# Patient Record
Sex: Male | Born: 1950
Health system: Southern US, Community
[De-identification: ages and names within clinical notes are randomized; demographics above are authoritative.]

## PROBLEM LIST (undated history)

## (undated) DIAGNOSIS — C679 Malignant neoplasm of bladder, unspecified: Secondary | ICD-10-CM

## (undated) DIAGNOSIS — I10 Essential (primary) hypertension: Secondary | ICD-10-CM

## (undated) DIAGNOSIS — F32A Depression, unspecified: Secondary | ICD-10-CM

## (undated) DIAGNOSIS — F419 Anxiety disorder, unspecified: Secondary | ICD-10-CM

## (undated) DIAGNOSIS — C801 Malignant (primary) neoplasm, unspecified: Secondary | ICD-10-CM

## (undated) DIAGNOSIS — F329 Major depressive disorder, single episode, unspecified: Secondary | ICD-10-CM

## (undated) HISTORY — PX: CYST EXCISION: SHX5701

## (undated) HISTORY — PX: ADRENALECTOMY: SHX876

## (undated) HISTORY — DX: Malignant neoplasm of bladder, unspecified: C67.9

---
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
## 1999-10-02 HISTORY — PX: CHOLECYSTECTOMY: SHX55

## 2001-03-19 ENCOUNTER — Ambulatory Visit (HOSPITAL_COMMUNITY): Admission: RE | Admit: 2001-03-19 | Discharge: 2001-03-19 | Payer: Self-pay | Admitting: Internal Medicine

## 2001-03-19 ENCOUNTER — Encounter: Payer: Self-pay | Admitting: Internal Medicine

## 2001-06-06 ENCOUNTER — Encounter: Payer: Self-pay | Admitting: Internal Medicine

## 2001-06-06 ENCOUNTER — Ambulatory Visit (HOSPITAL_COMMUNITY): Admission: RE | Admit: 2001-06-06 | Discharge: 2001-06-06 | Payer: Self-pay | Admitting: Internal Medicine

## 2002-08-20 ENCOUNTER — Ambulatory Visit (HOSPITAL_COMMUNITY): Admission: RE | Admit: 2002-08-20 | Discharge: 2002-08-20 | Payer: Self-pay | Admitting: Internal Medicine

## 2002-08-20 ENCOUNTER — Encounter: Payer: Self-pay | Admitting: Internal Medicine

## 2003-03-11 ENCOUNTER — Encounter: Payer: Self-pay | Admitting: Family Medicine

## 2003-03-11 ENCOUNTER — Ambulatory Visit (HOSPITAL_COMMUNITY): Admission: RE | Admit: 2003-03-11 | Discharge: 2003-03-11 | Payer: Self-pay | Admitting: Family Medicine

## 2003-10-07 ENCOUNTER — Ambulatory Visit (HOSPITAL_COMMUNITY): Admission: RE | Admit: 2003-10-07 | Discharge: 2003-10-07 | Payer: Self-pay | Admitting: Internal Medicine

## 2003-11-05 ENCOUNTER — Ambulatory Visit (HOSPITAL_COMMUNITY): Admission: RE | Admit: 2003-11-05 | Discharge: 2003-11-05 | Payer: Self-pay | Admitting: Neurology

## 2004-01-14 ENCOUNTER — Ambulatory Visit (HOSPITAL_COMMUNITY): Admission: RE | Admit: 2004-01-14 | Discharge: 2004-01-14 | Payer: Self-pay | Admitting: Urology

## 2004-03-12 ENCOUNTER — Emergency Department (HOSPITAL_COMMUNITY): Admission: EM | Admit: 2004-03-12 | Discharge: 2004-03-12 | Payer: Self-pay | Admitting: Emergency Medicine

## 2008-11-16 ENCOUNTER — Ambulatory Visit (HOSPITAL_COMMUNITY): Admission: RE | Admit: 2008-11-16 | Discharge: 2008-11-16 | Payer: Self-pay | Admitting: Internal Medicine

## 2009-03-07 ENCOUNTER — Ambulatory Visit (HOSPITAL_COMMUNITY): Admission: RE | Admit: 2009-03-07 | Discharge: 2009-03-07 | Payer: Self-pay | Admitting: Internal Medicine

## 2009-09-12 ENCOUNTER — Ambulatory Visit (HOSPITAL_COMMUNITY): Admission: RE | Admit: 2009-09-12 | Discharge: 2009-09-12 | Payer: Self-pay | Admitting: Urology

## 2009-10-01 HISTORY — PX: OTHER SURGICAL HISTORY: SHX169

## 2009-11-04 ENCOUNTER — Emergency Department (HOSPITAL_COMMUNITY): Admission: EM | Admit: 2009-11-04 | Discharge: 2009-11-04 | Payer: Self-pay | Admitting: Emergency Medicine

## 2010-03-23 ENCOUNTER — Ambulatory Visit (HOSPITAL_COMMUNITY): Admission: RE | Admit: 2010-03-23 | Discharge: 2010-03-23 | Payer: Self-pay | Admitting: Family Medicine

## 2010-04-05 ENCOUNTER — Ambulatory Visit (HOSPITAL_COMMUNITY): Admission: RE | Admit: 2010-04-05 | Discharge: 2010-04-05 | Payer: Self-pay | Admitting: Family Medicine

## 2010-04-12 ENCOUNTER — Ambulatory Visit (HOSPITAL_COMMUNITY): Admission: RE | Admit: 2010-04-12 | Discharge: 2010-04-12 | Payer: Self-pay | Admitting: Family Medicine

## 2010-04-18 ENCOUNTER — Ambulatory Visit (HOSPITAL_COMMUNITY): Admission: RE | Admit: 2010-04-18 | Discharge: 2010-04-18 | Payer: Self-pay | Admitting: Family Medicine

## 2010-05-11 ENCOUNTER — Ambulatory Visit (HOSPITAL_COMMUNITY): Admission: RE | Admit: 2010-05-11 | Discharge: 2010-05-11 | Payer: Self-pay | Admitting: Urology

## 2010-11-20 ENCOUNTER — Other Ambulatory Visit (HOSPITAL_COMMUNITY): Payer: Self-pay | Admitting: Urology

## 2010-11-20 DIAGNOSIS — E278 Other specified disorders of adrenal gland: Secondary | ICD-10-CM

## 2010-11-23 ENCOUNTER — Ambulatory Visit (HOSPITAL_COMMUNITY)
Admission: RE | Admit: 2010-11-23 | Discharge: 2010-11-23 | Disposition: A | Discharge status: Home / Self Care | Payer: Federal, State, Local not specified - PPO | Source: Ambulatory Visit | Attending: Urology | Admitting: Urology

## 2010-11-23 DIAGNOSIS — E278 Other specified disorders of adrenal gland: Secondary | ICD-10-CM

## 2010-11-23 DIAGNOSIS — N289 Disorder of kidney and ureter, unspecified: Secondary | ICD-10-CM | POA: Insufficient documentation

## 2010-11-23 DIAGNOSIS — E279 Disorder of adrenal gland, unspecified: Secondary | ICD-10-CM | POA: Insufficient documentation

## 2010-11-23 MED ORDER — IOHEXOL 300 MG/ML  SOLN
100.0000 mL | Freq: Once | INTRAMUSCULAR | Status: AC | PRN
  Administered 2010-11-23: 100 mL via INTRAVENOUS

## 2010-12-20 LAB — DIFFERENTIAL
Basophils Relative: 1 % (ref 0–1)
Eosinophils Absolute: 0.2 10*3/uL (ref 0.0–0.7)
Eosinophils Relative: 1 % (ref 0–5)
Lymphocytes Relative: 18 % (ref 12–46)
Lymphs Abs: 1.9 10*3/uL (ref 0.7–4.0)
Monocytes Absolute: 1 10*3/uL (ref 0.1–1.0)
Monocytes Relative: 10 % (ref 3–12)
Neutro Abs: 7.6 10*3/uL (ref 1.7–7.7)
Neutrophils Relative %: 71 % (ref 43–77)

## 2010-12-20 LAB — COMPREHENSIVE METABOLIC PANEL
Albumin: 3.8 g/dL (ref 3.5–5.2)
CO2: 27 mEq/L (ref 19–32)
Creatinine, Ser: 0.93 mg/dL (ref 0.4–1.5)
GFR calc non Af Amer: >60 mL/min (ref >60)
Sodium: 138 mEq/L (ref 135–145)

## 2010-12-20 LAB — URINALYSIS, ROUTINE W REFLEX MICROSCOPIC
Bilirubin Urine: NEGATIVE
Glucose, UA: NEGATIVE mg/dL
Urobilinogen, UA: 2 mg/dL — ABNORMAL HIGH (ref 0.0–1.0)

## 2010-12-20 LAB — CBC
HCT: 52.4 % — ABNORMAL HIGH (ref 39.0–52.0)
MCV: 94.4 fL (ref 78.0–100.0)
Platelets: 284 10*3/uL (ref 150–400)
RBC: 5.55 MIL/uL (ref 4.22–5.81)
RDW: 12.8 % (ref 11.5–15.5)

## 2010-12-20 LAB — URINE MICROSCOPIC-ADD ON

## 2010-12-20 LAB — URINE CULTURE
Colony Count: NO GROWTH
Culture: NO GROWTH

## 2011-10-24 ENCOUNTER — Telehealth: Payer: Self-pay

## 2011-10-24 NOTE — Telephone Encounter (Signed)
LMOM to call.

## 2011-10-30 NOTE — Telephone Encounter (Signed)
LMOM to call.

## 2011-10-30 NOTE — Telephone Encounter (Signed)
Letter mailed for pt to call.  

## 2011-10-31 ENCOUNTER — Other Ambulatory Visit: Payer: Self-pay

## 2011-10-31 ENCOUNTER — Telehealth: Payer: Self-pay

## 2011-10-31 DIAGNOSIS — Z139 Encounter for screening, unspecified: Secondary | ICD-10-CM

## 2011-10-31 NOTE — Telephone Encounter (Signed)
Gastroenterology Pre-Procedure Form  PT SAID HE HAD A COLONOSCOPY AT ABOUT AGE 61. DOES NOT KNOW WHY. IT WAS DONE BY DR. Chrissie Noa [facility]AFFORD IN [place] HE IS NOT HAVING ANY PROBLEMS NOW  Request Date: 10/31/2011        Requesting Physician: McGough     PATIENT INFORMATION:  Chad Butler is a 61 y.o., male (DOB=09-28-1951).  PROCEDURE: Procedure(s) requested: colonoscopy Procedure Reason: screening for colon cancer  PATIENT REVIEW QUESTIONS: The patient reports the following:   1. Diabetes Melitis: no 2. Joint replacements in the past 12 months: no 3. Major health problems in the past 3 months: no 4. Has an artificial valve or MVP:no 5. Has been advised in past to take antibiotics in advance of a procedure like teeth cleaning: no}    MEDICATIONS & ALLERGIES:    Patient reports the following regarding taking any blood thinners:   Plavix? no Aspirin?yes  Coumadin?  no  Patient confirms/reports the following medications:  Current Outpatient Prescriptions  Medication Sig Dispense Refill  . ALPRAZolam (XANAX) 0.5 MG tablet Take 0.5 mg by mouth at bedtime as needed. Pt said he only takes a tablet at bedtime nightly      . aspirin 81 MG tablet Take 81 mg by mouth daily.      . Multiple Vitamin (MULTIVITAMIN) tablet Take 1 tablet by mouth daily.        Patient confirms/reports the following allergies:  Allergies  Allergen Reactions  . Gadolinium      Code: VOM, Desc: n/v with injection, no other reactions noted, Onset Date: [id number]     Patient is appropriate to schedule for requested procedure(s): yes  AUTHORIZATION INFORMATION Primary Insurance:   ID #: Group #:  Pre-Cert / Auth required:  Pre-Cert / Auth #:   Secondary Insurance:   ID #:   Group #:  Pre-Cert / Auth required:  Pre-Cert / Auth #:   No orders of the defined types were placed in this encounter.    SCHEDULE INFORMATION: Procedure has been scheduled as follows:  Date: 11/09/2011                       Time: 12:30 PM  Location: [facility] Short Stay  This Gastroenterology Pre-Precedure Form is being routed to the following provider(s) for review: Jonette Eva, MD

## 2011-10-31 NOTE — Telephone Encounter (Deleted)
Gastroenterology Pre-Procedure Form   PT SAID HE HAD A COLONOSCOPY AT ABOUT AGE 61/ HAS NO IDEA WHY/ DR. Chrissie Noa STA[facility]ORD IN [place].  NOT HAVING ANY PROBLEMS   Request Date: 10/31/2011       Requesting Physician: Dr. Regino Schultze     PATIENT INFORMATION:  Chad Butler is a 61 y.o., male (DOB=02-19-1951).  PROCEDURE: Procedure(s) requested: colonoscopy Procedure Reason: screening for colon cancer  PATIENT REVIEW QUESTIONS: The patient reports the following:   1. Diabetes Melitis: no 2. Joint replacements in the past 12 months: no 3. Major health problems in the past 3 months: no 4. Has an artificial valve or MVP:no 5. Has been advised in past to take antibiotics in advance of a procedure like teeth cleaning: no}    MEDICATIONS & ALLERGIES:    Patient reports the following regarding taking any blood thinners:   Plavix? no Aspirin?yes  Coumadin?  no  Patient confirms/reports the following medications:  No current outpatient prescriptions on file.    Patient confirms/reports the following allergies:  Allergies  Allergen Reactions  . Gadolinium      Code: VOM, Desc: n/v with injection, no other reactions noted, Onset Date: [id number]     Patient is appropriate to schedule for requested procedure(s): yes  AUTHORIZATION INFORMATION Primary Insurance:   ID #:   Group #:  Pre-Cert / Auth required:  Pre-Cert / Auth #:   Secondary Insurance:   ID #:   Group #:  Pre-Cert / Auth required: Pre-Cert / Auth #:   No orders of the defined types were placed in this encounter.    SCHEDULE INFORMATION: Procedure has been scheduled as follows:  Date: 11/09/2011                    Time: 12:30 pm  Location:[facility] Short Stay  This Gastroenterology Pre-Precedure Form is being routed to the following provider(s) for review: Jonette Eva, MD

## 2011-11-01 NOTE — Telephone Encounter (Signed)
MOVI PREP SPLIT DOSING, REGULAR BREAKFAST. CLEAR LIQUIDS AFTER 9 AM.  

## 2011-11-01 NOTE — Telephone Encounter (Signed)
REVIEWED.  

## 2011-11-01 NOTE — Telephone Encounter (Signed)
Rx and instructions faxed to [facility] pharmacy. Pt aware!

## 2011-11-06 ENCOUNTER — Encounter (HOSPITAL_COMMUNITY): Payer: Self-pay | Admitting: Pharmacy Technician

## 2011-11-08 MED ORDER — SODIUM CHLORIDE 0.45 % IV SOLN
Freq: Once | INTRAVENOUS | Status: AC
  Administered 2011-11-09: 12:00:00 via INTRAVENOUS

## 2011-11-09 ENCOUNTER — Ambulatory Visit (HOSPITAL_COMMUNITY)
Admission: RE | Admit: 2011-11-09 | Discharge: 2011-11-09 | Disposition: A | Discharge status: Home / Self Care | Payer: Federal, State, Local not specified - PPO | Source: Ambulatory Visit | Attending: Gastroenterology | Admitting: Gastroenterology

## 2011-11-09 ENCOUNTER — Encounter (HOSPITAL_COMMUNITY)
Admission: RE | Disposition: A | Discharge status: Home / Self Care | Payer: Self-pay | Source: Ambulatory Visit | Attending: Gastroenterology

## 2011-11-09 ENCOUNTER — Other Ambulatory Visit: Payer: Self-pay | Admitting: Gastroenterology

## 2011-11-09 ENCOUNTER — Encounter (HOSPITAL_COMMUNITY): Payer: Self-pay | Admitting: *Deleted

## 2011-11-09 DIAGNOSIS — D126 Benign neoplasm of colon, unspecified: Secondary | ICD-10-CM

## 2011-11-09 DIAGNOSIS — K648 Other hemorrhoids: Secondary | ICD-10-CM

## 2011-11-09 DIAGNOSIS — K5730 Diverticulosis of large intestine without perforation or abscess without bleeding: Secondary | ICD-10-CM

## 2011-11-09 DIAGNOSIS — Z139 Encounter for screening, unspecified: Secondary | ICD-10-CM

## 2011-11-09 DIAGNOSIS — Z1211 Encounter for screening for malignant neoplasm of colon: Secondary | ICD-10-CM | POA: Insufficient documentation

## 2011-11-09 DIAGNOSIS — Z7982 Long term (current) use of aspirin: Secondary | ICD-10-CM | POA: Insufficient documentation

## 2011-11-09 HISTORY — PX: COLONOSCOPY: SHX5424

## 2011-11-09 HISTORY — DX: Anxiety disorder, unspecified: F41.9

## 2011-11-09 SURGERY — COLONOSCOPY
Anesthesia: Moderate Sedation

## 2011-11-09 MED ORDER — MEPERIDINE HCL 100 MG/ML IJ SOLN
INTRAMUSCULAR | Status: AC
  Filled 2011-11-09: qty 1

## 2011-11-09 MED ORDER — STERILE WATER FOR IRRIGATION IR SOLN
Status: DC | PRN
  Administered 2011-11-09: 12:00:00

## 2011-11-09 MED ORDER — MEPERIDINE HCL 100 MG/ML IJ SOLN
INTRAMUSCULAR | Status: DC | PRN
  Administered 2011-11-09: 50 mg via INTRAVENOUS
  Administered 2011-11-09: 25 mg via INTRAVENOUS

## 2011-11-09 MED ORDER — MIDAZOLAM HCL 5 MG/5ML IJ SOLN
INTRAMUSCULAR | Status: AC
  Filled 2011-11-09: qty 10

## 2011-11-09 MED ORDER — MIDAZOLAM HCL 5 MG/5ML IJ SOLN
INTRAMUSCULAR | Status: DC | PRN
  Administered 2011-11-09 (×2): 2 mg via INTRAVENOUS

## 2011-11-09 NOTE — Op Note (Signed)
[facility] [address] [place], [place]  [address]  COLONOSCOPY PROCEDURE REPORT  PATIENT:  Chad Butler, Chad Butler  MR#:  [id number] BIRTHDATE:  August 03, 1951, 60 yrs. old  GENDER:  male  ENDOSCOPIST:  Jonette Eva, MD REF. BY:  Artis Delay, M.D. ASSISTANT:  PROCEDURE DATE:  11/09/2011 PROCEDURE:  Colonoscopy with biopsy  INDICATIONS:  SCREENING  MEDICATIONS:   Demerol 75 mg IV, Versed 4 mg IV  DESCRIPTION OF PROCEDURE:    Physical exam was performed. Informed consent was obtained from the patient after explaining the benefits, risks, and alternatives to procedure.  The patient was connected to monitor and placed in left lateral position. Continuous oxygen was provided by nasal cannula and IV medicine administered through an indwelling cannula.  After administration of sedation and rectal exam, the patient's rectum was intubated and the EC-[id number] ([id number]) colonoscope was advanced under direct visualization to the cecum.  The scope was removed slowly by carefully examining the color, texture, anatomy, and integrity mucosa on the way out.  The patient was recovered in endoscopy and discharged home in satisfactory condition. <<PROCEDUREIMAGES>>  FINDINGS:  A 3 MM sessile polyp was found in the cecum & REMOVED VIA COLD FORCEPS.  A 4 MM SESSILE polyp was found at the hepatic flexure & REOVED VIA COLD FORCEPS.  RARE Diverticula were found in the sigmoid colon.  SMALL Internal Hemorrhoids were found.  PREP QUALITY: EXCELLENT CECAL W/D TIME:     13 minutes  COMPLICATIONS:    None  ENDOSCOPIC IMPRESSION: 1) Sessile polyp in the cecum 2) Sessile polyp at the hepatic flexure 3) MLD DiverticulOSIS in the sigmoid colon 4) Internal hemorrhoids  RECOMMENDATIONS: TCS IN 10 YEARS HIGH FIBER DIET AWAIT BIOPSIES  REPEAT EXAM:  No  ______________________________ Jonette Eva, MD  CC:  Artis Delay, M.D.  n. eSIGNED:   Jalena Vanderlinden at 11/09/2011 12:47 PM  [place],  [phone]

## 2011-11-09 NOTE — H&P (Signed)
  Primary Care Physician:  Cassell Smiles., MD, MD Primary Gastroenterologist:  Dr. Darrick Penna  Pre-Procedure History & Physical: HPI:  Chad Butler is a 61 y.o. male here for COLON CANCER SCREENING.   No past medical history on file.  No past surgical history on file.  Prior to Admission medications   Medication Sig Start Date End Date Taking? Authorizing Provider  aspirin EC 81 MG tablet Take 81 mg by mouth daily.   Yes Historical Provider, MD  Multiple Vitamin (MULITIVITAMIN WITH MINERALS) TABS Take 1 tablet by mouth daily.   Yes Historical Provider, MD  ALPRAZolam Prudy Feeler) 0.5 MG tablet Take 0.5 mg by mouth at bedtime as needed. For sleep.    Historical Provider, MD    Allergies as of 10/31/2011 - Review Complete 10/31/2011  Allergen Reaction Noted  . Gadolinium  04/18/2010   NO COLON CA but father had POLYPS  History   Social History  . Marital Status: Married    Spouse Name: N/A    Number of Children: N/A  . Years of Education: N/A   Occupational History  . Not on file.   Social History Main Topics  . Smoking status: Not on file  . Smokeless tobacco: Not on file  . Alcohol Use: Not on file  . Drug Use: Not on file  . Sexually Active: Not on file   Other Topics Concern  . Not on file   Social History Narrative  . No narrative on file    Review of Systems: See HPI, otherwise negative ROS   Physical Exam: There were no vitals taken for this visit. General:   Alert,  pleasant and cooperative in NAD Head:  Normocephalic and atraumatic. Neck:  Supple;  Lungs:  Clear throughout to auscultation.    Heart:  Regular rate and rhythm. Abdomen:  Soft, nontender and nondistended. Normal bowel sounds, without guarding, and without rebound.   Neurologic:  Alert and  oriented x4;  grossly normal neurologically.  Impression/Plan:     AVERAGE RISK  PLAN: TCS TODAY

## 2011-11-15 ENCOUNTER — Telehealth: Payer: Self-pay | Admitting: Gastroenterology

## 2011-11-15 NOTE — Telephone Encounter (Signed)
LMOM to call.

## 2011-11-15 NOTE — Telephone Encounter (Signed)
Please call pt. He had simple adenomas removed from hIS colon. TCS in 10 years. FOLLOW A High fiber diet.

## 2011-11-15 NOTE — Telephone Encounter (Signed)
Results Cc to PCP an 10yr reminder is in the computer 

## 2011-11-16 ENCOUNTER — Encounter (HOSPITAL_COMMUNITY): Payer: Self-pay | Admitting: Gastroenterology

## 2011-11-16 NOTE — Telephone Encounter (Signed)
Pt returned call and was informed. High Fiber Diet mailed to him per request.

## 2012-07-16 ENCOUNTER — Other Ambulatory Visit: Payer: Self-pay | Admitting: Physician Assistant

## 2012-07-16 ENCOUNTER — Ambulatory Visit (HOSPITAL_COMMUNITY)
Admission: RE | Admit: 2012-07-16 | Discharge: 2012-07-16 | Disposition: A | Discharge status: Home / Self Care | Payer: Federal, State, Local not specified - PPO | Source: Ambulatory Visit | Attending: Physician Assistant | Admitting: Physician Assistant

## 2012-07-16 DIAGNOSIS — R05 Cough: Secondary | ICD-10-CM

## 2012-07-16 DIAGNOSIS — R059 Cough, unspecified: Secondary | ICD-10-CM | POA: Insufficient documentation

## 2012-07-16 DIAGNOSIS — R079 Chest pain, unspecified: Secondary | ICD-10-CM | POA: Insufficient documentation

## 2012-09-30 NOTE — H&P (Signed)
  NTS SOAP Note  Vital Signs:  Vitals as of: 09/30/2012: Systolic 175: Diastolic 110: Heart Rate 85: Temp 98.23F: Height 42ft 1in: Weight 164Lbs 0 Ounces: Pain Level 4: BMI 22  BMI : 21.64 kg/m2  Subjective: This 61 Years 63 Months old Male presents for of indigestion.  States he has been having coughing episodes for several months now.  Also has been having epigastric pain and emesis.  Not associated with food.  s/p lap cholecystectomy many years ago.  No fever, chills, jaundice.  States he has swelling over right hip, concerned as to whether this is a hernia, s/p right inguinal herniorrhaphy in remote past, denies any recent right groin pain.  Review of Symptoms:  Constitutional:unremarkable   Head:unremarkable    Eyes:unremarkable   Nose/Mouth/Throat:unremarkable Cardiovascular:  unremarkable   Respiratory:  cough Gastrointestin    abdominal pain,heartburn Genitourinary:unremarkable     Musculoskeletal:unremarkable   neurofibromas Hematolgic/Lymphatic:unremarkable     Allergic/Immunologic:unremarkable     Past Medical History:    Reviewed   Past Medical History  Surgical History: lap chole, right inguinal herniorrhapy, tcs within last ten years Medical Problems: neurofibromatosis Allergies: nkda Medications: albuterol, xanax, baby asa   Social History:Reviewed  Social History  Preferred Language: English Ethnicity: Not Hispanic / Latino Age: 61 Years 8 Months Marital Status:  M Alcohol: socially Recreational drug(s):  No   Smoking Status: Never smoker reviewed on 09/30/2012 Functional Status reviewed on mm/dd/yyyy ------------------------------------------------ Bathing: Normal Cooking: Normal Dressing: Normal Driving: Normal Eating: Normal Managing Meds: Normal Oral Care: Normal Shopping: Normal Toileting: Normal Transferring: Normal Walking: Normal Cognitive Status reviewed on  mm/dd/yyyy ------------------------------------------------ Attention: Normal Decision Making: Normal Language: Normal Memory: Normal Motor: Normal Perception: Normal Problem Solving: Normal Visual and Spatial: Normal   Family History:  Reviewed   Family History  Is there a family history [id number]    Objective Information: General:  Well appearing, well nourished in no distress.      multiple fibromas Neck:  Supple without lymphadenopathy.  Heart:  RRR, no murmur Lungs:    CTA bilaterally, no wheezes, rhonchi, rales.  Breathing unlabored. Abdomen:Soft, NT/ND, no HSM, no masses.  No discrete right inguinal hernia.  Swelling noted from right pelvic creast inferior down right lateral thigh, subcutaneous in nature.  Assessment:Epigastric pain, emesis  No evidence of significant recurrant right inguinal hernia  Diagnosis &amp; Procedure:    Plan:Scheduled for EGD on 10/07/12.   Patient Education:Alternative treatments to surgery were discussed with patient (and family).  Risks and benefits  of procedure were fully explained to the patient (and family) who gave informed consent. Patient/family questions were addressed.  Follow-up:Pending Surgery                           Active Diagnosis and Procedures: 789.06 Abdominal pain, epigastric   99203 - OFFICE OUTPATIENT NEW 30 MINUTES

## 2012-10-02 ENCOUNTER — Encounter (HOSPITAL_COMMUNITY): Payer: Self-pay

## 2012-10-07 ENCOUNTER — Encounter (HOSPITAL_COMMUNITY)
Admission: RE | Disposition: A | Discharge status: Home / Self Care | Payer: Self-pay | Source: Ambulatory Visit | Attending: General Surgery

## 2012-10-07 ENCOUNTER — Encounter (HOSPITAL_COMMUNITY): Payer: Self-pay | Admitting: *Deleted

## 2012-10-07 ENCOUNTER — Ambulatory Visit (HOSPITAL_COMMUNITY)
Admission: RE | Admit: 2012-10-07 | Discharge: 2012-10-07 | Disposition: A | Discharge status: Home / Self Care | Payer: Federal, State, Local not specified - PPO | Source: Ambulatory Visit | Attending: General Surgery | Admitting: General Surgery

## 2012-10-07 DIAGNOSIS — K219 Gastro-esophageal reflux disease without esophagitis: Secondary | ICD-10-CM | POA: Insufficient documentation

## 2012-10-07 HISTORY — PX: ESOPHAGOGASTRODUODENOSCOPY: SHX5428

## 2012-10-07 SURGERY — EGD (ESOPHAGOGASTRODUODENOSCOPY)
Anesthesia: Moderate Sedation

## 2012-10-07 MED ORDER — MIDAZOLAM HCL 5 MG/5ML IJ SOLN
INTRAMUSCULAR | Status: AC
  Filled 2012-10-07: qty 10

## 2012-10-07 MED ORDER — MEPERIDINE HCL 50 MG/ML IJ SOLN
INTRAMUSCULAR | Status: AC
  Filled 2012-10-07: qty 2

## 2012-10-07 MED ORDER — BUTAMBEN-TETRACAINE-BENZOCAINE 2-2-14 % EX AERO
INHALATION_SPRAY | CUTANEOUS | Status: DC | PRN
  Administered 2012-10-07: 3 via TOPICAL

## 2012-10-07 MED ORDER — SODIUM CHLORIDE 0.45 % IV SOLN
INTRAVENOUS | Status: DC
  Administered 2012-10-07: 08:00:00 via INTRAVENOUS

## 2012-10-07 MED ORDER — MIDAZOLAM HCL 5 MG/5ML IJ SOLN
INTRAMUSCULAR | Status: DC | PRN
  Administered 2012-10-07: 3 mg via INTRAVENOUS

## 2012-10-07 MED ORDER — MEPERIDINE HCL 25 MG/ML IJ SOLN
INTRAMUSCULAR | Status: DC | PRN
  Administered 2012-10-07: 50 mg via INTRAVENOUS

## 2012-10-07 MED ORDER — STERILE WATER FOR IRRIGATION IR SOLN
Status: DC | PRN
  Administered 2012-10-07: 08:00:00

## 2012-10-07 NOTE — Op Note (Signed)
[facility] [address] [place], [address]   ENDOSCOPY PROCEDURE REPORT  PATIENT: Chad Butler, Chad Butler  MR#: [id number] BIRTHDATE: 09/29/1951 , 61  yrs. old GENDER: Male ENDOSCOPIST: Franky Macho, MD REFERRED BY:  Artis Delay PROCEDURE DATE:  10/07/2012 PROCEDURE:  EGD w/ biopsy for H.pylori ASA CLASS:     Class II INDICATIONS:  Dysphagia. MEDICATIONS: Demerol 50 mg IV and Versed 3 mg IV TOPICAL ANESTHETIC: Cetacaine Spray  DESCRIPTION OF PROCEDURE: After the risks benefits and alternatives of the procedure were thoroughly explained, informed consent was obtained.  The [id number] ([id number]) endoscope was introduced through the mouth and advanced to the second portion of the duodenum. Without limitations.  The instrument was slowly withdrawn as the mucosa was fully examined.      The upper, middle and distal third of the esophagus were carefully inspected and no abnormalities were noted.  The z-line was well seen at the GEJ.  The endoscope was pushed into the fundus which was normal including a retroflexed view.  The antrum, gastric body, first and second part of the duodenum were unremarkable.  A biopsy was performed.  Retroflexed views revealed no abnormalities. The scope was then withdrawn from the patient and the procedure completed.  COMPLICATIONS: There were no complications. ENDOSCOPIC IMPRESSION: Normal EGD; biopsy  RECOMMENDATIONS: Await biopsy results for Clo test.  Do not appreciate any upper gi problems that could explain his chronic cough and resulting emesis.  REPEAT EXAM:  eSigned:  Franky Macho, MD 10/07/2012 8:48 AM   CC:

## 2012-10-07 NOTE — Interval H&P Note (Signed)
History and Physical Interval Note:  10/07/2012 8:06 AM  Chad Butler  has presented today for surgery, with the diagnosis of GERD gastroesophageal reflux disease 530.81  The various methods of treatment have been discussed with the patient and family. After consideration of risks, benefits and other options for treatment, the patient has consented to  Procedure(s) (LRB) with comments: ESOPHAGOGASTRODUODENOSCOPY (EGD) (N/A) as a surgical intervention .  The patient's history has been reviewed, patient examined, no change in status, stable for surgery.  I have reviewed the patient's chart and labs.  Questions were answered to the patient's satisfaction.     [name]

## 2012-10-09 ENCOUNTER — Encounter (HOSPITAL_COMMUNITY): Payer: Self-pay | Admitting: General Surgery

## 2012-11-14 ENCOUNTER — Emergency Department (HOSPITAL_COMMUNITY)
Admission: EM | Admit: 2012-11-14 | Discharge: 2012-11-14 | Disposition: A | Discharge status: Home / Self Care | Payer: Federal, State, Local not specified - PPO | Attending: Emergency Medicine | Admitting: Emergency Medicine

## 2012-11-14 ENCOUNTER — Encounter (HOSPITAL_COMMUNITY): Payer: Self-pay | Admitting: Emergency Medicine

## 2012-11-14 DIAGNOSIS — Z7982 Long term (current) use of aspirin: Secondary | ICD-10-CM | POA: Insufficient documentation

## 2012-11-14 DIAGNOSIS — W1809XA Striking against other object with subsequent fall, initial encounter: Secondary | ICD-10-CM | POA: Insufficient documentation

## 2012-11-14 DIAGNOSIS — F411 Generalized anxiety disorder: Secondary | ICD-10-CM | POA: Insufficient documentation

## 2012-11-14 DIAGNOSIS — S39012A Strain of muscle, fascia and tendon of lower back, initial encounter: Secondary | ICD-10-CM

## 2012-11-14 DIAGNOSIS — Z8679 Personal history of other diseases of the circulatory system: Secondary | ICD-10-CM | POA: Insufficient documentation

## 2012-11-14 DIAGNOSIS — S335XXA Sprain of ligaments of lumbar spine, initial encounter: Secondary | ICD-10-CM | POA: Insufficient documentation

## 2012-11-14 DIAGNOSIS — Y929 Unspecified place or not applicable: Secondary | ICD-10-CM | POA: Insufficient documentation

## 2012-11-14 DIAGNOSIS — S0990XA Unspecified injury of head, initial encounter: Secondary | ICD-10-CM | POA: Insufficient documentation

## 2012-11-14 DIAGNOSIS — Y9329 Activity, other involving ice and snow: Secondary | ICD-10-CM | POA: Insufficient documentation

## 2012-11-14 DIAGNOSIS — Z79899 Other long term (current) drug therapy: Secondary | ICD-10-CM | POA: Insufficient documentation

## 2012-11-14 DIAGNOSIS — W010XXA Fall on same level from slipping, tripping and stumbling without subsequent striking against object, initial encounter: Secondary | ICD-10-CM | POA: Insufficient documentation

## 2012-11-14 NOTE — ED Provider Notes (Signed)
History     CSN: [id number]  Arrival date & time 11/14/12  4098   First MD Initiated Contact with Patient 11/14/12 [id number]      Chief Complaint  Patient presents with  . Fall  . Head Injury  . Back Pain    (Consider location/radiation/quality/duration/timing/severity/associated sxs/prior treatment) HPI 62 year old male stepped out of his car this morning at a [employer] parking lot to get some breakfast when he slipped on the ice and fell and hit the back of his head. He has no amnesia for the event no loss of consciousness. He only has a very minor headache. He is no severe headache. He is no change in speech vision swallowing or understanding. He is no weakness numbness or incoordination. He is no neck pain. He is no midline back pain. He does have some mild left paralumbar low back pain. He is no chest wall pain. He is no shortness breath abdominal pain. He is no injury or pain weakness numbness to his extremities or change in bowel or bladder function. He is walking normally. He does want to be checked out to make sure he does not need for emergency admission. His pain is mild. He does not require pain medicines at this time. There is no treatment prior to arrival. The patient does not take blood thinners. Past Medical History  Diagnosis Date  . Anxiety   . Hemorrhoids     Past Surgical History  Procedure Laterality Date  . Cholecystectomy  2001  . Left shoulder surgery  2011  . Hernia repair  2000    and as a child  . Colonoscopy  11/09/2011    Procedure: COLONOSCOPY;  Surgeon: Arlyce Harman, MD;  Location: [facility];  Service: Endoscopy;  Laterality: N/A;  12:30 PM  . Esophagogastroduodenoscopy  10/07/2012    Procedure: ESOPHAGOGASTRODUODENOSCOPY (EGD);  Surgeon: Dalia Heading, MD;  Location: [facility];  Service: Gastroenterology;  Laterality: N/A;    Family History  Problem Relation Age of Onset  . Colon cancer Neg Hx     History  Substance Use Topics  . Smoking  status: Never Smoker   . Smokeless tobacco: Not on file  . Alcohol Use: 4.2 oz/week    7 Glasses of wine per week      Review of Systems 10 Systems reviewed and are negative for acute change except as noted in the HPI. Allergies  Gadolinium  Home Medications   Current Outpatient Rx  Name  Route  Sig  Dispense  Refill  . ALPRAZolam (XANAX) 0.5 MG tablet   Oral   Take 0.5 mg by mouth at bedtime as needed. For sleep.         Marland Kitchen aspirin EC 81 MG tablet   Oral   Take 81 mg by mouth daily.         . Multiple Vitamin (MULITIVITAMIN WITH MINERALS) TABS   Oral   Take 1 tablet by mouth daily.           BP 168/88  Pulse 96  Temp(Src) 97.4 F (36.3 C) (Oral)  Resp 18  Ht 6\' 1"  (1.854 m)  Wt 160 lb (72.576 kg)  BMI 21.11 kg/m2  SpO2 100%  Physical Exam  Nursing note and vitals reviewed. Constitutional:  Awake, alert, nontoxic appearance with baseline speech for patient.  HENT:  Head: Atraumatic.  Mouth/Throat: No oropharyngeal exudate.  Eyes: EOM are normal. Pupils are equal, round, and reactive to light. Right eye exhibits no  discharge. Left eye exhibits no discharge.  Neck: Neck supple.  Neck has no posterior tenderness in the midline or paracervical regions.  Cardiovascular: Normal rate and regular rhythm.   No murmur heard. Pulmonary/Chest: Effort normal and breath sounds normal. No stridor. No respiratory distress. He has no wheezes. He has no rales. He exhibits no tenderness.  Abdominal: Soft. Bowel sounds are normal. He exhibits no mass. There is no tenderness. There is no rebound.  Musculoskeletal: He exhibits no edema and no tenderness.  Baseline ROM, moves extremities with no obvious new focal weakness. Cervical spine and back are nontender in the midline. He has minimal left paralumbar soft tissue tenderness with no CVA tenderness.  Lymphadenopathy:    He has no cervical adenopathy.  Neurological: He is alert.  Awake, alert, cooperative and aware of  situation; motor strength bilaterally; sensation normal to light touch bilaterally; peripheral visual fields full to confrontation; no facial asymmetry; tongue midline; major cranial nerves appear intact; no pronator drift, normal finger to nose bilaterally, baseline gait without new ataxia.  Skin: No rash noted.  Psychiatric: He has a normal mood and affect.    ED Course  Procedures (including critical care time) I do not think CT scan of the head or imaging of the spine are mandatory at this time the patient understands and agrees with this assessment and plan as well. Labs Reviewed - No data to display No results found.   1. Minor head injury without loss of consciousness, initial encounter   2. Lumbar strain, initial encounter       MDM  I doubt any other EMC precluding discharge at this time including, but not necessarily limited to the following:ICH, CSI.        Hurman Horn, MD 11/24/12 [phone]

## 2012-11-14 NOTE — ED Notes (Signed)
Pt slipped on ice hitting back of head on pavement. Pt also complaining of back pain.denies loc. Abrasion noted to back of head.

## 2013-07-04 ENCOUNTER — Encounter (HOSPITAL_COMMUNITY): Payer: Self-pay | Admitting: *Deleted

## 2013-07-04 ENCOUNTER — Emergency Department (HOSPITAL_COMMUNITY): Payer: Federal, State, Local not specified - PPO

## 2013-07-04 ENCOUNTER — Emergency Department (HOSPITAL_COMMUNITY)
Admission: EM | Admit: 2013-07-04 | Discharge: 2013-07-04 | Disposition: A | Discharge status: Home / Self Care | Payer: Federal, State, Local not specified - PPO | Attending: Emergency Medicine | Admitting: Emergency Medicine

## 2013-07-04 DIAGNOSIS — Z7982 Long term (current) use of aspirin: Secondary | ICD-10-CM | POA: Insufficient documentation

## 2013-07-04 DIAGNOSIS — Z79899 Other long term (current) drug therapy: Secondary | ICD-10-CM | POA: Insufficient documentation

## 2013-07-04 DIAGNOSIS — W268XXA Contact with other sharp object(s), not elsewhere classified, initial encounter: Secondary | ICD-10-CM | POA: Insufficient documentation

## 2013-07-04 DIAGNOSIS — W230XXA Caught, crushed, jammed, or pinched between moving objects, initial encounter: Secondary | ICD-10-CM | POA: Insufficient documentation

## 2013-07-04 DIAGNOSIS — Y9389 Activity, other specified: Secondary | ICD-10-CM | POA: Insufficient documentation

## 2013-07-04 DIAGNOSIS — S61219A Laceration without foreign body of unspecified finger without damage to nail, initial encounter: Secondary | ICD-10-CM

## 2013-07-04 DIAGNOSIS — Z8679 Personal history of other diseases of the circulatory system: Secondary | ICD-10-CM | POA: Insufficient documentation

## 2013-07-04 DIAGNOSIS — F411 Generalized anxiety disorder: Secondary | ICD-10-CM | POA: Insufficient documentation

## 2013-07-04 DIAGNOSIS — S61209A Unspecified open wound of unspecified finger without damage to nail, initial encounter: Secondary | ICD-10-CM | POA: Insufficient documentation

## 2013-07-04 DIAGNOSIS — Z792 Long term (current) use of antibiotics: Secondary | ICD-10-CM | POA: Insufficient documentation

## 2013-07-04 DIAGNOSIS — Y929 Unspecified place or not applicable: Secondary | ICD-10-CM | POA: Insufficient documentation

## 2013-07-04 MED ORDER — LIDOCAINE HCL (PF) 2 % IJ SOLN
INTRAMUSCULAR | Status: AC
  Filled 2013-07-04: qty 10

## 2013-07-04 MED ORDER — OXYCODONE-ACETAMINOPHEN 5-325 MG PO TABS: 1.0000 | ORAL_TABLET | ORAL | Status: DC | PRN

## 2013-07-04 NOTE — ED Notes (Signed)
Finger dressed with Vaseline dressing, tube gauze, and finger splint applied per EDPa's instruction.

## 2013-07-04 NOTE — ED Provider Notes (Signed)
CSN: [id number]     Arrival date & time 07/04/13  1600 History   First MD Initiated Contact with Patient 07/04/13 1642     Chief Complaint  Patient presents with  . Finger Injury   (Consider location/radiation/quality/duration/timing/severity/associated sxs/prior Treatment) HPI Comments: Chad Butler is a 62 y.o.  Right handed male presenting with pain and laceration to his right fourth finger after sustaining a crush injury between a trailer hitch and a tractor while splitting wood just prior to arrival.  He has sustained a subcutaneous laceration across his dorsal PIP joint and a superficial linear laceration on the medial aspect of the same joint.  He has sensation distal to the injury site.  Pain is worse with palpation and range of motion.  He has applied pressure to the wounds and has sustained hemostasis.  He is up-to-date on his tetanus.     The history is provided by the patient.    Past Medical History  Diagnosis Date  . Anxiety   . Hemorrhoids    Past Surgical History  Procedure Laterality Date  . Cholecystectomy  2001  . Left shoulder surgery  2011  . Hernia repair  2000    and as a child  . Colonoscopy  11/09/2011    Procedure: COLONOSCOPY;  Surgeon: Arlyce Harman, MD;  Location: [facility];  Service: Endoscopy;  Laterality: N/A;  12:30 PM  . Esophagogastroduodenoscopy  10/07/2012    Procedure: ESOPHAGOGASTRODUODENOSCOPY (EGD);  Surgeon: Dalia Heading, MD;  Location: [facility];  Service: Gastroenterology;  Laterality: N/A;   Family History  Problem Relation Age of Onset  . Colon cancer Neg Hx    History  Substance Use Topics  . Smoking status: Never Smoker   . Smokeless tobacco: Not on file  . Alcohol Use: 4.2 oz/week    7 Glasses of wine per week    Review of Systems  Constitutional: Negative for fever.  Musculoskeletal: Positive for joint swelling and arthralgias. Negative for myalgias.  Skin: Positive for wound.  Neurological: Negative for  weakness and numbness.    Allergies  Gadolinium  Home Medications   Current Outpatient Rx  Name  Route  Sig  Dispense  Refill  . ALPRAZolam (XANAX) 0.5 MG tablet   Oral   Take 0.5 mg by mouth at bedtime as needed. For sleep.         Marland Kitchen amoxicillin (AMOXIL) 500 MG capsule   Oral   Take 500 mg by mouth 3 (three) times daily.          Marland Kitchen aspirin EC 81 MG tablet   Oral   Take 81 mg by mouth daily.         . chlorhexidine (PERIDEX) 0.12 % solution   Mouth/Throat   Use as directed 15 mLs in the mouth or throat 2 (two) times daily.          Marland Kitchen HYDROcodone-acetaminophen (NORCO/VICODIN) 5-325 MG per tablet   Oral   Take 1 tablet by mouth every 4 (four) hours as needed for pain.          . Multiple Vitamin (MULITIVITAMIN WITH MINERALS) TABS   Oral   Take 1 tablet by mouth daily.         Marland Kitchen oxyCODONE-acetaminophen (ROXICET) 5-325 MG per tablet   Oral   Take 1 tablet by mouth every 4 (four) hours as needed for pain.   20 tablet   0    BP 142/83  Pulse 78  Temp(Src) 98 F (36.7 C) (Oral)  Resp 16  Ht 6' (1.829 m)  Wt 165 lb (74.844 kg)  BMI 22.37 kg/m2  SpO2 99% Physical Exam  Constitutional: He is oriented to person, place, and time. He appears well-developed and well-nourished.  HENT:  Head: Normocephalic.  Cardiovascular: Normal rate.   Pulmonary/Chest: Effort normal.  Musculoskeletal: He exhibits tenderness.       Hands: Neurological: He is alert and oriented to person, place, and time. No sensory deficit.  Skin: Laceration noted.    ED Course  Procedures (including critical care time)  LACERATION REPAIR Performed by: Burgess Amor Authorized by: Burgess Amor Consent: Verbal consent obtained. Risks and benefits: risks, benefits and alternatives were discussed Consent given by: patient Patient identity confirmed: provided demographic data Prepped and Draped in normal sterile fashion Wound explored  Laceration Location: right 4th  finger  Laceration Length: 2 cm and 1 cm (volar)  No Foreign Bodies seen or palpated  Anesthesia: digital block  Local anesthetic: lidocaine 2% without epinephrine  Anesthetic total: 2 ml  Irrigation method: syringe Amount of cleaning: standard  Skin closure: ethilon 4-0  Number of sutures: #5 on dorsal,  #3 on volar finger  Technique: simple interrupted  Patient tolerance: Patient tolerated the procedure well with no immediate complications.  Labs Review Labs Reviewed - No data to display Imaging Review Dg Hand Complete Right  07/04/2013   CLINICAL DATA:  Crush injury to 4th finger.  EXAM: RIGHT HAND - COMPLETE 3+ VIEW  COMPARISON:  None.  FINDINGS: There is no evidence of fracture or dislocation. There is no evidence of arthropathy or other focal bone abnormality. Soft tissue swelling present of the 4th digit centered around the PIP joint. No soft tissue foreign body is identified.  IMPRESSION: No evidence of fracture.   Electronically Signed   By: Irish Lack M.D.   On: 07/04/2013 17:37    MDM   1. Laceration of finger of right hand, initial encounter    Patients labs and/or radiological studies were viewed and considered during the medical decision making and disposition process. Pt was prescribed oxycodone prn pain.  Wound care instructions given.  Pt advised to have sutures removed in 10 days,  Return here sooner for any signs of infection including redness, swelling, worse pain or drainage of pus.       Burgess Amor, PA-C 07/04/13 [phone]

## 2013-07-04 NOTE — ED Notes (Signed)
Smashed R ring finger between tongue of trailer and back of tractor while splitting wood today. Laceration along knuckle and 2nd lac on medial edge.

## 2013-07-05 NOTE — ED Provider Notes (Signed)
Medical screening examination/treatment/procedure(s) were performed by non-physician practitioner and as supervising physician I was immediately available for consultation/collaboration.  Donnetta Hutching, MD 07/05/13 [phone]

## 2013-10-27 ENCOUNTER — Encounter (HOSPITAL_COMMUNITY): Payer: Self-pay | Admitting: Pharmacy Technician

## 2013-10-28 NOTE — Patient Instructions (Signed)
[facility]  10/28/2013   Your procedure is scheduled on:  11/04/13  Report to [facility] at 0830 AM.  Call this number if you have problems the morning of surgery: [phone]   Remember:   Do not eat food or drink liquids after midnight.   Take these medicines the morning of surgery with A SIP OF WATER: xanax   Do not wear jewelry, make-up or nail polish.  Do not wear lotions, powders, or perfumes. You may wear deodorant.  Do not shave 48 hours prior to surgery. Men may shave face and neck.  Do not bring valuables to the hospital.  [facility] is not responsible                  for any belongings or valuables.               Contacts, dentures or bridgework may not be worn into surgery.  Leave suitcase in the car. After surgery it may be brought to your room.  For patients admitted to the hospital, discharge time is determined by your                treatment team.               Patients discharged the day of surgery will not be allowed to drive  home.  Name and phone number of your driver:   Special Instructions: Shower using CHG 2 nights before surgery and the night before surgery.  If you shower the day of surgery use CHG.  Use special wash - you have one bottle of CHG for all showers.  You should use approximately 1/3 of the bottle for each shower.   Please read over the following fact sheets that you were given: Pain Booklet, Surgical Site Infection Prevention and Anesthesia Post-op Instructions   PATIENT INSTRUCTIONS POST-ANESTHESIA  IMMEDIATELY FOLLOWING SURGERY:  Do not drive or operate machinery for the first twenty four hours after surgery.  Do not make any important decisions for twenty four hours after surgery or while taking narcotic pain medications or sedatives.  If you develop intractable nausea and vomiting or a severe headache please notify your doctor immediately.  FOLLOW-UP:  Please make an appointment with your surgeon as instructed. You do not need to follow  up with anesthesia unless specifically instructed to do so.  WOUND CARE INSTRUCTIONS (if applicable):  Keep a dry clean dressing on the anesthesia/puncture wound site if there is drainage.  Once the wound has quit draining you may leave it open to air.  Generally you should leave the bandage intact for twenty four hours unless there is drainage.  If the epidural site drains for more than 36-48 hours please call the anesthesia department.  QUESTIONS?:  Please feel free to call your physician or the hospital operator if you have any questions, and they will be happy to assist you.      How to Use Chlorhexidine Before Surgery Chlorhexidine is a germ-killing(antiseptic) solution that is used to clean the skin. It gets rid of the bacteria that usually live on the skin. Because your skin needs to be very clean before you have surgery, your caregiver may give you chlorhexidine to wash with before the surgery. You may be given chlorhexidine to use at home. For example, you may be told to wash with chlorhexidine the night before and the morning of your surgery. Washing with chlorhexidine before surgery helps prevent infections from developing after the  surgery.  HOW TO USE CHLORHEXIDINE  Only use chlorhexidine as directed by your caregiver. Do not use more chlorhexidine than you are told to, and do not use it more often than you are told. Make sure you follow the instructions on the label. If you have any questions, call your caregiver. You can use chlorhexidine while taking a shower or a bath. Unless instructed otherwise, follow these steps when using chlorhexidine: 1. Wash your face and your hair using the soap and shampoo that you normally use. 2. Turn off the shower or stand up in the tub. Then, wash with chlorhexidine. To wash with chlorhexidine:  If you were given a special sponge, use that. If you were not given a sponge, use only a cloth. Do not use any sort of brush. If the sponge has a brush on one  side, do not use that side.  Start at your neck and scrub down to your toes. Avoid your genital area. Chlorhexidine can cause a skin reaction. Also, avoid any areas of skin that have cuts or scrapes.  Pay special attention to the part of your body where you will be having surgery. Scrub this area for at least 1 minute.  Be sure to get your back and under your arms.  Do not use chlorhexidine on your head. If the solution gets into your ears or eyes, rinse them well with water. Chlorhexidine can cause hearing lossif the solution gets into your ear. It can also harm your eye if it gets in your eye and is not rinsed out. 3. Rinse your body completely in the shower or tub. Be sure that all body creases and crevices are rinsed well. 4. Dry off with a clean towel. Do not put any substances on your body afterward, such as powder, lotion, or perfume. 5. Put on clean clothing. 6. If it is the night before your surgery, sleep in clean sheets. SEEK MEDICAL CARE IF:   Your skin gets irritated after scrubbing. SEEK IMMEDIATE MEDICAL CARE IF:   Your eyes become very red or swollen.  Your eyes itch badly.  Your skin itches badly and is red or swollen.  Your hearing changes.  You have trouble seeing.  You have trouble breathing.  You swallow any chlorhexidine. Document Released: 06/11/2012 Document Revised: 09/03/2012 Document Reviewed: 06/11/2012 [facility] Patient Information 2014 [place]. Inguinal Hernia, Adult Muscles help keep everything in the body in its proper place. But if a weak spot in the muscles develops, something can poke through. That is called a hernia. When this happens in the lower part of the belly (abdomen), it is called an inguinal hernia. (It takes its name from a part of the body in this region called the inguinal canal.) A weak spot in the wall of muscles lets some fat or part of the small intestine bulge through. An inguinal hernia can develop at any age. Men get  them more often than women. CAUSES  In adults, an inguinal hernia develops over time.  It can be triggered by:  Suddenly straining the muscles of the lower abdomen.  Lifting heavy objects.  Straining to have a bowel movement. Difficult bowel movements (constipation) can lead to this.  Constant coughing. This may be caused by smoking or lung disease.  Being overweight.  Being pregnant.  Working at a job that requires long periods of standing or heavy lifting.  Having had an inguinal hernia before. One type can be an emergency situation. It is called a strangulated  inguinal hernia. It develops if part of the small intestine slips through the weak spot and cannot get back into the abdomen. The blood supply can be cut off. If that happens, part of the intestine may die. This situation requires emergency surgery. SYMPTOMS  Often, a small inguinal hernia has no symptoms. It is found when a healthcare provider does a physical exam. Larger hernias usually have symptoms.   In adults, symptoms may include:  A lump in the groin. This is easier to see when the person is standing. It might disappear when lying down.  In men, a lump in the scrotum.  Pain or burning in the groin. This occurs especially when lifting, straining or coughing.  A dull ache or feeling of pressure in the groin.  Signs of a strangulated hernia can include:  A bulge in the groin that becomes very painful and tender to the touch.  A bulge that turns red or purple.  Fever, nausea and vomiting.  Inability to have a bowel movement or to pass gas. DIAGNOSIS  To decide if you have an inguinal hernia, a healthcare provider will probably do a physical examination.  This will include asking questions about any symptoms you have noticed.  The healthcare provider might feel the groin area and ask you to cough. If an inguinal hernia is felt, the healthcare provider may try to slide it back into the abdomen.  Usually  no other tests are needed. TREATMENT  Treatments can vary. The size of the hernia makes a difference. Options include:  Watchful waiting. This is often suggested if the hernia is small and you have had no symptoms.  No medical procedure will be done unless symptoms develop.  You will need to watch closely for symptoms. If any occur, contact your healthcare provider right away.  Surgery. This is used if the hernia is larger or you have symptoms.  Open surgery. This is usually an outpatient procedure (you will not stay overnight in a hospital). An cut (incision) is made through the skin in the groin. The hernia is put back inside the abdomen. The weak area in the muscles is then repaired by herniorrhaphy or hernioplasty. Herniorrhaphy: in this type of surgery, the weak muscles are sewn back together. Hernioplasty: a patch or mesh is used to close the weak area in the abdominal wall.  Laparoscopy. In this procedure, a surgeon makes small incisions. A thin tube with a tiny video camera (called a laparoscope) is put into the abdomen. The surgeon repairs the hernia with mesh by looking with the video camera and using two long instruments. HOME CARE INSTRUCTIONS   After surgery to repair an inguinal hernia:  You will need to take pain medicine prescribed by your healthcare provider. Follow all directions carefully.  You will need to take care of the wound from the incision.  Your activity will be restricted for awhile. This will probably include no heavy lifting for several weeks. You also should not do anything too active for a few weeks. When you can return to work will depend on the type of job that you have.  During "watchful waiting" periods, you should:  Maintain a healthy weight.  Eat a diet high in fiber (fruits, vegetables and whole grains).  Drink plenty of fluids to avoid constipation. This means drinking enough water and other liquids to keep your urine clear or pale yellow.  Do  not lift heavy objects.  Do not stand for long periods of time.  Quit smoking. This should keep you from developing a frequent cough. SEEK MEDICAL CARE IF:   A bulge develops in your groin area.  You feel pain, a burning sensation or pressure in the groin. This might be worse if you are lifting or straining.  You develop a fever of more than 100.5 F (38.1 C). SEEK IMMEDIATE MEDICAL CARE IF:   Pain in the groin increases suddenly.  A bulge in the groin gets bigger suddenly and does not go down.  For men, there is sudden pain in the scrotum. Or, the size of the scrotum increases.  A bulge in the groin area becomes red or purple and is painful to touch.  You have nausea or vomiting that does not go away.  You feel your heart beating much faster than normal.  You cannot have a bowel movement or pass gas.  You develop a fever of more than 102.0 F (38.9 C). Document Released: 02/03/2009 Document Revised: 12/10/2011 Document Reviewed: 02/03/2009 [facility] Patient Information 2014 [place], [place].  Excision of Skin Lesions Excision of a skin lesion refers to the removal of a section of skin by making small cuts (incisions) in the skin. This is typically done to remove a cancerous growth (basal cell carcinoma, squamous cell carcinoma, or melanoma) or a noncancerous growth (cyst). It may be done to treat or prevent cancer or infection. It may also be done to improve cosmetic appearance (removal of mole, skin tag). LET YOUR CAREGIVER KNOW ABOUT:   Allergies to food or medicine.  Medicines taken, including vitamins, herbs, eyedrops, over-the-counter medicines, and creams.  Use of steroids (by mouth or creams).  Previous problems with anesthetics or numbing medicines.  History of bleeding problems or blood clots.  History of any prostheses.  Previous surgery.  Other health problems, including diabetes and kidney problems.  Possibility of pregnancy, if this applies. RISKS  AND COMPLICATIONS  Many complications can be managed. With appropriate treatment and rehabilitation, the following complications are very uncommon:  Bleeding.  Infection.  Scarring.  Recurrence of cyst or cancer.  Changes in skin sensation or appearance (discoloration, swelling).  Reaction to anesthesia.  Allergic reaction to surgical materials or ointments.  Damage to nerves, blood vessels, muscles, or other structures.  Continued pain. BEFORE THE PROCEDURE  It is important to follow your caregiver's instructions prior to your procedure to avoid complications. Steps before your procedure may include:  Physical exam, blood tests, other procedures, such as removing a small sample for examination under a microscope (biopsy).  Your caregiver may review the procedure, the anesthesia being used, and what to expect after the procedure with you. You may be asked to:  Stop taking certain medicines, such as blood thinners (including aspirin, clopidogrel, ibuprofen), for several days prior to your procedure.  Take certain medicines.  Stop smoking. It is a good idea to arrange for a ride home after surgery and to have someone to help you with activities during recovery. PROCEDURE  There are several excision techniques. The type of excision or surgical technique used will depend on your condition, the location of the lesion, and your overall health. After the lesion is sterilized and a local anesthetic is applied, the following may be performed: Complete surgical excision The area to be removed is marked with a pen. Using a small scalpel and scissors, the surgeon gently cuts around and under the lesion until it is completely removed. The lesion is placed in a special fluid and sent to the lab for  examination. If necessary, bleeding will be controlled with a device that delivers heat. The edges of the wound are stitched together and a dressing is applied. This procedure may be performed to  treat a cancerous growth or noncancerous cyst or lesion. Surgeons commonly perform an elliptical excision, to minimize scarring. Excision of a cyst The surgeon makes an incision on the cyst. The entire cyst is removed through the incision. The wound may be closed with a suture (stitch). Shave excision During shave excision, the surgeon uses a small blade or loop instrument to shave off the lesion. This may be done to remove a mole or skin tag. The wound is usually left to heal on its own without stitches. Punch excision During punch excision, the surgeon uses a small, round tool (like a cookie cutter) to cut a circle shape out of the skin. The outer edges of the skin are stitched together. This may be done to remove a mole or scar or to perform a biopsy of the lesion. Mohs micrographic surgery During Mohs micrographic surgery, layers of the lesion are removed with a scalpel or loop instrument and immediately examined under a microscope until all of the abnormal or cancerous tissue is removed. This procedure is minimally invasive and ensures the best cosmetic outcome, with removal of as little normal tissue as possible. Mohs is usually done to treat skin cancer, such as basal cell carcinoma or squamous cell carcinoma, particularly on the face and ears. Antibiotic ointment is applied to the surgical area after each of the procedures listed above, as necessary. AFTER THE PROCEDURE  How well you heal depends on many factors. Most patients heal quite well with proper techniques and self-care. Scarring will lessen over time. HOME CARE INSTRUCTIONS   Take medicines for pain as directed.  Keep the incision area clean, dry, and protected for at least 48 hours. Change dressings as directed.  For bleeding, apply gentle but firm pressure to the wound using a folded towel for 20 minutes. Call your caregiver if bleeding does not stop.  Avoid high-impact exercise and activities until the stitches are removed or  the area heals.  Follow your caregiver's instructions to minimize scarring. Avoid sun exposure until the area has healed. Scarring should lessen over time.  Follow up with your caregiver as directed. Removal of stitches within 4 to 14 days may be necessary. Finding out the results of your test Not all test results are available during your visit. If your test results are not back during the visit, make an appointment with your caregiver to find out the results. Do not assume everything is normal if you have not heard from your caregiver or the medical facility. It is important for you to follow up on all of your test results. SEEK MEDICAL CARE IF:   You or your child has an oral temperature above 102 F (38.9 C).  You develop signs of infection (chills, feeling unwell).  You notice bleeding, pain, discharge, redness, or swelling at the incision site.  You notice skin irregularities or changes in sensation. MAKE SURE YOU:   Understand these instructions.  Will watch your condition.  Will get help right away if you are not doing well or get worse. FOR MORE INFORMATION  American Academy [employer]: www.AromatherapyParty.no American Academy of Dermatology: http://jones-macias.info/ Document Released: 12/12/2009 Document Revised: 12/10/2011 Document Reviewed: 12/12/2009 [facility] Patient Information 2014 [place].

## 2013-10-29 ENCOUNTER — Encounter (HOSPITAL_COMMUNITY)
Admission: RE | Admit: 2013-10-29 | Discharge: 2013-10-29 | Disposition: A | Discharge status: Home / Self Care | Payer: Federal, State, Local not specified - PPO | Source: Ambulatory Visit | Attending: General Surgery | Admitting: General Surgery

## 2013-10-29 ENCOUNTER — Other Ambulatory Visit: Payer: Self-pay

## 2013-10-29 ENCOUNTER — Encounter (HOSPITAL_COMMUNITY): Payer: Self-pay

## 2013-10-29 DIAGNOSIS — Z01812 Encounter for preprocedural laboratory examination: Secondary | ICD-10-CM | POA: Insufficient documentation

## 2013-10-29 DIAGNOSIS — Z01818 Encounter for other preprocedural examination: Secondary | ICD-10-CM | POA: Insufficient documentation

## 2013-10-29 LAB — BASIC METABOLIC PANEL
BUN: 22 mg/dL (ref 6–23)
CHLORIDE: 101 meq/L (ref 96–112)
CO2: 28 meq/L (ref 19–32)
Calcium: 9.9 mg/dL (ref 8.4–10.5)
Creatinine, Ser: 0.91 mg/dL (ref 0.50–1.35)
GFR calc non Af Amer: 88 mL/min — ABNORMAL LOW (ref >90)
Glucose, Bld: 112 mg/dL — ABNORMAL HIGH (ref 70–99)
POTASSIUM: 5.1 meq/L (ref 3.7–5.3)
SODIUM: 141 meq/L (ref 137–147)

## 2013-10-29 LAB — CBC WITH DIFFERENTIAL/PLATELET
Basophils Absolute: 0 10*3/uL (ref 0.0–0.1)
Basophils Relative: 0 % (ref 0–1)
EOS PCT: 4 % (ref 0–5)
Eosinophils Absolute: 0.4 10*3/uL (ref 0.0–0.7)
HEMATOCRIT: 48.6 % (ref 39.0–52.0)
HEMOGLOBIN: 16.9 g/dL (ref 13.0–17.0)
LYMPHS ABS: 2.1 10*3/uL (ref 0.7–4.0)
LYMPHS PCT: 23 % (ref 12–46)
MCH: 32.3 pg (ref 26.0–34.0)
MCHC: 34.8 g/dL (ref 30.0–36.0)
MCV: 92.9 fL (ref 78.0–100.0)
MONOS PCT: 12 % (ref 3–12)
Monocytes Absolute: 1.1 10*3/uL — ABNORMAL HIGH (ref 0.1–1.0)
NEUTROS ABS: 5.4 10*3/uL (ref 1.7–7.7)
Neutrophils Relative %: 61 % (ref 43–77)
Platelets: 294 10*3/uL (ref 150–400)
RBC: 5.23 MIL/uL (ref 4.22–5.81)
RDW: 12.9 % (ref 11.5–15.5)
WBC: 9 10*3/uL (ref 4.0–10.5)

## 2013-10-29 MED ORDER — CHLORHEXIDINE GLUCONATE 4 % EX LIQD: 1.0000 "application " | Freq: Once | CUTANEOUS | Status: DC

## 2013-10-29 NOTE — H&P (Signed)
  NTS SOAP Note  Vital Signs:  Vitals as of: 24/26/8341: Systolic 962: Diastolic 97: Heart Rate 69: Temp 98.44F: Height 64ft 1in: Weight 166Lbs 0 Ounces: Pain Level 3: BMI 21.9  BMI : 21.9 kg/m2  Subjective: This 63 Years 64 Months old Male presents for of right groin pain.  Patient states he has had right groin pain intermittently over the past six weeks.  Made worse with straining.  Pain is localized to right groin, minimal radiation to right inner thigh.  s/p RIH over ten years ago.  Review of Symptoms:  Constitutional:unremarkable   Head:unremarkable    Eyes:unremarkable   Nose/Mouth/Throat:unremarkable Cardiovascular:  unremarkable   Respiratory:unremarkable   Gastrointestinal:  unremarkable   Genitourinary:unremarkable     Musculoskeletal:unremarkable   Skin:unremarkable Hematolgic/Lymphatic:unremarkable     Allergic/Immunologic:unremarkable     Past Medical History:    Reviewed   Past Medical History  Surgical History: lap chole, right inguinal herniorrhapy, tcs within last ten years Medical Problems: neurofibromatosis Allergies: nkda Medications: albuterol, xanax, baby asa   Social History:Reviewed  Social History  Preferred Language: English Ethnicity: Not Hispanic / Latino Age: 63 Years 8 Months Marital Status:  M Alcohol: socially Recreational drug(s):  No   Smoking Status: Never smoker reviewed on 09/30/2012 Functional Status reviewed on mm/dd/yyyy ------------------------------------------------ Bathing: Normal Cooking: Normal Dressing: Normal Driving: Normal Eating: Normal Managing Meds: Normal Oral Care: Normal Shopping: Normal Toileting: Normal Transferring: Normal Walking: Normal Cognitive Status reviewed on mm/dd/yyyy ------------------------------------------------ Attention: Normal Decision Making: Normal Language: Normal Memory: Normal Motor: Normal Perception: Normal Problem Solving:  Normal Visual and Spatial: Normal   Family History:  Reviewed  Family Health History Family History is Unknown    Objective Information: General:  Well appearing, well nourished in no distress. Heart:  RRR, no murmur Lungs:    CTA bilaterally, no wheezes, rhonchi, rales.  Breathing unlabored. Abdomen:Soft, NT/ND, no HSM, no masses.  Small reducible right inguinal hernia noted.  No left inguinal hernia. IW:[id number]    Assessment:Recurrent right inguinal hernia    Plan:  Will do surgery if patient desires.  No need for acute surgical intervention at this time, which is fine with me.  Will call me to schedule surgery if symptoms worsen.   Patient Education:Alternative treatments to surgery were discussed with patient (and family).  Risks and benefits  of procedure were fully explained to the patient (and family) who gave informed consent. Patient/family questions were addressed.  Follow-up:PRN

## 2013-11-04 ENCOUNTER — Encounter (HOSPITAL_COMMUNITY): Payer: Federal, State, Local not specified - PPO | Admitting: Anesthesiology

## 2013-11-04 ENCOUNTER — Encounter (HOSPITAL_COMMUNITY)
Admission: RE | Disposition: A | Discharge status: Home / Self Care | Payer: Self-pay | Source: Ambulatory Visit | Attending: General Surgery

## 2013-11-04 ENCOUNTER — Encounter (HOSPITAL_COMMUNITY): Payer: Self-pay | Admitting: *Deleted

## 2013-11-04 ENCOUNTER — Ambulatory Visit (HOSPITAL_COMMUNITY)
Admission: RE | Admit: 2013-11-04 | Discharge: 2013-11-04 | Disposition: A | Discharge status: Home / Self Care | Payer: Federal, State, Local not specified - PPO | Source: Ambulatory Visit | Attending: General Surgery | Admitting: General Surgery

## 2013-11-04 ENCOUNTER — Ambulatory Visit (HOSPITAL_COMMUNITY): Payer: Federal, State, Local not specified - PPO | Admitting: Anesthesiology

## 2013-11-04 DIAGNOSIS — D214 Benign neoplasm of connective and other soft tissue of abdomen: Secondary | ICD-10-CM | POA: Insufficient documentation

## 2013-11-04 DIAGNOSIS — K4090 Unilateral inguinal hernia, without obstruction or gangrene, not specified as recurrent: Secondary | ICD-10-CM | POA: Insufficient documentation

## 2013-11-04 DIAGNOSIS — Z7982 Long term (current) use of aspirin: Secondary | ICD-10-CM | POA: Insufficient documentation

## 2013-11-04 HISTORY — PX: INGUINAL HERNIA REPAIR: SHX194

## 2013-11-04 HISTORY — PX: LESION REMOVAL: SHX5196

## 2013-11-04 SURGERY — REPAIR, HERNIA, INGUINAL, ADULT
Anesthesia: General | Site: Abdomen | Laterality: Right

## 2013-11-04 MED ORDER — MIDAZOLAM HCL 2 MG/2ML IJ SOLN
INTRAMUSCULAR | Status: AC
  Filled 2013-11-04: qty 2

## 2013-11-04 MED ORDER — LIDOCAINE HCL 1 % IJ SOLN
INTRAMUSCULAR | Status: DC | PRN
  Administered 2013-11-04: 50 mg via INTRADERMAL

## 2013-11-04 MED ORDER — MIDAZOLAM HCL 2 MG/2ML IJ SOLN
1.0000 mg | INTRAMUSCULAR | Status: DC | PRN
  Administered 2013-11-04 (×2): 2 mg via INTRAVENOUS
  Filled 2013-11-04 (×2): qty 2

## 2013-11-04 MED ORDER — PROPOFOL 10 MG/ML IV EMUL
INTRAVENOUS | Status: AC
  Filled 2013-11-04: qty 20

## 2013-11-04 MED ORDER — FENTANYL CITRATE 0.05 MG/ML IJ SOLN
25.0000 ug | INTRAMUSCULAR | Status: DC | PRN
  Administered 2013-11-04 (×2): 50 ug via INTRAVENOUS

## 2013-11-04 MED ORDER — FENTANYL CITRATE 0.05 MG/ML IJ SOLN
INTRAMUSCULAR | Status: DC | PRN
  Administered 2013-11-04 (×2): 25 ug via INTRAVENOUS
  Administered 2013-11-04: 50 ug via INTRAVENOUS

## 2013-11-04 MED ORDER — FENTANYL CITRATE 0.05 MG/ML IJ SOLN
INTRAMUSCULAR | Status: AC
  Filled 2013-11-04: qty 2

## 2013-11-04 MED ORDER — LIDOCAINE HCL (PF) 1 % IJ SOLN
INTRAMUSCULAR | Status: AC
  Filled 2013-11-04: qty 5

## 2013-11-04 MED ORDER — OXYCODONE-ACETAMINOPHEN 7.5-325 MG PO TABS: 1.0000 | ORAL_TABLET | ORAL | Status: DC | PRN

## 2013-11-04 MED ORDER — MIDAZOLAM HCL 5 MG/5ML IJ SOLN
INTRAMUSCULAR | Status: DC | PRN
  Administered 2013-11-04: 2 mg via INTRAVENOUS

## 2013-11-04 MED ORDER — FENTANYL CITRATE 0.05 MG/ML IJ SOLN
25.0000 ug | INTRAMUSCULAR | Status: AC
  Administered 2013-11-04 (×2): 25 ug via INTRAVENOUS
  Filled 2013-11-04: qty 2

## 2013-11-04 MED ORDER — ONDANSETRON HCL 4 MG/2ML IJ SOLN
4.0000 mg | Freq: Once | INTRAMUSCULAR | Status: AC
  Administered 2013-11-04: 4 mg via INTRAVENOUS
  Filled 2013-11-04: qty 2

## 2013-11-04 MED ORDER — PROPOFOL 10 MG/ML IV BOLUS
INTRAVENOUS | Status: DC | PRN
  Administered 2013-11-04: 150 mg via INTRAVENOUS

## 2013-11-04 MED ORDER — BUPIVACAINE HCL (PF) 0.5 % IJ SOLN
INTRAMUSCULAR | Status: DC | PRN
  Administered 2013-11-04: 7 mL
  Administered 2013-11-04: 10 mL

## 2013-11-04 MED ORDER — KETOROLAC TROMETHAMINE 30 MG/ML IJ SOLN
30.0000 mg | Freq: Once | INTRAMUSCULAR | Status: AC
  Administered 2013-11-04: 30 mg via INTRAVENOUS
  Filled 2013-11-04: qty 1

## 2013-11-04 MED ORDER — LACTATED RINGERS IV SOLN
INTRAVENOUS | Status: DC
  Administered 2013-11-04 (×2): via INTRAVENOUS

## 2013-11-04 MED ORDER — ONDANSETRON HCL 4 MG/2ML IJ SOLN
4.0000 mg | Freq: Once | INTRAMUSCULAR | Status: DC | PRN
Start: 2013-11-04 — End: 2013-11-04

## 2013-11-04 MED ORDER — BUPIVACAINE HCL (PF) 0.5 % IJ SOLN
INTRAMUSCULAR | Status: AC
  Filled 2013-11-04: qty 30

## 2013-11-04 MED ORDER — SODIUM CHLORIDE 0.9 % IR SOLN
Status: DC | PRN
  Administered 2013-11-04: 1000 mL

## 2013-11-04 MED ORDER — CEFAZOLIN SODIUM-DEXTROSE 2-3 GM-% IV SOLR
2.0000 g | INTRAVENOUS | Status: AC
  Administered 2013-11-04: 2 g via INTRAVENOUS
  Filled 2013-11-04: qty 50

## 2013-11-04 SURGICAL SUPPLY — 51 items
ADH SKN CLS APL DERMABOND .7 (GAUZE/BANDAGES/DRESSINGS) ×2
BAG HAMPER (MISCELLANEOUS) ×4 IMPLANT
BLADE SURG 15 STRL LF DISP TIS (BLADE) IMPLANT
BLADE SURG 15 STRL SS (BLADE) ×4
CLOSURE WOUND 1/2 X4 (GAUZE/BANDAGES/DRESSINGS) ×1
CLOTH BEACON ORANGE TIMEOUT ST (SAFETY) ×4 IMPLANT
COVER LIGHT HANDLE STERIS (MISCELLANEOUS) ×8 IMPLANT
DECANTER SPIKE VIAL GLASS SM (MISCELLANEOUS) ×4 IMPLANT
DERMABOND ADVANCED (GAUZE/BANDAGES/DRESSINGS) ×2
DERMABOND ADVANCED .7 DNX12 (GAUZE/BANDAGES/DRESSINGS) ×2 IMPLANT
DRAIN PENROSE 3/4X12 (DRAIN) ×4 IMPLANT
DURAPREP 26ML APPLICATOR (WOUND CARE) ×4 IMPLANT
ELECT REM PT RETURN 9FT ADLT (ELECTROSURGICAL) ×4
ELECTRODE REM PT RTRN 9FT ADLT (ELECTROSURGICAL) ×2 IMPLANT
FORMALIN 10 PREFIL 120ML (MISCELLANEOUS) ×4 IMPLANT
GLOVE BIO SURGEON STRL SZ7.5 (GLOVE) ×4 IMPLANT
GLOVE BIOGEL PI IND STRL 7.0 (GLOVE) IMPLANT
GLOVE BIOGEL PI IND STRL 8 (GLOVE) ×2 IMPLANT
GLOVE BIOGEL PI INDICATOR 7.0 (GLOVE) ×4
GLOVE BIOGEL PI INDICATOR 8 (GLOVE) ×2
GLOVE SS BIOGEL STRL SZ 6.5 (GLOVE) IMPLANT
GLOVE SUPERSENSE BIOGEL SZ 6.5 (GLOVE) ×4
GOWN STRL REUS W/TWL LRG LVL3 (GOWN DISPOSABLE) ×12 IMPLANT
INST SET MINOR GENERAL (KITS) ×4 IMPLANT
KIT ROOM TURNOVER APOR (KITS) ×4 IMPLANT
MANIFOLD NEPTUNE II (INSTRUMENTS) ×4 IMPLANT
NDL HYPO 18GX1.5 BLUNT FILL (NEEDLE) IMPLANT
NDL HYPO 25X1 1.5 SAFETY (NEEDLE) ×2 IMPLANT
NEEDLE HYPO 18GX1.5 BLUNT FILL (NEEDLE) IMPLANT
NEEDLE HYPO 25X1 1.5 SAFETY (NEEDLE) ×4 IMPLANT
NS IRRIG 1000ML POUR BTL (IV SOLUTION) ×4 IMPLANT
PACK MINOR (CUSTOM PROCEDURE TRAY) ×4 IMPLANT
PACK PERI GYN (CUSTOM PROCEDURE TRAY) IMPLANT
PAD ARMBOARD 7.5X6 YLW CONV (MISCELLANEOUS) ×4 IMPLANT
SET BASIN LINEN APH (SET/KITS/TRAYS/PACK) ×4 IMPLANT
SOL PREP PROV IODINE SCRUB 4OZ (MISCELLANEOUS) ×4 IMPLANT
STRIP CLOSURE SKIN 1/2X4 (GAUZE/BANDAGES/DRESSINGS) ×3 IMPLANT
SUT NOVA NAB GS-22 2 2-0 T-19 (SUTURE) ×2 IMPLANT
SUT NOVAFIL NAB HGS22 2-0 30IN (SUTURE) IMPLANT
SUT PROLENE 3 0 PS 1 (SUTURE) IMPLANT
SUT SILK 3 0 (SUTURE)
SUT SILK 3-0 18XBRD TIE 12 (SUTURE) IMPLANT
SUT VIC AB 2-0 CT1 27 (SUTURE) ×4
SUT VIC AB 2-0 CT1 TAPERPNT 27 (SUTURE) ×2 IMPLANT
SUT VIC AB 3-0 SH 27 (SUTURE) ×4
SUT VIC AB 3-0 SH 27X BRD (SUTURE) ×2 IMPLANT
SUT VIC AB 4-0 PS2 27 (SUTURE) ×4 IMPLANT
SUT VICRYL AB 3 0 TIES (SUTURE) IMPLANT
SYR BULB IRRIGATION 50ML (SYRINGE) ×4 IMPLANT
SYR CONTROL 10ML LL (SYRINGE) ×4 IMPLANT
TOWEL OR 17X26 4PK STRL BLUE (TOWEL DISPOSABLE) ×4 IMPLANT

## 2013-11-04 NOTE — Interval H&P Note (Signed)
History and Physical Interval Note:  11/04/2013 10:26 AM  Chad Butler  has presented today for surgery, with the diagnosis of recurrent right inguinal hernia, skin lesion on trunk  The various methods of treatment have been discussed with the patient and family. After consideration of risks, benefits and other options for treatment, the patient has consented to  Procedure(s): HERNIA REPAIR INGUINAL ADULT (Right) EXCISION SKIN LESION TRUNK  (N/A) as a surgical intervention .  The patient's history has been reviewed, patient examined, no change in status, stable for surgery.  I have reviewed the patient's chart and labs.  Questions were answered to the patient's satisfaction.     Aviva Signs A

## 2013-11-04 NOTE — Anesthesia Postprocedure Evaluation (Signed)
  Anesthesia Post-op Note  Patient: Chad Butler [facility]  Procedure(s) Performed: Procedure(s): RIGHT INGUINAL HERNIA REPAIR (Right) EXCISION OF SKIN LESION ABDOMINAL WALL (N/A)  Patient Location: PACU  Anesthesia Type:General  Level of Consciousness: awake, alert  and oriented  Airway and Oxygen Therapy: Patient Spontanous Breathing and Patient connected to face mask oxygen  Post-op Pain: none  Post-op Assessment: Post-op Vital signs reviewed, Patient's Cardiovascular Status Stable, Respiratory Function Stable, Patent Airway and No signs of Nausea or vomiting  Post-op Vital Signs: Reviewed and stable  Complications: No apparent anesthesia complications

## 2013-11-04 NOTE — Transfer of Care (Signed)
Immediate Anesthesia Transfer of Care Note  Patient: Chad Butler [facility]  Procedure(s) Performed: Procedure(s): RIGHT INGUINAL HERNIA REPAIR (Right) EXCISION OF SKIN LESION ABDOMINAL WALL (N/A)  Patient Location: PACU  Anesthesia Type:General  Level of Consciousness: sedated  Airway & Oxygen Therapy: Patient Spontanous Breathing and Patient connected to face mask oxygen  Post-op Assessment: Report given to PACU RN  Post vital signs: Reviewed  Complications: No apparent anesthesia complications

## 2013-11-04 NOTE — Op Note (Signed)
Patient:  Chad Butler  DOB:  05-02-1951  MRN:  [id number]   Preop Diagnosis:  Recurrent right inguinal hernia, neurofibromatosis  Postop Diagnosis:  Same  Procedure:  Recurrent right anal herniorrhaphy, excision of large neural fibroma, abdominal wall  Surgeon:  Aviva Signs, M.D.  Anes:  General  Indications:  Patient is a 63 year old white male who presents with increasing swelling in the right groin region as well as a large neurofibroma just superior to the right groin crease. He is status post a right inguinal herniorrhaphy with mesh over 10 years ago. The risks and benefits of the procedure including bleeding, infection, and recurrence of the hernia were fully explained to the patient, who gave informed consent.  Procedure note:  The patient is placed the supine position. After general anesthesia was administered, the right groin region was prepped and draped using usual sterile technique with Betadine. Surgical site confirmation was performed.  An incision was made to the right groin surgical scar. The dissection was taken down to the external oblique aponeuroses. The aponeuroses was incised without difficulty. A Penrose drain was placed around the spermatic cord. The vase deferens was noted to the spermatic cord. On inspection of the floor of inguinal canal, no specific direct or indirect hernia could be identified. There was significant laxity of the onlay mesh that had been placed along the floor of the inguinal canal. This was tightened to the shelving edge of Poupart's ligament using 2-0 Novafil interrupted sutures. The external oblique aponeuroses was reapproximated using a 2-0 Vicryl running suture. The subcutaneous layer was reapproximated using 3-0 Vicryl interrupted suture. The skin was closed using a 4-0 Vicryl subcuticular suture. 0.5% Sensorcaine was instilled the surrounding wound. Dermabond was applied at the end of the procedure.  Next, a large neurofibroma was noted  just superior to the right groin crease. This was excised at its base using an elliptical incision without difficulty. Sent to pathology further examination. A bleeding was controlled using Bovie cautery. The skin was reapproximated using a 4-0 Vicryl subcuticular suture. 0.5% Sensorcaine was instilled in the surrounding wound. Dermabond was then applied.  All tape and needle counts were correct at the end of the procedure. Patient was awakened and transferred to PACU in stable condition.  Complications:  None  EBL:  Minimal  Specimen:  Neurofibroma, abdominal wall

## 2013-11-04 NOTE — Anesthesia Procedure Notes (Signed)
Procedure Name: LMA Insertion Date/Time: 11/04/2013 11:24 AM Performed by: Charmaine Downs Pre-anesthesia Checklist: Patient being monitored, Suction available, Emergency Drugs available and Patient identified Patient Re-evaluated:Patient Re-evaluated prior to inductionOxygen Delivery Method: Circle system utilized Preoxygenation: Pre-oxygenation with 100% oxygen Intubation Type: IV induction Ventilation: Mask ventilation without difficulty LMA: LMA inserted LMA Size: 4.0 Tube type: Oral Number of attempts: 1 Placement Confirmation: positive ETCO2 and breath sounds checked- equal and bilateral Tube secured with: Tape Dental Injury: Teeth and Oropharynx as per pre-operative assessment

## 2013-11-04 NOTE — Anesthesia Preprocedure Evaluation (Signed)
Anesthesia Evaluation  Patient identified by MRN, date of birth, ID band Patient awake    Reviewed: Allergy & Precautions, H&P , NPO status , Patient's Chart, lab work & pertinent test results  Airway Mallampati: I TM Distance: >3 FB Neck ROM: Full    Dental  (+) Teeth Intact   Pulmonary neg pulmonary ROS,  breath sounds clear to auscultation        Cardiovascular negative cardio ROS  Rhythm:Regular Rate:Normal     Neuro/Psych PSYCHIATRIC DISORDERS Anxiety    GI/Hepatic negative GI ROS,   Endo/Other    Renal/GU      Musculoskeletal   Abdominal   Peds  Hematology   Anesthesia Other Findings   Reproductive/Obstetrics                           Anesthesia Physical Anesthesia Plan  ASA: I  Anesthesia Plan: General   Post-op Pain Management:    Induction: Intravenous  Airway Management Planned: LMA  Additional Equipment:   Intra-op Plan:   Post-operative Plan: Extubation in OR  Informed Consent: I have reviewed the patients History and Physical, chart, labs and discussed the procedure including the risks, benefits and alternatives for the proposed anesthesia with the patient or authorized representative who has indicated his/her understanding and acceptance.     Plan Discussed with:   Anesthesia Plan Comments:         Anesthesia Quick Evaluation

## 2013-11-04 NOTE — Discharge Instructions (Signed)
Inguinal Hernia, Adult  °Care After °Refer to this sheet in the next few weeks. These discharge instructions provide you with general information on caring for yourself after you leave the hospital. Your caregiver may also give you specific instructions. Your treatment has been planned according to the most current medical practices available, but unavoidable complications sometimes occur. If you have any problems or questions after discharge, please call your caregiver. °HOME CARE INSTRUCTIONS °· Put ice on the operative site. °· Put ice in a plastic bag. °· Place a towel between your skin and the bag. °· Leave the ice on for 15-20 minutes at a time, 03-04 times a day while awake. °· Change bandages (dressings) as directed. °· Keep the wound dry and clean. The wound may be washed gently with soap and water. Gently blot or dab the wound dry. It is okay to take showers 24 to 48 hours after surgery. Do not take baths, use swimming pools, or use hot tubs for 10 days, or as directed by your caregiver. °· Only take over-the-counter or prescription medicines for pain, discomfort, or fever as directed by your caregiver. °· Continue your normal diet as directed. °· Do not lift anything more than 10 pounds or play contact sports for 3 weeks, or as directed. °SEEK MEDICAL CARE IF: °· There is redness, swelling, or increasing pain in the wound. °· There is fluid (pus) coming from the wound. °· There is drainage from a wound lasting longer than 1 day. °· You have an oral temperature above 102° F (38.9° C). °· You notice a bad smell coming from the wound or dressing. °· The wound breaks open after the stitches (sutures) have been removed. °· You notice increasing pain in the shoulders (shoulder strap areas). °· You develop dizzy episodes or fainting while standing. °· You feel sick to your stomach (nauseous) or throw up (vomit). °SEEK IMMEDIATE MEDICAL CARE IF: °· You develop a rash. °· You have difficulty breathing. °· You  develop a reaction or have side effects to medicines you were given. °MAKE SURE YOU:  °· Understand these instructions. °· Will watch your condition. °· Will get help right away if you are not doing well or get worse. °Document Released: 10/18/2006 Document Revised: 12/10/2011 Document Reviewed: 08/17/2009 °ExitCare® Patient Information ©2014 [employer]. ° °

## 2013-11-09 ENCOUNTER — Encounter (HOSPITAL_COMMUNITY): Payer: Self-pay | Admitting: General Surgery

## 2013-12-03 ENCOUNTER — Other Ambulatory Visit (HOSPITAL_COMMUNITY): Payer: Self-pay | Admitting: Family Medicine

## 2013-12-03 ENCOUNTER — Ambulatory Visit (HOSPITAL_COMMUNITY)
Admission: RE | Admit: 2013-12-03 | Discharge: 2013-12-03 | Disposition: A | Discharge status: Home / Self Care | Payer: Federal, State, Local not specified - PPO | Source: Ambulatory Visit | Attending: Family Medicine | Admitting: Family Medicine

## 2013-12-03 DIAGNOSIS — M25559 Pain in unspecified hip: Secondary | ICD-10-CM | POA: Insufficient documentation

## 2014-02-01 ENCOUNTER — Ambulatory Visit (HOSPITAL_COMMUNITY)
Admission: RE | Admit: 2014-02-01 | Discharge: 2014-02-01 | Disposition: A | Discharge status: Home / Self Care | Payer: Federal, State, Local not specified - PPO | Source: Ambulatory Visit | Attending: Internal Medicine | Admitting: Internal Medicine

## 2014-02-01 ENCOUNTER — Encounter (INDEPENDENT_AMBULATORY_CARE_PROVIDER_SITE_OTHER): Payer: Self-pay

## 2014-02-01 ENCOUNTER — Other Ambulatory Visit (HOSPITAL_COMMUNITY): Payer: Self-pay | Admitting: Internal Medicine

## 2014-02-01 DIAGNOSIS — J984 Other disorders of lung: Secondary | ICD-10-CM | POA: Insufficient documentation

## 2014-02-01 DIAGNOSIS — J449 Chronic obstructive pulmonary disease, unspecified: Secondary | ICD-10-CM | POA: Insufficient documentation

## 2014-02-01 DIAGNOSIS — R079 Chest pain, unspecified: Secondary | ICD-10-CM | POA: Insufficient documentation

## 2014-02-01 DIAGNOSIS — F329 Major depressive disorder, single episode, unspecified: Secondary | ICD-10-CM | POA: Diagnosis not present

## 2014-02-01 DIAGNOSIS — J4489 Other specified chronic obstructive pulmonary disease: Secondary | ICD-10-CM | POA: Insufficient documentation

## 2014-02-01 DIAGNOSIS — IMO0002 Reserved for concepts with insufficient information to code with codable children: Secondary | ICD-10-CM | POA: Diagnosis not present

## 2014-02-01 DIAGNOSIS — F3289 Other specified depressive episodes: Secondary | ICD-10-CM | POA: Diagnosis not present

## 2014-02-01 DIAGNOSIS — R030 Elevated blood-pressure reading, without diagnosis of hypertension: Secondary | ICD-10-CM | POA: Diagnosis not present

## 2014-02-18 DIAGNOSIS — R079 Chest pain, unspecified: Secondary | ICD-10-CM | POA: Diagnosis not present

## 2014-02-18 DIAGNOSIS — R0789 Other chest pain: Secondary | ICD-10-CM | POA: Diagnosis not present

## 2014-03-04 DIAGNOSIS — R079 Chest pain, unspecified: Secondary | ICD-10-CM | POA: Diagnosis not present

## 2014-03-04 DIAGNOSIS — R0789 Other chest pain: Secondary | ICD-10-CM | POA: Diagnosis not present

## 2014-03-20 DIAGNOSIS — R05 Cough: Secondary | ICD-10-CM | POA: Diagnosis not present

## 2014-03-20 DIAGNOSIS — R059 Cough, unspecified: Secondary | ICD-10-CM | POA: Diagnosis not present

## 2014-03-20 DIAGNOSIS — IMO0002 Reserved for concepts with insufficient information to code with codable children: Secondary | ICD-10-CM | POA: Diagnosis not present

## 2014-06-24 DIAGNOSIS — IMO0002 Reserved for concepts with insufficient information to code with codable children: Secondary | ICD-10-CM | POA: Diagnosis not present

## 2014-07-21 DIAGNOSIS — Q8501 Neurofibromatosis, type 1: Secondary | ICD-10-CM | POA: Diagnosis not present

## 2014-07-21 DIAGNOSIS — R1110 Vomiting, unspecified: Secondary | ICD-10-CM | POA: Diagnosis not present

## 2014-07-21 DIAGNOSIS — Z6822 Body mass index (BMI) 22.0-22.9, adult: Secondary | ICD-10-CM | POA: Diagnosis not present

## 2014-08-03 ENCOUNTER — Encounter: Payer: Self-pay | Admitting: Gastroenterology

## 2014-09-09 ENCOUNTER — Ambulatory Visit: Payer: Federal, State, Local not specified - PPO | Admitting: Gastroenterology

## 2014-10-12 ENCOUNTER — Ambulatory Visit (HOSPITAL_COMMUNITY)
Admission: RE | Admit: 2014-10-12 | Discharge: 2014-10-12 | Disposition: A | Discharge status: Home / Self Care | Payer: Federal, State, Local not specified - PPO | Source: Ambulatory Visit | Attending: Family Medicine | Admitting: Family Medicine

## 2014-10-12 ENCOUNTER — Other Ambulatory Visit (HOSPITAL_COMMUNITY): Payer: Self-pay | Admitting: Family Medicine

## 2014-10-12 DIAGNOSIS — M79602 Pain in left arm: Secondary | ICD-10-CM

## 2014-10-12 DIAGNOSIS — Z6822 Body mass index (BMI) 22.0-22.9, adult: Secondary | ICD-10-CM | POA: Diagnosis not present

## 2014-10-12 DIAGNOSIS — M25512 Pain in left shoulder: Secondary | ICD-10-CM | POA: Diagnosis not present

## 2014-11-17 ENCOUNTER — Emergency Department (HOSPITAL_COMMUNITY): Payer: Federal, State, Local not specified - PPO

## 2014-11-17 ENCOUNTER — Encounter (HOSPITAL_COMMUNITY): Payer: Self-pay | Admitting: *Deleted

## 2014-11-17 ENCOUNTER — Emergency Department (HOSPITAL_COMMUNITY)
Admission: EM | Admit: 2014-11-17 | Discharge: 2014-11-17 | Disposition: A | Discharge status: Home / Self Care | Payer: Federal, State, Local not specified - PPO | Attending: Emergency Medicine | Admitting: Emergency Medicine

## 2014-11-17 DIAGNOSIS — R0781 Pleurodynia: Secondary | ICD-10-CM | POA: Diagnosis not present

## 2014-11-17 DIAGNOSIS — I10 Essential (primary) hypertension: Secondary | ICD-10-CM | POA: Diagnosis not present

## 2014-11-17 DIAGNOSIS — Z8719 Personal history of other diseases of the digestive system: Secondary | ICD-10-CM | POA: Insufficient documentation

## 2014-11-17 DIAGNOSIS — S299XXA Unspecified injury of thorax, initial encounter: Secondary | ICD-10-CM | POA: Diagnosis not present

## 2014-11-17 DIAGNOSIS — Z7982 Long term (current) use of aspirin: Secondary | ICD-10-CM | POA: Insufficient documentation

## 2014-11-17 DIAGNOSIS — Z79899 Other long term (current) drug therapy: Secondary | ICD-10-CM | POA: Diagnosis not present

## 2014-11-17 DIAGNOSIS — Y998 Other external cause status: Secondary | ICD-10-CM | POA: Diagnosis not present

## 2014-11-17 DIAGNOSIS — S6991XA Unspecified injury of right wrist, hand and finger(s), initial encounter: Secondary | ICD-10-CM | POA: Diagnosis present

## 2014-11-17 DIAGNOSIS — S20211A Contusion of right front wall of thorax, initial encounter: Secondary | ICD-10-CM | POA: Diagnosis not present

## 2014-11-17 DIAGNOSIS — Y9389 Activity, other specified: Secondary | ICD-10-CM | POA: Insufficient documentation

## 2014-11-17 DIAGNOSIS — W108XXA Fall (on) (from) other stairs and steps, initial encounter: Secondary | ICD-10-CM | POA: Diagnosis not present

## 2014-11-17 DIAGNOSIS — F419 Anxiety disorder, unspecified: Secondary | ICD-10-CM | POA: Insufficient documentation

## 2014-11-17 DIAGNOSIS — Z791 Long term (current) use of non-steroidal anti-inflammatories (NSAID): Secondary | ICD-10-CM | POA: Diagnosis not present

## 2014-11-17 DIAGNOSIS — Y9289 Other specified places as the place of occurrence of the external cause: Secondary | ICD-10-CM | POA: Insufficient documentation

## 2014-11-17 DIAGNOSIS — S60221A Contusion of right hand, initial encounter: Secondary | ICD-10-CM

## 2014-11-17 DIAGNOSIS — F319 Bipolar disorder, unspecified: Secondary | ICD-10-CM | POA: Insufficient documentation

## 2014-11-17 DIAGNOSIS — W19XXXA Unspecified fall, initial encounter: Secondary | ICD-10-CM

## 2014-11-17 HISTORY — DX: Essential (primary) hypertension: I10

## 2014-11-17 HISTORY — DX: Depression, unspecified: F32.A

## 2014-11-17 HISTORY — DX: Major depressive disorder, single episode, unspecified: F32.9

## 2014-11-17 NOTE — Discharge Instructions (Signed)
Contusion °A contusion is a deep bruise. Contusions are the result of an injury that caused bleeding under the skin. The contusion may turn blue, purple, or yellow. Minor injuries will give you a painless contusion, but more severe contusions may stay painful and swollen for a few weeks.  °CAUSES  °A contusion is usually caused by a blow, trauma, or direct force to an area of the body. °SYMPTOMS  °· Swelling and redness of the injured area. °· Bruising of the injured area. °· Tenderness and soreness of the injured area. °· Pain. °DIAGNOSIS  °The diagnosis can be made by taking a history and physical exam. An X-ray, CT scan, or MRI may be needed to determine if there were any associated injuries, such as fractures. °TREATMENT  °Specific treatment will depend on what area of the body was injured. In general, the best treatment for a contusion is resting, icing, elevating, and applying cold compresses to the injured area. Over-the-counter medicines may also be recommended for pain control. Ask your caregiver what the best treatment is for your contusion. °HOME CARE INSTRUCTIONS  °· Put ice on the injured area. °¨ Put ice in a plastic bag. °¨ Place a towel between your skin and the bag. °¨ Leave the ice on for 15-20 minutes, 3-4 times a day, or as directed by your health care provider. °· Only take over-the-counter or prescription medicines for pain, discomfort, or fever as directed by your caregiver. Your caregiver may recommend avoiding anti-inflammatory medicines (aspirin, ibuprofen, and naproxen) for 48 hours because these medicines may increase bruising. °· Rest the injured area. °· If possible, elevate the injured area to reduce swelling. °SEEK IMMEDIATE MEDICAL CARE IF:  °· You have increased bruising or swelling. °· You have pain that is getting worse. °· Your swelling or pain is not relieved with medicines. °MAKE SURE YOU:  °· Understand these instructions. °· Will watch your condition. °· Will get help right  away if you are not doing well or get worse. °Document Released: 06/27/2005 Document Revised: 09/22/2013 Document Reviewed: 07/23/2011 °ExitCare® Patient Information ©2015 [employer]. This information is not intended to replace advice given to you by your health care provider. Make sure you discuss any questions you have with your health care provider. ° °

## 2014-11-17 NOTE — ED Notes (Signed)
nad noted prior to dc. Dc instructions reviewed and explained. Voiced understanding.  

## 2014-11-17 NOTE — ED Notes (Signed)
Pt fell yesterday. Pt states he slipped and went down 3 steps, landing on steps. Pt states pain to his right rib area when coughing or sneezing, pain to mid back, and pain to right hand with bruising to 3rd and 4th digits.

## 2014-11-19 ENCOUNTER — Other Ambulatory Visit (HOSPITAL_COMMUNITY): Payer: Self-pay | Admitting: Family Medicine

## 2014-11-19 DIAGNOSIS — M25512 Pain in left shoulder: Secondary | ICD-10-CM

## 2014-11-19 DIAGNOSIS — M79602 Pain in left arm: Secondary | ICD-10-CM

## 2014-11-19 NOTE — ED Provider Notes (Signed)
CSN: [id number]     Arrival date & time 11/17/14  1429 History   First MD Initiated Contact with Patient 11/17/14 [phone]     Chief Complaint  Patient presents with  . Pain     (Consider location/radiation/quality/duration/timing/severity/associated sxs/prior Treatment) The history is provided by the patient.   Chad Butler is a 64 y.o. male presenting with painful right lateral rib cage and hand with swelling and bruising along the dorsal lateral edge of the hand since falling yesterday.  He slipped on ice going down steps, slid down 3, and landed against a step edge on his right side.  He denies head injury, loc, also denies nausea, vomiting, abdominal pain.  He denies sob but does have increased pain with deep inspiration at the site of the injury.  He has taken ibuprofen without relief of pain.     Past Medical History  Diagnosis Date  . Anxiety   . Hemorrhoids   . Hypertension   . Depression    Past Surgical History  Procedure Laterality Date  . Cholecystectomy  2001  . Left shoulder surgery  2011  . Colonoscopy  11/09/2011    Procedure: COLONOSCOPY;  Surgeon: Dorothyann Peng, MD;  Location: [facility];  Service: Endoscopy;  Laterality: N/A;  12:30 PM  . Esophagogastroduodenoscopy  10/07/2012    Procedure: ESOPHAGOGASTRODUODENOSCOPY (EGD);  Surgeon: Jamesetta So, MD;  Location: [facility];  Service: Gastroenterology;  Laterality: N/A;  . Hernia repair  1956  . Cyst excision  1970's    back  . Inguinal hernia repair Right 11/04/2013    Procedure: RECURRENT RIGHT INGUINAL HERNIA REPAIR;  Surgeon: Jamesetta So, MD;  Location: [facility];  Service: General;  Laterality: Right;  . Lesion removal N/A 11/04/2013    Procedure: EXCISION OF SKIN LESION ABDOMINAL WALL;  Surgeon: Jamesetta So, MD;  Location: [facility];  Service: General;  Laterality: N/A;   Family History  Problem Relation Age of Onset  . Colon cancer Neg Hx    History  Substance Use Topics  . Smoking status: Never  Smoker   . Smokeless tobacco: Not on file  . Alcohol Use: 3.0 oz/week    5 Glasses of wine per week    Review of Systems  Constitutional: Negative for fever and chills.  Respiratory: Negative for shortness of breath.   Musculoskeletal: Positive for back pain and arthralgias. Negative for myalgias, joint swelling and neck pain.  Skin: Positive for color change. Negative for wound.  Neurological: Negative for dizziness, weakness, numbness and headaches.      Allergies  Red dye and Gadolinium  Home Medications   Prior to Admission medications   Medication Sig Start Date End Date Taking? Authorizing Provider  ALPRAZolam Duanne Moron) 0.5 MG tablet Take 0.5 mg by mouth at bedtime. For sleep.   Yes Historical Provider, MD  aspirin EC 81 MG tablet Take 81 mg by mouth daily.   Yes Historical Provider, MD  escitalopram (LEXAPRO) 10 MG tablet Take 10 mg by mouth daily.   Yes Historical Provider, MD  ibuprofen (ADVIL,MOTRIN) 200 MG tablet Take 400 mg by mouth every 8 (eight) hours as needed.   Yes Historical Provider, MD  metoprolol tartrate (LOPRESSOR) 25 MG tablet Take 25 mg by mouth daily. 02/01/14  Yes Historical Provider, MD  Multiple Vitamin (MULITIVITAMIN WITH MINERALS) TABS Take 1 tablet by mouth every morning.    Yes Historical Provider, MD  naproxen sodium (ANAPROX) 220 MG tablet Take 220  mg by mouth daily as needed (for pain).   Yes Historical Provider, MD  oxyCODONE-acetaminophen (PERCOCET) 7.5-325 MG per tablet Take 1-2 tablets by mouth every 4 (four) hours as needed. Patient not taking: Reported on 11/17/2014 11/04/13   Jamesetta So, MD   BP 168/88 mmHg  Pulse 80  Temp(Src) 97.7 F (36.5 C) (Oral)  Resp 16  Ht 6' (1.829 m)  Wt 165 lb (74.844 kg)  BMI 22.37 kg/m2  SpO2 99% Physical Exam  Constitutional: He appears well-developed and well-nourished.  HENT:  Head: Atraumatic.  Eyes: Conjunctivae are normal.  Neck: Normal range of motion.  nontender midline.  Cardiovascular:    Pulses equal bilaterally  Pulmonary/Chest: Breath sounds normal. No respiratory distress. He has no rales. He exhibits tenderness.  Reduced effort secondary to pain.  Point tender right lateral ribs mid thoracic.    Musculoskeletal: He exhibits tenderness.       Right hand: Normal sensation noted. Normal strength noted.       Hands: Neurological: He is alert. He has normal strength. He displays normal reflexes. No sensory deficit.  Skin: Skin is warm and dry.  Ecchymosis right dorsolateral hand, no hematoma, skin intact.  No bruising, swelling of back/chest wall.  Psychiatric: He has a normal mood and affect.    ED Course  Procedures (including critical care time) Labs Review Labs Reviewed - No data to display  Imaging Review No results found.   EKG Interpretation None      MDM   Final diagnoses:  Fall  Hand contusion, right, initial encounter  Rib contusion, right, initial encounter    Patients labs and/or radiological studies were viewed and considered during the medical decision making and disposition process.  Pt was offered pain medication which he defers.  He was encouraged ice, elevation of his hand contusion.  Also encouraged ice for his chest wall pain.  Expect gradual improvement in symptoms.  When necessary follow-up with PCP if not improving over the next 7-10 days.     Evalee Jefferson, PA-C 11/19/14 [place], DO 11/20/14 [phone]

## 2014-11-24 ENCOUNTER — Ambulatory Visit (HOSPITAL_COMMUNITY)
Admission: RE | Admit: 2014-11-24 | Discharge: 2014-11-24 | Disposition: A | Discharge status: Home / Self Care | Payer: Federal, State, Local not specified - PPO | Source: Ambulatory Visit | Attending: Family Medicine | Admitting: Family Medicine

## 2014-11-24 DIAGNOSIS — M79602 Pain in left arm: Secondary | ICD-10-CM

## 2014-11-24 DIAGNOSIS — M25512 Pain in left shoulder: Secondary | ICD-10-CM | POA: Insufficient documentation

## 2014-11-24 DIAGNOSIS — R937 Abnormal findings on diagnostic imaging of other parts of musculoskeletal system: Secondary | ICD-10-CM | POA: Diagnosis not present

## 2014-12-03 DIAGNOSIS — M19012 Primary osteoarthritis, left shoulder: Secondary | ICD-10-CM | POA: Diagnosis not present

## 2014-12-29 DIAGNOSIS — M7502 Adhesive capsulitis of left shoulder: Secondary | ICD-10-CM | POA: Diagnosis not present

## 2014-12-29 DIAGNOSIS — M7552 Bursitis of left shoulder: Secondary | ICD-10-CM | POA: Diagnosis not present

## 2014-12-29 DIAGNOSIS — M75112 Incomplete rotator cuff tear or rupture of left shoulder, not specified as traumatic: Secondary | ICD-10-CM | POA: Diagnosis not present

## 2014-12-29 DIAGNOSIS — M7542 Impingement syndrome of left shoulder: Secondary | ICD-10-CM | POA: Diagnosis not present

## 2014-12-29 DIAGNOSIS — G8918 Other acute postprocedural pain: Secondary | ICD-10-CM | POA: Diagnosis not present

## 2014-12-29 DIAGNOSIS — M24112 Other articular cartilage disorders, left shoulder: Secondary | ICD-10-CM | POA: Diagnosis not present

## 2014-12-30 DIAGNOSIS — M25612 Stiffness of left shoulder, not elsewhere classified: Secondary | ICD-10-CM | POA: Diagnosis not present

## 2014-12-30 DIAGNOSIS — M25512 Pain in left shoulder: Secondary | ICD-10-CM | POA: Diagnosis not present

## 2014-12-30 DIAGNOSIS — R293 Abnormal posture: Secondary | ICD-10-CM | POA: Diagnosis not present

## 2014-12-30 DIAGNOSIS — M79622 Pain in left upper arm: Secondary | ICD-10-CM | POA: Diagnosis not present

## 2014-12-31 DIAGNOSIS — M25512 Pain in left shoulder: Secondary | ICD-10-CM | POA: Diagnosis not present

## 2014-12-31 DIAGNOSIS — M25612 Stiffness of left shoulder, not elsewhere classified: Secondary | ICD-10-CM | POA: Diagnosis not present

## 2014-12-31 DIAGNOSIS — R293 Abnormal posture: Secondary | ICD-10-CM | POA: Diagnosis not present

## 2014-12-31 DIAGNOSIS — M79622 Pain in left upper arm: Secondary | ICD-10-CM | POA: Diagnosis not present

## 2015-01-03 DIAGNOSIS — M79622 Pain in left upper arm: Secondary | ICD-10-CM | POA: Diagnosis not present

## 2015-01-03 DIAGNOSIS — M25512 Pain in left shoulder: Secondary | ICD-10-CM | POA: Diagnosis not present

## 2015-01-03 DIAGNOSIS — M25612 Stiffness of left shoulder, not elsewhere classified: Secondary | ICD-10-CM | POA: Diagnosis not present

## 2015-01-03 DIAGNOSIS — R293 Abnormal posture: Secondary | ICD-10-CM | POA: Diagnosis not present

## 2015-01-05 DIAGNOSIS — R293 Abnormal posture: Secondary | ICD-10-CM | POA: Diagnosis not present

## 2015-01-05 DIAGNOSIS — M79622 Pain in left upper arm: Secondary | ICD-10-CM | POA: Diagnosis not present

## 2015-01-05 DIAGNOSIS — M25512 Pain in left shoulder: Secondary | ICD-10-CM | POA: Diagnosis not present

## 2015-01-05 DIAGNOSIS — M25612 Stiffness of left shoulder, not elsewhere classified: Secondary | ICD-10-CM | POA: Diagnosis not present

## 2015-01-06 DIAGNOSIS — M25612 Stiffness of left shoulder, not elsewhere classified: Secondary | ICD-10-CM | POA: Diagnosis not present

## 2015-01-06 DIAGNOSIS — M25512 Pain in left shoulder: Secondary | ICD-10-CM | POA: Diagnosis not present

## 2015-01-06 DIAGNOSIS — M79622 Pain in left upper arm: Secondary | ICD-10-CM | POA: Diagnosis not present

## 2015-01-06 DIAGNOSIS — R293 Abnormal posture: Secondary | ICD-10-CM | POA: Diagnosis not present

## 2015-01-10 DIAGNOSIS — M25612 Stiffness of left shoulder, not elsewhere classified: Secondary | ICD-10-CM | POA: Diagnosis not present

## 2015-01-10 DIAGNOSIS — M79622 Pain in left upper arm: Secondary | ICD-10-CM | POA: Diagnosis not present

## 2015-01-10 DIAGNOSIS — R293 Abnormal posture: Secondary | ICD-10-CM | POA: Diagnosis not present

## 2015-01-10 DIAGNOSIS — M25512 Pain in left shoulder: Secondary | ICD-10-CM | POA: Diagnosis not present

## 2015-01-12 DIAGNOSIS — M79622 Pain in left upper arm: Secondary | ICD-10-CM | POA: Diagnosis not present

## 2015-01-12 DIAGNOSIS — M25612 Stiffness of left shoulder, not elsewhere classified: Secondary | ICD-10-CM | POA: Diagnosis not present

## 2015-01-12 DIAGNOSIS — M25512 Pain in left shoulder: Secondary | ICD-10-CM | POA: Diagnosis not present

## 2015-01-12 DIAGNOSIS — R293 Abnormal posture: Secondary | ICD-10-CM | POA: Diagnosis not present

## 2015-01-13 DIAGNOSIS — M79622 Pain in left upper arm: Secondary | ICD-10-CM | POA: Diagnosis not present

## 2015-01-13 DIAGNOSIS — R293 Abnormal posture: Secondary | ICD-10-CM | POA: Diagnosis not present

## 2015-01-13 DIAGNOSIS — M25612 Stiffness of left shoulder, not elsewhere classified: Secondary | ICD-10-CM | POA: Diagnosis not present

## 2015-01-13 DIAGNOSIS — M25512 Pain in left shoulder: Secondary | ICD-10-CM | POA: Diagnosis not present

## 2015-01-17 DIAGNOSIS — M25512 Pain in left shoulder: Secondary | ICD-10-CM | POA: Diagnosis not present

## 2015-01-17 DIAGNOSIS — R293 Abnormal posture: Secondary | ICD-10-CM | POA: Diagnosis not present

## 2015-01-17 DIAGNOSIS — M25612 Stiffness of left shoulder, not elsewhere classified: Secondary | ICD-10-CM | POA: Diagnosis not present

## 2015-01-17 DIAGNOSIS — M79622 Pain in left upper arm: Secondary | ICD-10-CM | POA: Diagnosis not present

## 2015-01-19 DIAGNOSIS — M25512 Pain in left shoulder: Secondary | ICD-10-CM | POA: Diagnosis not present

## 2015-01-19 DIAGNOSIS — M25612 Stiffness of left shoulder, not elsewhere classified: Secondary | ICD-10-CM | POA: Diagnosis not present

## 2015-01-19 DIAGNOSIS — M79622 Pain in left upper arm: Secondary | ICD-10-CM | POA: Diagnosis not present

## 2015-01-19 DIAGNOSIS — R293 Abnormal posture: Secondary | ICD-10-CM | POA: Diagnosis not present

## 2015-01-20 DIAGNOSIS — M25612 Stiffness of left shoulder, not elsewhere classified: Secondary | ICD-10-CM | POA: Diagnosis not present

## 2015-01-20 DIAGNOSIS — R293 Abnormal posture: Secondary | ICD-10-CM | POA: Diagnosis not present

## 2015-01-20 DIAGNOSIS — M79622 Pain in left upper arm: Secondary | ICD-10-CM | POA: Diagnosis not present

## 2015-01-20 DIAGNOSIS — M25512 Pain in left shoulder: Secondary | ICD-10-CM | POA: Diagnosis not present

## 2015-01-24 DIAGNOSIS — M25512 Pain in left shoulder: Secondary | ICD-10-CM | POA: Diagnosis not present

## 2015-01-24 DIAGNOSIS — M79622 Pain in left upper arm: Secondary | ICD-10-CM | POA: Diagnosis not present

## 2015-01-24 DIAGNOSIS — R293 Abnormal posture: Secondary | ICD-10-CM | POA: Diagnosis not present

## 2015-01-24 DIAGNOSIS — M25612 Stiffness of left shoulder, not elsewhere classified: Secondary | ICD-10-CM | POA: Diagnosis not present

## 2015-01-26 DIAGNOSIS — M25612 Stiffness of left shoulder, not elsewhere classified: Secondary | ICD-10-CM | POA: Diagnosis not present

## 2015-01-26 DIAGNOSIS — R293 Abnormal posture: Secondary | ICD-10-CM | POA: Diagnosis not present

## 2015-01-26 DIAGNOSIS — M25512 Pain in left shoulder: Secondary | ICD-10-CM | POA: Diagnosis not present

## 2015-01-26 DIAGNOSIS — M79622 Pain in left upper arm: Secondary | ICD-10-CM | POA: Diagnosis not present

## 2015-01-27 DIAGNOSIS — M79622 Pain in left upper arm: Secondary | ICD-10-CM | POA: Diagnosis not present

## 2015-01-27 DIAGNOSIS — M25512 Pain in left shoulder: Secondary | ICD-10-CM | POA: Diagnosis not present

## 2015-01-27 DIAGNOSIS — M25612 Stiffness of left shoulder, not elsewhere classified: Secondary | ICD-10-CM | POA: Diagnosis not present

## 2015-01-27 DIAGNOSIS — R293 Abnormal posture: Secondary | ICD-10-CM | POA: Diagnosis not present

## 2015-01-31 DIAGNOSIS — M79622 Pain in left upper arm: Secondary | ICD-10-CM | POA: Diagnosis not present

## 2015-01-31 DIAGNOSIS — R293 Abnormal posture: Secondary | ICD-10-CM | POA: Diagnosis not present

## 2015-01-31 DIAGNOSIS — M25612 Stiffness of left shoulder, not elsewhere classified: Secondary | ICD-10-CM | POA: Diagnosis not present

## 2015-01-31 DIAGNOSIS — M25512 Pain in left shoulder: Secondary | ICD-10-CM | POA: Diagnosis not present

## 2015-02-02 DIAGNOSIS — M25612 Stiffness of left shoulder, not elsewhere classified: Secondary | ICD-10-CM | POA: Diagnosis not present

## 2015-02-02 DIAGNOSIS — R293 Abnormal posture: Secondary | ICD-10-CM | POA: Diagnosis not present

## 2015-02-02 DIAGNOSIS — M79622 Pain in left upper arm: Secondary | ICD-10-CM | POA: Diagnosis not present

## 2015-02-02 DIAGNOSIS — M25512 Pain in left shoulder: Secondary | ICD-10-CM | POA: Diagnosis not present

## 2015-02-03 DIAGNOSIS — R293 Abnormal posture: Secondary | ICD-10-CM | POA: Diagnosis not present

## 2015-02-03 DIAGNOSIS — M25512 Pain in left shoulder: Secondary | ICD-10-CM | POA: Diagnosis not present

## 2015-02-03 DIAGNOSIS — M79622 Pain in left upper arm: Secondary | ICD-10-CM | POA: Diagnosis not present

## 2015-02-03 DIAGNOSIS — M25612 Stiffness of left shoulder, not elsewhere classified: Secondary | ICD-10-CM | POA: Diagnosis not present

## 2015-02-07 DIAGNOSIS — M79622 Pain in left upper arm: Secondary | ICD-10-CM | POA: Diagnosis not present

## 2015-02-07 DIAGNOSIS — R293 Abnormal posture: Secondary | ICD-10-CM | POA: Diagnosis not present

## 2015-02-07 DIAGNOSIS — M25512 Pain in left shoulder: Secondary | ICD-10-CM | POA: Diagnosis not present

## 2015-02-07 DIAGNOSIS — M25612 Stiffness of left shoulder, not elsewhere classified: Secondary | ICD-10-CM | POA: Diagnosis not present

## 2015-02-09 DIAGNOSIS — M25512 Pain in left shoulder: Secondary | ICD-10-CM | POA: Diagnosis not present

## 2015-02-09 DIAGNOSIS — M25612 Stiffness of left shoulder, not elsewhere classified: Secondary | ICD-10-CM | POA: Diagnosis not present

## 2015-02-09 DIAGNOSIS — R293 Abnormal posture: Secondary | ICD-10-CM | POA: Diagnosis not present

## 2015-02-09 DIAGNOSIS — M79622 Pain in left upper arm: Secondary | ICD-10-CM | POA: Diagnosis not present

## 2015-02-10 DIAGNOSIS — R293 Abnormal posture: Secondary | ICD-10-CM | POA: Diagnosis not present

## 2015-02-10 DIAGNOSIS — M25612 Stiffness of left shoulder, not elsewhere classified: Secondary | ICD-10-CM | POA: Diagnosis not present

## 2015-02-10 DIAGNOSIS — M79622 Pain in left upper arm: Secondary | ICD-10-CM | POA: Diagnosis not present

## 2015-02-10 DIAGNOSIS — M25512 Pain in left shoulder: Secondary | ICD-10-CM | POA: Diagnosis not present

## 2015-02-14 DIAGNOSIS — R293 Abnormal posture: Secondary | ICD-10-CM | POA: Diagnosis not present

## 2015-02-14 DIAGNOSIS — M25612 Stiffness of left shoulder, not elsewhere classified: Secondary | ICD-10-CM | POA: Diagnosis not present

## 2015-02-14 DIAGNOSIS — M79622 Pain in left upper arm: Secondary | ICD-10-CM | POA: Diagnosis not present

## 2015-02-14 DIAGNOSIS — M25512 Pain in left shoulder: Secondary | ICD-10-CM | POA: Diagnosis not present

## 2015-02-16 DIAGNOSIS — M79622 Pain in left upper arm: Secondary | ICD-10-CM | POA: Diagnosis not present

## 2015-02-16 DIAGNOSIS — M25612 Stiffness of left shoulder, not elsewhere classified: Secondary | ICD-10-CM | POA: Diagnosis not present

## 2015-02-16 DIAGNOSIS — R293 Abnormal posture: Secondary | ICD-10-CM | POA: Diagnosis not present

## 2015-02-16 DIAGNOSIS — M25512 Pain in left shoulder: Secondary | ICD-10-CM | POA: Diagnosis not present

## 2015-02-17 DIAGNOSIS — M25612 Stiffness of left shoulder, not elsewhere classified: Secondary | ICD-10-CM | POA: Diagnosis not present

## 2015-02-17 DIAGNOSIS — M79622 Pain in left upper arm: Secondary | ICD-10-CM | POA: Diagnosis not present

## 2015-02-17 DIAGNOSIS — R293 Abnormal posture: Secondary | ICD-10-CM | POA: Diagnosis not present

## 2015-02-17 DIAGNOSIS — M25512 Pain in left shoulder: Secondary | ICD-10-CM | POA: Diagnosis not present

## 2015-02-21 DIAGNOSIS — M25612 Stiffness of left shoulder, not elsewhere classified: Secondary | ICD-10-CM | POA: Diagnosis not present

## 2015-02-21 DIAGNOSIS — M25512 Pain in left shoulder: Secondary | ICD-10-CM | POA: Diagnosis not present

## 2015-02-21 DIAGNOSIS — M79622 Pain in left upper arm: Secondary | ICD-10-CM | POA: Diagnosis not present

## 2015-02-21 DIAGNOSIS — R293 Abnormal posture: Secondary | ICD-10-CM | POA: Diagnosis not present

## 2015-02-23 DIAGNOSIS — R293 Abnormal posture: Secondary | ICD-10-CM | POA: Diagnosis not present

## 2015-02-23 DIAGNOSIS — M25512 Pain in left shoulder: Secondary | ICD-10-CM | POA: Diagnosis not present

## 2015-02-23 DIAGNOSIS — M79622 Pain in left upper arm: Secondary | ICD-10-CM | POA: Diagnosis not present

## 2015-02-23 DIAGNOSIS — M25612 Stiffness of left shoulder, not elsewhere classified: Secondary | ICD-10-CM | POA: Diagnosis not present

## 2015-02-24 DIAGNOSIS — M25512 Pain in left shoulder: Secondary | ICD-10-CM | POA: Diagnosis not present

## 2015-02-24 DIAGNOSIS — M79622 Pain in left upper arm: Secondary | ICD-10-CM | POA: Diagnosis not present

## 2015-02-24 DIAGNOSIS — M25612 Stiffness of left shoulder, not elsewhere classified: Secondary | ICD-10-CM | POA: Diagnosis not present

## 2015-02-24 DIAGNOSIS — R293 Abnormal posture: Secondary | ICD-10-CM | POA: Diagnosis not present

## 2015-04-18 ENCOUNTER — Encounter (HOSPITAL_COMMUNITY): Payer: Self-pay | Admitting: *Deleted

## 2015-04-18 ENCOUNTER — Emergency Department (HOSPITAL_COMMUNITY)
Admission: EM | Admit: 2015-04-18 | Discharge: 2015-04-18 | Disposition: A | Discharge status: Home / Self Care | Payer: Federal, State, Local not specified - PPO | Attending: Emergency Medicine | Admitting: Emergency Medicine

## 2015-04-18 ENCOUNTER — Emergency Department (HOSPITAL_COMMUNITY): Payer: Federal, State, Local not specified - PPO

## 2015-04-18 DIAGNOSIS — Z79899 Other long term (current) drug therapy: Secondary | ICD-10-CM | POA: Insufficient documentation

## 2015-04-18 DIAGNOSIS — F329 Major depressive disorder, single episode, unspecified: Secondary | ICD-10-CM | POA: Diagnosis not present

## 2015-04-18 DIAGNOSIS — F419 Anxiety disorder, unspecified: Secondary | ICD-10-CM | POA: Diagnosis not present

## 2015-04-18 DIAGNOSIS — R079 Chest pain, unspecified: Secondary | ICD-10-CM | POA: Diagnosis not present

## 2015-04-18 DIAGNOSIS — I10 Essential (primary) hypertension: Secondary | ICD-10-CM | POA: Diagnosis not present

## 2015-04-18 DIAGNOSIS — R0789 Other chest pain: Secondary | ICD-10-CM | POA: Insufficient documentation

## 2015-04-18 DIAGNOSIS — Z7982 Long term (current) use of aspirin: Secondary | ICD-10-CM | POA: Insufficient documentation

## 2015-04-18 LAB — BASIC METABOLIC PANEL
Anion gap: 11 (ref 5–15)
BUN: 17 mg/dL (ref 6–20)
CO2: 26 mmol/L (ref 22–32)
CREATININE: 0.93 mg/dL (ref 0.61–1.24)
Calcium: 9.2 mg/dL (ref 8.9–10.3)
Chloride: 101 mmol/L (ref 101–111)
Glucose, Bld: 113 mg/dL — ABNORMAL HIGH (ref 65–99)
POTASSIUM: 4.2 mmol/L (ref 3.5–5.1)
Sodium: 138 mmol/L (ref 135–145)

## 2015-04-18 LAB — CBC WITH DIFFERENTIAL/PLATELET
BASOS PCT: 1 % (ref 0–1)
Basophils Absolute: 0.1 10*3/uL (ref 0.0–0.1)
EOS ABS: 0.2 10*3/uL (ref 0.0–0.7)
EOS PCT: 2 % (ref 0–5)
HCT: 50.7 % (ref 39.0–52.0)
Hemoglobin: 17.4 g/dL — ABNORMAL HIGH (ref 13.0–17.0)
LYMPHS ABS: 2 10*3/uL (ref 0.7–4.0)
LYMPHS PCT: 21 % (ref 12–46)
MCH: 32 pg (ref 26.0–34.0)
MCHC: 34.3 g/dL (ref 30.0–36.0)
MCV: 93.2 fL (ref 78.0–100.0)
MONOS PCT: 12 % (ref 3–12)
Monocytes Absolute: 1.1 10*3/uL — ABNORMAL HIGH (ref 0.1–1.0)
Neutro Abs: 5.8 10*3/uL (ref 1.7–7.7)
Neutrophils Relative %: 64 % (ref 43–77)
Platelets: 282 10*3/uL (ref 150–400)
RBC: 5.44 MIL/uL (ref 4.22–5.81)
RDW: 12.9 % (ref 11.5–15.5)
WBC: 9.1 10*3/uL (ref 4.0–10.5)

## 2015-04-18 LAB — TROPONIN I

## 2015-04-18 MED ORDER — SODIUM CHLORIDE 0.9 % IV SOLN
INTRAVENOUS | Status: DC
  Administered 2015-04-18: 11:00:00 via INTRAVENOUS

## 2015-04-18 MED ORDER — ASPIRIN 81 MG PO CHEW
162.0000 mg | CHEWABLE_TABLET | Freq: Once | ORAL | Status: AC
  Administered 2015-04-18: 162 mg via ORAL
  Filled 2015-04-18: qty 2

## 2015-04-18 MED ORDER — TRAMADOL HCL 50 MG PO TABS
50.0000 mg | ORAL_TABLET | Freq: Four times a day (QID) | ORAL | Status: DC | PRN
Start: 2015-04-18 — End: 2017-03-08

## 2015-04-18 NOTE — Discharge Instructions (Signed)
Chest Wall Pain Chest wall pain is pain in or around the bones and muscles of your chest. It may take up to 6 weeks to get better. It may take longer if you must stay physically active in your work and activities.  CAUSES  Chest wall pain may happen on its own. However, it may be caused by:  A viral illness like the flu.  Injury.  Coughing.  Exercise.  Arthritis.  Fibromyalgia.  Shingles. HOME CARE INSTRUCTIONS   Avoid overtiring physical activity. Try not to strain or perform activities that cause pain. This includes any activities using your chest or your abdominal and side muscles, especially if heavy weights are used.  Put ice on the sore area.  Put ice in a plastic bag.  Place a towel between your skin and the bag.  Leave the ice on for 15-20 minutes per hour while awake for the first 2 days.  Only take over-the-counter or prescription medicines for pain, discomfort, or fever as directed by your caregiver. SEEK IMMEDIATE MEDICAL CARE IF:   Your pain increases, or you are very uncomfortable.  You have a fever.  Your chest pain becomes worse.  You have new, unexplained symptoms.  You have nausea or vomiting.  You feel sweaty or lightheaded.  You have a cough with phlegm (sputum), or you cough up blood. MAKE SURE YOU:   Understand these instructions.  Will watch your condition.  Will get help right away if you are not doing well or get worse. Document Released: 09/17/2005 Document Revised: 12/10/2011 Document Reviewed: 05/14/2011 [facility] Patient Information 2015 [place], [place]. This information is not intended to replace advice given to you by your health care provider. Make sure you discuss any questions you have with your health care provider. Take pain medicine as needed. Make Plans to make appointment with your regular doctor. Return for any new or worse symptoms or chest pain lasting for 20 minutes or longer.

## 2015-04-18 NOTE — ED Notes (Signed)
Patient c/o chest pain that started last night in left side of chest. Pain is sporadic, hurts with he coughs.

## 2015-04-18 NOTE — ED Notes (Signed)
Patient with no complaints at this time. Respirations even and unlabored. Skin warm/dry. Discharge instructions reviewed with patient at this time. Patient given opportunity to voice concerns/ask questions. IV removed per policy and band-aid applied to site. Patient discharged at this time and left Emergency Department with steady gait.  

## 2015-04-18 NOTE — ED Provider Notes (Signed)
CSN: [id number]     Arrival date & time 04/18/15  3244 History  This chart was scribed for Fredia Sorrow, MD by Terressa Koyanagi, ED Scribe. This patient was seen in room [facility]07/[facility]07 and the patient's care was started at 10:39 AM.   Chief Complaint  Patient presents with  . Chest Pain   Patient is a 64 y.o. male presenting with chest pain. The history is provided by the patient. No language interpreter was used.  Chest Pain Pain location:  L chest Pain quality: throbbing   Pain severity:  Severe Onset quality:  Sudden Duration:  1 day Timing:  Intermittent Chronicity:  New Associated symptoms: no abdominal pain, no back pain, no fever, no headache, no nausea, no numbness, no shortness of breath and not vomiting   Risk factors: hypertension   Risk factors: no diabetes mellitus and no smoking    PCP: Glo Herring., MD HPI Comments: Chad Butler is a 64 y.o. male, with PMHx noted below including HTN, who presents to the Emergency Department complaining of acute, intermittent, atraumatic, sudden onset, 7/10, throbbing, left sided chest pain onset yesterday at 8am. Pt reports each episode of pain lasts approximately one minute. Pt states episodes of chest pain last night around 8pm was worse than any other episodes. Pt notes coughing and twisting aggravates the pain. Pt denies fever, chills, visual changes, cold Sx (sore throat, cough, runny nose), SOB, abd pain, n/v/d, dysuria, swelling of BLE, rash, Hx of bleeding easily (pt notes he takes a daily baby aspirin only), back pain, headache, numbness, or any other Sx at this time.  Past Medical History  Diagnosis Date  . Anxiety   . Hemorrhoids   . Depression   . Hypertension    Past Surgical History  Procedure Laterality Date  . Cholecystectomy  2001  . Left shoulder surgery  2011  . Colonoscopy  11/09/2011    Procedure: COLONOSCOPY;  Surgeon: Dorothyann Peng, MD;  Location: [facility];  Service: Endoscopy;  Laterality: N/A;  12:30  PM  . Esophagogastroduodenoscopy  10/07/2012    Procedure: ESOPHAGOGASTRODUODENOSCOPY (EGD);  Surgeon: Jamesetta So, MD;  Location: [facility];  Service: Gastroenterology;  Laterality: N/A;  . Hernia repair  1956  . Cyst excision  1970's    back  . Inguinal hernia repair Right 11/04/2013    Procedure: RECURRENT RIGHT INGUINAL HERNIA REPAIR;  Surgeon: Jamesetta So, MD;  Location: [facility];  Service: General;  Laterality: Right;  . Lesion removal N/A 11/04/2013    Procedure: EXCISION OF SKIN LESION ABDOMINAL WALL;  Surgeon: Jamesetta So, MD;  Location: [facility];  Service: General;  Laterality: N/A;   Family History  Problem Relation Age of Onset  . Colon cancer Neg Hx    History  Substance Use Topics  . Smoking status: Never Smoker   . Smokeless tobacco: Not on file  . Alcohol Use: 3.0 oz/week    5 Glasses of wine per week    Review of Systems  Constitutional: Negative for fever and chills.  HENT: Negative for congestion, rhinorrhea and sore throat.   Eyes: Negative for visual disturbance.  Respiratory: Negative for shortness of breath.   Cardiovascular: Positive for chest pain.  Gastrointestinal: Negative for nausea, vomiting, abdominal pain and diarrhea.  Genitourinary: Negative for dysuria.  Musculoskeletal: Negative for back pain.  Skin: Negative for rash.  Neurological: Negative for numbness and headaches.  Hematological: Does not bruise/bleed easily.  Psychiatric/Behavioral: Negative for confusion.  Allergies  Red dye and Gadolinium  Home Medications   Prior to Admission medications   Medication Sig Start Date End Date Taking? Authorizing Provider  ALPRAZolam Duanne Moron) 0.5 MG tablet Take 0.5 mg by mouth at bedtime. For sleep.   Yes Historical Provider, MD  aspirin EC 81 MG tablet Take 81 mg by mouth daily.   Yes Historical Provider, MD  escitalopram (LEXAPRO) 10 MG tablet Take 10 mg by mouth daily.   Yes Historical Provider, MD  metoprolol tartrate (LOPRESSOR)  25 MG tablet Take 25 mg by mouth daily. 02/01/14  Yes Historical Provider, MD  Multiple Vitamin (MULITIVITAMIN WITH MINERALS) TABS Take 1 tablet by mouth every morning.    Yes Historical Provider, MD  naproxen sodium (ANAPROX) 220 MG tablet Take 220 mg by mouth daily as needed (for pain).   Yes Historical Provider, MD   Triage Vitals: Pulse 79  Temp(Src) 97.9 F (36.6 C) (Oral)  Resp 22  Ht 6' (1.829 m)  Wt 165 lb (74.844 kg)  BMI 22.37 kg/m2  SpO2 99% Physical Exam  Constitutional: He is oriented to person, place, and time. He appears well-developed and well-nourished.  HENT:  Head: Normocephalic and atraumatic.  Mouth/Throat: Mucous membranes are normal.  Eyes: EOM are normal. Pupils are equal, round, and reactive to light. No scleral icterus.  Eyes tract normal.   Neck: Normal range of motion.  Cardiovascular: Normal rate, regular rhythm, normal heart sounds and intact distal pulses.   No murmur heard. Pulmonary/Chest: Effort normal and breath sounds normal. No respiratory distress.  Abdominal: Soft. Bowel sounds are normal. He exhibits no distension. There is no tenderness.  Musculoskeletal: Normal range of motion.       Right ankle: He exhibits no swelling.       Left ankle: He exhibits no swelling.  Neurological: He is alert and oriented to person, place, and time.  Pt able to move both sets of fingers and toes   Skin: Skin is warm and dry.  Psychiatric: He has a normal mood and affect. Judgment normal.  Nursing note and vitals reviewed.   ED Course  Procedures (including critical care time) DIAGNOSTIC STUDIES: Oxygen Saturation is 99% on RA, nl by my interpretation.    COORDINATION OF CARE: 10:47 AM-Discussed treatment plan with pt at bedside and pt agreed to plan.   Results for orders placed or performed during the hospital encounter of 50/53/97  Basic metabolic panel  Result Value Ref Range   Sodium 138 135 - 145 mmol/L   Potassium 4.2 3.5 - 5.1 mmol/L    Chloride 101 101 - 111 mmol/L   CO2 26 22 - 32 mmol/L   Glucose, Bld 113 (H) 65 - 99 mg/dL   BUN 17 6 - 20 mg/dL   Creatinine, Ser 0.93 0.61 - 1.24 mg/dL   Calcium 9.2 8.9 - 10.3 mg/dL   GFR calc non Af Amer >60 >60 mL/min   GFR calc Af Amer >60 >60 mL/min   Anion gap 11 5 - 15  CBC with Differential/Platelet  Result Value Ref Range   WBC 9.1 4.0 - 10.5 K/uL   RBC 5.44 4.22 - 5.81 MIL/uL   Hemoglobin 17.4 (H) 13.0 - 17.0 g/dL   HCT 50.7 39.0 - 52.0 %   MCV 93.2 78.0 - 100.0 fL   MCH 32.0 26.0 - 34.0 pg   MCHC 34.3 30.0 - 36.0 g/dL   RDW 12.9 11.5 - 15.5 %   Platelets 282 150 - 400 K/uL  Neutrophils Relative % 64 43 - 77 %   Neutro Abs 5.8 1.7 - 7.7 K/uL   Lymphocytes Relative 21 12 - 46 %   Lymphs Abs 2.0 0.7 - 4.0 K/uL   Monocytes Relative 12 3 - 12 %   Monocytes Absolute 1.1 (H) 0.1 - 1.0 K/uL   Eosinophils Relative 2 0 - 5 %   Eosinophils Absolute 0.2 0.0 - 0.7 K/uL   Basophils Relative 1 0 - 1 %   Basophils Absolute 0.1 0.0 - 0.1 K/uL  Troponin I  Result Value Ref Range   Troponin I <0.03 <0.031 ng/mL   Dg Chest 2 View  04/18/2015   CLINICAL DATA:  Hypertension.  Left-sided chest pain for 1 day  EXAM: CHEST  2 VIEW  COMPARISON:  November 17, 2014  FINDINGS: There is no edema or consolidation. The heart size and pulmonary vascularity are normal. No adenopathy. No pneumothorax. No bone lesions.  IMPRESSION: No edema or consolidation.   Electronically Signed   By: Lowella Grip III M.D.   On: 04/18/2015 10:39    Medications  0.9 %  sodium chloride infusion (not administered)     MDM   Final diagnoses:  Chest pain    Patient with chest pain intermittent brief in nature seems to be musculoskeletal worse with certain movements and cough. Workup negative chest x-ray negative for pneumonia pneumothorax pulmonary edema. Troponin negative EKG without acute changes. Patient's had symptoms since yesterday. Actually started in the morning got worse at 12 noon and then got  worse again at the 6 in the evening. Patient stable for discharge home follow-up with primary care doctor.    I personally performed the services described in this documentation, which was scribed in my presence. The recorded information has been reviewed and is accurate.     Fredia Sorrow, MD 04/18/15 1313

## 2015-11-18 DIAGNOSIS — Z6822 Body mass index (BMI) 22.0-22.9, adult: Secondary | ICD-10-CM | POA: Diagnosis not present

## 2015-11-18 DIAGNOSIS — M79602 Pain in left arm: Secondary | ICD-10-CM | POA: Diagnosis not present

## 2015-12-06 DIAGNOSIS — M7712 Lateral epicondylitis, left elbow: Secondary | ICD-10-CM | POA: Diagnosis not present

## 2015-12-06 DIAGNOSIS — Z1389 Encounter for screening for other disorder: Secondary | ICD-10-CM | POA: Diagnosis not present

## 2015-12-06 DIAGNOSIS — Z6821 Body mass index (BMI) 21.0-21.9, adult: Secondary | ICD-10-CM | POA: Diagnosis not present

## 2015-12-06 DIAGNOSIS — M6790 Unspecified disorder of synovium and tendon, unspecified site: Secondary | ICD-10-CM | POA: Diagnosis not present

## 2016-01-03 DIAGNOSIS — R5383 Other fatigue: Secondary | ICD-10-CM | POA: Diagnosis not present

## 2016-01-03 DIAGNOSIS — R6881 Early satiety: Secondary | ICD-10-CM | POA: Diagnosis not present

## 2016-01-03 DIAGNOSIS — Z6821 Body mass index (BMI) 21.0-21.9, adult: Secondary | ICD-10-CM | POA: Diagnosis not present

## 2016-01-03 DIAGNOSIS — R634 Abnormal weight loss: Secondary | ICD-10-CM | POA: Diagnosis not present

## 2016-01-03 DIAGNOSIS — R3129 Other microscopic hematuria: Secondary | ICD-10-CM | POA: Diagnosis not present

## 2016-01-03 DIAGNOSIS — Q8501 Neurofibromatosis, type 1: Secondary | ICD-10-CM | POA: Diagnosis not present

## 2016-01-06 DIAGNOSIS — R6881 Early satiety: Secondary | ICD-10-CM | POA: Diagnosis not present

## 2016-01-06 DIAGNOSIS — N281 Cyst of kidney, acquired: Secondary | ICD-10-CM | POA: Diagnosis not present

## 2016-01-06 DIAGNOSIS — Q8500 Neurofibromatosis, unspecified: Secondary | ICD-10-CM | POA: Diagnosis not present

## 2016-01-06 DIAGNOSIS — N329 Bladder disorder, unspecified: Secondary | ICD-10-CM | POA: Diagnosis not present

## 2016-01-09 ENCOUNTER — Other Ambulatory Visit (HOSPITAL_COMMUNITY): Payer: Self-pay | Admitting: Internal Medicine

## 2016-01-09 ENCOUNTER — Ambulatory Visit (HOSPITAL_COMMUNITY)
Admission: RE | Admit: 2016-01-09 | Discharge: 2016-01-09 | Disposition: A | Discharge status: Home / Self Care | Payer: Federal, State, Local not specified - PPO | Source: Ambulatory Visit | Attending: Internal Medicine | Admitting: Internal Medicine

## 2016-01-09 DIAGNOSIS — R05 Cough: Secondary | ICD-10-CM

## 2016-01-09 DIAGNOSIS — R059 Cough, unspecified: Secondary | ICD-10-CM

## 2016-01-09 DIAGNOSIS — R079 Chest pain, unspecified: Secondary | ICD-10-CM

## 2016-01-11 DIAGNOSIS — E279 Disorder of adrenal gland, unspecified: Secondary | ICD-10-CM | POA: Diagnosis not present

## 2016-01-11 DIAGNOSIS — N3289 Other specified disorders of bladder: Secondary | ICD-10-CM | POA: Diagnosis not present

## 2016-01-11 DIAGNOSIS — R39198 Other difficulties with micturition: Secondary | ICD-10-CM | POA: Diagnosis not present

## 2016-01-16 DIAGNOSIS — E278 Other specified disorders of adrenal gland: Secondary | ICD-10-CM | POA: Diagnosis not present

## 2016-01-16 DIAGNOSIS — E279 Disorder of adrenal gland, unspecified: Secondary | ICD-10-CM | POA: Diagnosis not present

## 2016-01-17 DIAGNOSIS — R8299 Other abnormal findings in urine: Secondary | ICD-10-CM | POA: Diagnosis not present

## 2016-01-17 DIAGNOSIS — N3289 Other specified disorders of bladder: Secondary | ICD-10-CM | POA: Diagnosis not present

## 2016-01-17 DIAGNOSIS — M19012 Primary osteoarthritis, left shoulder: Secondary | ICD-10-CM | POA: Diagnosis not present

## 2016-01-25 DIAGNOSIS — D3502 Benign neoplasm of left adrenal gland: Secondary | ICD-10-CM | POA: Diagnosis not present

## 2016-01-25 DIAGNOSIS — J209 Acute bronchitis, unspecified: Secondary | ICD-10-CM | POA: Diagnosis not present

## 2016-01-25 DIAGNOSIS — R634 Abnormal weight loss: Secondary | ICD-10-CM | POA: Diagnosis not present

## 2016-01-25 DIAGNOSIS — Z1389 Encounter for screening for other disorder: Secondary | ICD-10-CM | POA: Diagnosis not present

## 2016-01-25 DIAGNOSIS — Z6821 Body mass index (BMI) 21.0-21.9, adult: Secondary | ICD-10-CM | POA: Diagnosis not present

## 2016-01-25 DIAGNOSIS — F329 Major depressive disorder, single episode, unspecified: Secondary | ICD-10-CM | POA: Diagnosis not present

## 2016-01-25 DIAGNOSIS — R6881 Early satiety: Secondary | ICD-10-CM | POA: Diagnosis not present

## 2016-01-25 DIAGNOSIS — J9801 Acute bronchospasm: Secondary | ICD-10-CM | POA: Diagnosis not present

## 2016-01-25 DIAGNOSIS — F419 Anxiety disorder, unspecified: Secondary | ICD-10-CM | POA: Diagnosis not present

## 2016-01-25 DIAGNOSIS — Q8501 Neurofibromatosis, type 1: Secondary | ICD-10-CM | POA: Diagnosis not present

## 2016-01-31 DIAGNOSIS — Z91041 Radiographic dye allergy status: Secondary | ICD-10-CM | POA: Diagnosis not present

## 2016-01-31 DIAGNOSIS — Z9049 Acquired absence of other specified parts of digestive tract: Secondary | ICD-10-CM | POA: Diagnosis not present

## 2016-01-31 DIAGNOSIS — Z8249 Family history of ischemic heart disease and other diseases of the circulatory system: Secondary | ICD-10-CM | POA: Diagnosis not present

## 2016-01-31 DIAGNOSIS — R39198 Other difficulties with micturition: Secondary | ICD-10-CM | POA: Diagnosis not present

## 2016-01-31 DIAGNOSIS — F419 Anxiety disorder, unspecified: Secondary | ICD-10-CM | POA: Diagnosis not present

## 2016-01-31 DIAGNOSIS — N3289 Other specified disorders of bladder: Secondary | ICD-10-CM | POA: Diagnosis not present

## 2016-01-31 DIAGNOSIS — Z79899 Other long term (current) drug therapy: Secondary | ICD-10-CM | POA: Diagnosis not present

## 2016-01-31 DIAGNOSIS — R351 Nocturia: Secondary | ICD-10-CM | POA: Diagnosis not present

## 2016-01-31 DIAGNOSIS — Z809 Family history of malignant neoplasm, unspecified: Secondary | ICD-10-CM | POA: Diagnosis not present

## 2016-01-31 DIAGNOSIS — C672 Malignant neoplasm of lateral wall of bladder: Secondary | ICD-10-CM | POA: Diagnosis not present

## 2016-02-01 DIAGNOSIS — R351 Nocturia: Secondary | ICD-10-CM | POA: Diagnosis not present

## 2016-02-01 DIAGNOSIS — N3289 Other specified disorders of bladder: Secondary | ICD-10-CM | POA: Diagnosis not present

## 2016-02-01 DIAGNOSIS — N329 Bladder disorder, unspecified: Secondary | ICD-10-CM | POA: Diagnosis not present

## 2016-02-01 DIAGNOSIS — F419 Anxiety disorder, unspecified: Secondary | ICD-10-CM | POA: Diagnosis not present

## 2016-02-01 DIAGNOSIS — C672 Malignant neoplasm of lateral wall of bladder: Secondary | ICD-10-CM | POA: Diagnosis not present

## 2016-02-01 DIAGNOSIS — Z79899 Other long term (current) drug therapy: Secondary | ICD-10-CM | POA: Diagnosis not present

## 2016-02-01 DIAGNOSIS — R39198 Other difficulties with micturition: Secondary | ICD-10-CM | POA: Diagnosis not present

## 2016-02-01 DIAGNOSIS — C679 Malignant neoplasm of bladder, unspecified: Secondary | ICD-10-CM | POA: Diagnosis not present

## 2016-02-08 ENCOUNTER — Encounter: Payer: Self-pay | Admitting: Gastroenterology

## 2016-02-14 DIAGNOSIS — Z719 Counseling, unspecified: Secondary | ICD-10-CM | POA: Diagnosis not present

## 2016-02-15 DIAGNOSIS — G4730 Sleep apnea, unspecified: Secondary | ICD-10-CM | POA: Diagnosis not present

## 2016-02-17 ENCOUNTER — Ambulatory Visit: Payer: Federal, State, Local not specified - PPO | Admitting: Urology

## 2016-02-20 ENCOUNTER — Ambulatory Visit (INDEPENDENT_AMBULATORY_CARE_PROVIDER_SITE_OTHER): Payer: Federal, State, Local not specified - PPO | Admitting: Endocrinology

## 2016-02-20 ENCOUNTER — Encounter: Payer: Self-pay | Admitting: Endocrinology

## 2016-02-20 VITALS — BP 148/94 | HR 81 | Temp 98.9°F | Resp 16 | Ht 74.0 in | Wt 159.0 lb

## 2016-02-20 DIAGNOSIS — I10 Essential (primary) hypertension: Secondary | ICD-10-CM

## 2016-02-20 DIAGNOSIS — D3502 Benign neoplasm of left adrenal gland: Secondary | ICD-10-CM | POA: Diagnosis not present

## 2016-02-20 MED ORDER — DEXAMETHASONE 1 MG PO TABS: ORAL_TABLET | ORAL | Status: DC

## 2016-02-20 MED ORDER — DOXAZOSIN MESYLATE 2 MG PO TABS: 2.0000 mg | ORAL_TABLET | Freq: Every day | ORAL | Status: DC

## 2016-02-20 NOTE — Progress Notes (Addendum)
Patient ID: Chad Butler, male   DOB: July 08, 1951, 65 y.o.   MRN: [id number]           Chief complaint: Adrenal tumor on CT scan  History of Present Illness:   The patient has been having problems with weight loss and decreased appetite since early April This started when he developed a significant cough which subsequently resolved He still is not having any significant appetite especially after his first meal and has lost 7 pounds  Because of his weight loss he was sent for a CT scan of his abdomen which showed a 3.4-4 cm left adrenal adenoma of indeterminate significance.  This showed attenuation of 74 on the delayed phase data.    The patient gives a vague history of hypertension and details are not available in the patient's medical records He thinks he was given metoprolol for hypertension but has not taken it for some time, he does not know how long  He denies any problems with headaches, flushing, sweating episodes, palpitations. He also has not had any lightheadedness, nausea or weakness As above has lost weight Recent potassium level was 5.2  His labs included cortisol levels done on 01/03/16, he has 2 levels of 17.2 and 15.5 but not clear at what times these were drawn    Past Medical History  Diagnosis Date  . Anxiety   . Hemorrhoids   . Depression   . Hypertension     Past Surgical History  Procedure Laterality Date  . Cholecystectomy  2001  . Left shoulder surgery  2011  . Colonoscopy  11/09/2011    Procedure: COLONOSCOPY;  Surgeon: Dorothyann Peng, MD;  Location: [facility];  Service: Endoscopy;  Laterality: N/A;  12:30 PM  . Esophagogastroduodenoscopy  10/07/2012    Procedure: ESOPHAGOGASTRODUODENOSCOPY (EGD);  Surgeon: Jamesetta So, MD;  Location: [facility];  Service: Gastroenterology;  Laterality: N/A;  . Hernia repair  1956  . Cyst excision  1970's    back  . Inguinal hernia repair Right 11/04/2013    Procedure: RECURRENT RIGHT INGUINAL HERNIA REPAIR;   Surgeon: Jamesetta So, MD;  Location: [facility];  Service: General;  Laterality: Right;  . Lesion removal N/A 11/04/2013    Procedure: EXCISION OF SKIN LESION ABDOMINAL WALL;  Surgeon: Jamesetta So, MD;  Location: [facility];  Service: General;  Laterality: N/A;    Family History  Problem Relation Age of Onset  . Colon cancer Neg Hx   . Diabetes Neg Hx   . Hypertension Mother     Social History:  reports that he has never smoked. He does not have any smokeless tobacco history on file. He reports that he drinks about 3.0 oz of alcohol per week. He reports that he does not use illicit drugs.  Allergies:  Allergies  Allergen Reactions  . Red Dye Nausea And Vomiting  . Gadolinium Nausea And Vomiting     Code: VOM, with injection, no other reactions noted, Onset Date: [id number]       Medication List       This list is accurate as of: 02/20/16  9:39 PM.  Always use your most recent med list.               ALPRAZolam 0.5 MG tablet  Commonly known as:  XANAX  Take 0.5 mg by mouth at bedtime. For sleep.     aspirin EC 81 MG tablet  Take 81 mg by mouth daily. Reported on 02/20/2016  dexamethasone 1 MG tablet  Commonly known as:  DECADRON  1 tablet at bedtime prior to lab work     doxazosin 2 MG tablet  Commonly known as:  CARDURA  Take 1 tablet (2 mg total) by mouth at bedtime.     escitalopram 10 MG tablet  Commonly known as:  LEXAPRO  Take 10 mg by mouth daily. Reported on 02/20/2016     ibuprofen 200 MG tablet  Commonly known as:  ADVIL,MOTRIN  Take 200 mg by mouth as needed.     metoprolol tartrate 25 MG tablet  Commonly known as:  LOPRESSOR  Take 25 mg by mouth daily. Reported on 02/20/2016     multivitamin with minerals Tabs tablet  Take 1 tablet by mouth every morning.     traMADol 50 MG tablet  Commonly known as:  ULTRAM  Take 1 tablet (50 mg total) by mouth every 6 (six) hours as needed.        LABS:  No visits with results within 1 Week(s) from this  visit. Latest known visit with results is:  Admission on 04/18/2015, Discharged on 04/18/2015  Component Date Value Ref Range Status  . Sodium 04/18/2015 138  135 - 145 mmol/L Final  . Potassium 04/18/2015 4.2  3.5 - 5.1 mmol/L Final  . Chloride 04/18/2015 101  101 - 111 mmol/L Final  . CO2 04/18/2015 26  22 - 32 mmol/L Final  . Glucose, Bld 04/18/2015 113* 65 - 99 mg/dL Final  . BUN 04/18/2015 17  6 - 20 mg/dL Final  . Creatinine, Ser 04/18/2015 0.93  0.61 - 1.24 mg/dL Final  . Calcium 04/18/2015 9.2  8.9 - 10.3 mg/dL Final  . GFR calc non Af Amer 04/18/2015 >60  >60 mL/min Final  . GFR calc Af Amer 04/18/2015 >60  >60 mL/min Final   Comment: (NOTE) The eGFR has been calculated using the CKD EPI equation. This calculation has not been validated in all clinical situations. eGFR's persistently <60 mL/min signify possible Chronic Kidney Disease.   . Anion gap 04/18/2015 11  5 - 15 Final  . WBC 04/18/2015 9.1  4.0 - 10.5 K/uL Final  . RBC 04/18/2015 5.44  4.22 - 5.81 MIL/uL Final  . Hemoglobin 04/18/2015 17.4* 13.0 - 17.0 g/dL Final  . HCT 04/18/2015 50.7  39.0 - 52.0 % Final  . MCV 04/18/2015 93.2  78.0 - 100.0 fL Final  . MCH 04/18/2015 32.0  26.0 - 34.0 pg Final  . MCHC 04/18/2015 34.3  30.0 - 36.0 g/dL Final  . RDW 04/18/2015 12.9  11.5 - 15.5 % Final  . Platelets 04/18/2015 282  150 - 400 K/uL Final  . Neutrophils Relative % 04/18/2015 64  43 - 77 % Final  . Neutro Abs 04/18/2015 5.8  1.7 - 7.7 K/uL Final  . Lymphocytes Relative 04/18/2015 21  12 - 46 % Final  . Lymphs Abs 04/18/2015 2.0  0.7 - 4.0 K/uL Final  . Monocytes Relative 04/18/2015 12  3 - 12 % Final  . Monocytes Absolute 04/18/2015 1.1* 0.1 - 1.0 K/uL Final  . Eosinophils Relative 04/18/2015 2  0 - 5 % Final  . Eosinophils Absolute 04/18/2015 0.2  0.0 - 0.7 K/uL Final  . Basophils Relative 04/18/2015 1  0 - 1 % Final  . Basophils Absolute 04/18/2015 0.1  0.0 - 0.1 K/uL Final  . Troponin I 04/18/2015 <0.03  <0.031  ng/mL Final   Comment:        NO  INDICATION OF MYOCARDIAL INJURY.      REVIEW OF SYSTEMS:        Review of Systems  Constitutional: Positive for weight loss and reduced appetite.       7 lbs  HENT: Negative for headaches.   Eyes: Negative for blurred vision.  Respiratory: Negative for shortness of breath.   Cardiovascular: Negative for chest pain and palpitations.  Gastrointestinal: Positive for constipation. Negative for vomiting.  Endocrine: Negative for fatigue.  Genitourinary: Negative for slow stream.       Recently had a bladder tumor removed by urologist  Musculoskeletal: Negative for joint pain.  Skin:       No flushing He has had lifelong skin nodules, largest one on right lower abdomen was removed  Neurological: Negative for weakness.  Psychiatric/Behavioral: Negative for insomnia.     PHYSICAL EXAM:  BP 148/94 mmHg  Pulse 81  Temp(Src) 98.9 F (37.2 C)  Resp 16  Ht _0  (1.88 m)  Wt 159 lb (72.122 kg)  BMI 20.41 kg/m2  SpO2 96%  Repeat blood pressure 150/90, same on both arms  GENERAL:Averagely built and nourished  No pallor, clubbing, lymphadenopathy or edema.   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 He has a caf au lait spot on his left upper abdomen    EYES:  Externally normal.  Fundii:  normal discs and vessels.  ENT: Oral mucosa and tongue normal.  No lesions on his tongue or oral mucosa  THYROID:  Not palpable.  HEART:  Normal  S1 and S2; no murmur or click.  CHEST:  Normal shape.  Lungs: Vescicular breath sounds heard equally.  No crepitations/ wheeze.  ABDOMEN:  No distention.  Liver and spleen not palpable.  No other mass or tenderness.  NEUROLOGICAL: He has a mild fine tremor present..Reflexes are bilaterally normal at ankles.  JOINTS:  Normal.   ASSESSMENT:    Adrenal adenoma, 4 cm in size  of unclear etiology.  CT scan does not support the diagnoses of  pheochromocytoma.  However with the present of neurofibromatosis and a caf au lait spot this needs to be ruled out. His cortisol levels are upper normal but he has no clinical signs or symptoms of cortisol excess. Although he has hypertension he does not have any hypokalemia suggesting of primary hyperaldosteronism.  Potassium is high normal He has not had any suspected family member with thyroid or adrenal gland tumors suggestive of MEN syndrome   HYPERTENSION: Blood pressure is significantly high today and not clear how long he has had this. Considering he has neurofibromatosis he may have renal vascular hypertension but also potentially pheochromocytoma    Weight loss and decreased appetite: Etiology unclear and will need to be evaluated further by his PCP   PLAN:    24-hour urine metanephrines  Overnight dexamethasone suppression test  Renin/ aldosterone ratio  Empirically use doxazosin 2 mg at bedtime for hypertension until further evaluation done   Will obtain reports of his MRI done by urologist to see if it had any description of the adrenal tumor  Tarique Loveall 02/20/2016, 9:39 PM   Addendum: MRI showed the following  3.6 cm intermediate left adrenal mass increased minimally since 03/2010 Urine metanephrines and normetanephrines are both over 2000, will need MIBG scan  [facility]

## 2016-02-22 ENCOUNTER — Other Ambulatory Visit (INDEPENDENT_AMBULATORY_CARE_PROVIDER_SITE_OTHER): Payer: Federal, State, Local not specified - PPO

## 2016-02-22 DIAGNOSIS — D3502 Benign neoplasm of left adrenal gland: Secondary | ICD-10-CM | POA: Diagnosis not present

## 2016-02-22 LAB — CORTISOL: Cortisol, Plasma: 0.7 ug/dL

## 2016-02-24 LAB — METANEPHRINES, URINE, 24 HOUR
Metaneph Total, Ur: 1229 ug/L
Metanephrines, 24H Ur: 2212 ug/24 hr — ABNORMAL HIGH (ref 45–290)
NORMETANEPHRINE UR: 1207 ug/L
Normetanephrine, 24H Ur: 2173 ug/24 hr — ABNORMAL HIGH (ref 82–500)

## 2016-02-27 ENCOUNTER — Other Ambulatory Visit: Payer: Self-pay | Admitting: Endocrinology

## 2016-02-27 DIAGNOSIS — D3502 Benign neoplasm of left adrenal gland: Secondary | ICD-10-CM

## 2016-02-27 NOTE — Progress Notes (Signed)
Quick Note:  Please let patient know that the test for the adrenaline hormone is significantly high. Will need to schedule him for a nuclear scan to confirm that he has an adrenal tumor called Pheochromocytoma If he would like to come and discuss this please make appointment ______

## 2016-02-28 ENCOUNTER — Other Ambulatory Visit: Payer: Self-pay | Admitting: Endocrinology

## 2016-02-28 DIAGNOSIS — D3502 Benign neoplasm of left adrenal gland: Secondary | ICD-10-CM

## 2016-02-29 ENCOUNTER — Ambulatory Visit (INDEPENDENT_AMBULATORY_CARE_PROVIDER_SITE_OTHER): Payer: Federal, State, Local not specified - PPO | Admitting: Endocrinology

## 2016-02-29 ENCOUNTER — Other Ambulatory Visit: Payer: Self-pay | Admitting: Endocrinology

## 2016-02-29 ENCOUNTER — Encounter: Payer: Self-pay | Admitting: Endocrinology

## 2016-02-29 ENCOUNTER — Other Ambulatory Visit: Payer: Self-pay | Admitting: *Deleted

## 2016-02-29 VITALS — BP 122/76 | HR 81 | Temp 98.5°F | Resp 12 | Wt 160.8 lb

## 2016-02-29 DIAGNOSIS — D3502 Benign neoplasm of left adrenal gland: Secondary | ICD-10-CM

## 2016-02-29 NOTE — Progress Notes (Signed)
Patient ID: Chad Butler, male   DOB: 01-08-1951, 65 y.o.   MRN: [id number]           Chief complaint: Adrenal tumor on CT scan, follow-up visit  History of Present Illness:   The patient has been having problems with weight loss and decreased appetite since early April This started when he developed a significant cough which subsequently resolved He had has lost 7 pounds Because of his weight loss he was sent for a CT scan of his abdomen which showed a 3.4-4 cm left adrenal adenoma of indeterminate significance.  This showed attenuation of 74 on the delayed phase data.    He thinks he was given metoprolol 25 mg for hypertension previously but had not taken it for some time He denies any problems with headaches, flushing, sweating episodes, palpitations.  His labs included cortisol levels done on 01/03/16, he has 2 levels of 17.2 and 15.5 but not clear at what times these were drawn  RECENT history: He was started on doxazosin for his increased blood pressure 2 mg daily, he has taken this without side effects He has had the following labs done on initial consultation including urine metanephrines, dexamethasone suppression test and aldosterone/renin ratio  Lab on 02/22/2016  Component Date Value Ref Range Status  . Creatinine, Urine 02/22/2016 63.6  Not Estab. mg/dL Final  . Creatinine, 24H Ur 02/22/2016 1145  1000 - 2000 mg/24 hr Final  . Normetanephrine, Ur 02/22/2016 1207  Undefined ug/L Final  . Normetanephrine, 24H Ur 02/22/2016 2173* 82 - 500 ug/24 hr Final        (Hypertensive) >17 years 11 months:    110 - 1050  . Metaneph Total, Ur 02/22/2016 1229  Undefined ug/L Final  . Metanephrines, 24H Ur 02/22/2016 2212* 45 - 290 ug/24 hr Final        (Hypertensive) >17 years 11 months:     35 -  460  . ALDOSTERONE 02/22/2016 7.6  0.0 - 30.0 ng/dL Final   Comment: This test was developed and its performance characteristics determined by [facility]. It has not been cleared or approved by  the Food and Drug Administration.   Marland Kitchen Renin 02/22/2016 WILL FOLLOW   Preliminary  . ALDOS/RENIN RATIO 02/22/2016 WILL FOLLOW   Preliminary  . Cortisol, Plasma 02/22/2016 0.7   Final   AM:  4.3 - 22.4 ug/dLPM:  3.1 - 16.7 ug/dL       Past Medical History  Diagnosis Date  . Anxiety   . Hemorrhoids   . Depression   . Hypertension     Past Surgical History  Procedure Laterality Date  . Cholecystectomy  2001  . Left shoulder surgery  2011  . Colonoscopy  11/09/2011    Procedure: COLONOSCOPY;  Surgeon: Dorothyann Peng, MD;  [facility];  Service: Endoscopy;  Laterality: N/A;  12:30 PM  . Esophagogastroduodenoscopy  10/07/2012    Procedure: ESOPHAGOGASTRODUODENOSCOPY (EGD);  Surgeon: Jamesetta So, MD;  Location: [facility];  Service: Gastroenterology;  Laterality: N/A;  . Hernia repair  1956  . Cyst excision  1970's    back  . Inguinal hernia repair Right 11/04/2013    Procedure: RECURRENT RIGHT INGUINAL HERNIA REPAIR;  Surgeon: Jamesetta So, MD;  Location: [facility];  Service: General;  Laterality: Right;  . Lesion removal N/A 11/04/2013    Procedure: EXCISION OF SKIN LESION ABDOMINAL WALL;  Surgeon: Jamesetta So, MD;  Location: [facility];  Service: General;  Laterality:  N/A;    Family History  Problem Relation Age of Onset  . Colon cancer Neg Hx   . Diabetes Neg Hx   . Thyroid disease Neg Hx   . Hypertension Mother     Social History:  reports that he has never smoked. He does not have any smokeless tobacco history on file. He reports that he drinks about 3.0 oz of alcohol per week. He reports that he does not use illicit drugs.  Allergies:  Allergies  Allergen Reactions  . Red Dye Nausea And Vomiting  . Gadolinium Nausea And Vomiting     Code: VOM, with injection, no other reactions noted, Onset Date: [id number]       Medication List       This list is accurate as of: 02/29/16  5:03 PM.  Always use your most recent med list.               ALPRAZolam 0.5  MG tablet  Commonly known as:  XANAX  Take 0.5 mg by mouth at bedtime. For sleep.     aspirin EC 81 MG tablet  Take 81 mg by mouth daily. Reported on 02/29/2016     doxazosin 2 MG tablet  Commonly known as:  CARDURA  Take 1 tablet (2 mg total) by mouth at bedtime.     escitalopram 10 MG tablet  Commonly known as:  LEXAPRO  Take 10 mg by mouth daily. Reported on 02/29/2016     ibuprofen 200 MG tablet  Commonly known as:  ADVIL,MOTRIN  Take 200 mg by mouth as needed.     metoprolol tartrate 25 MG tablet  Commonly known as:  LOPRESSOR  Take 25 mg by mouth daily. Reported on 02/20/2016     multivitamin with minerals Tabs tablet  Take 1 tablet by mouth every morning.     traMADol 50 MG tablet  Commonly known as:  ULTRAM  Take 1 tablet (50 mg total) by mouth every 6 (six) hours as needed.        LABS:  No visits with results within 1 Week(s) from this visit. Latest known visit with results is:  Lab on 02/22/2016  Component Date Value Ref Range Status  . Creatinine, Urine 02/22/2016 63.6  Not Estab. mg/dL Final  . Creatinine, 24H Ur 02/22/2016 1145  1000 - 2000 mg/24 hr Final  . Normetanephrine, Ur 02/22/2016 1207  Undefined ug/L Final  . Normetanephrine, 24H Ur 02/22/2016 2173* 82 - 500 ug/24 hr Final        (Hypertensive) >17 years 11 months:    110 - 1050  . Metaneph Total, Ur 02/22/2016 1229  Undefined ug/L Final  . Metanephrines, 24H Ur 02/22/2016 2212* 45 - 290 ug/24 hr Final        (Hypertensive) >17 years 11 months:     35 -  460  . ALDOSTERONE 02/22/2016 7.6  0.0 - 30.0 ng/dL Final   Comment: This test was developed and its performance characteristics determined by [facility]. It has not been cleared or approved by the Food and Drug Administration.   Marland Kitchen Renin 02/22/2016 WILL FOLLOW   Preliminary  . ALDOS/RENIN RATIO 02/22/2016 WILL FOLLOW   Preliminary  . Cortisol, Plasma 02/22/2016 0.7   Final   AM:  4.3 - 22.4 ug/dLPM:  3.1 - 16.7 ug/dL     REVIEW OF SYSTEMS:         Review of Systems  Constitutional:       He thinks his appetite is  slowly improving   Wt Readings from Last 3 Encounters:  02/29/16 160 lb 12.8 oz (72.938 kg)  02/20/16 159 lb (72.122 kg)  04/18/15 165 lb (74.844 kg)    PHYSICAL EXAM:  BP 122/76 mmHg  Pulse 81  Temp(Src) 98.5 F (36.9 C) (Oral)  Resp 12  Wt 160 lb 12.8 oz (72.938 kg)  SpO2 98%    ASSESSMENT:    Adrenal adenoma, 4 cm in size.  Now appears to have markedly increased urine metanephrines and normetanephrines .  He likely has pheochromocytoma and this may be associated with his neurofibromatosis   Normal aldosterone and cortisol as judged by Dexamethasone suppression   HYPERTENSION: Blood pressure is improved with doxazosin, may still be related to his pheochromocytoma    Weight loss and decreased appetite: Etiology unclear and he thinks his appetite is slowly better and weight is stable   PLAN:    MIBG scan and consider adrenalectomy if positive.  He is not clear where he wants to get a surgery done  [facility] 02/29/2016, 5:03 PM

## 2016-03-01 ENCOUNTER — Ambulatory Visit: Payer: Federal, State, Local not specified - PPO | Admitting: Gastroenterology

## 2016-03-01 LAB — CREATININE, URINE, 24 HOUR
CREATININE, UR: 63.6 mg/dL
Creatinine, 24H Ur: 1145 mg/24 hr (ref 1000–2000)

## 2016-03-01 LAB — ALDOSTERONE + RENIN ACTIVITY W/ RATIO
ALDOS/RENIN RATIO: 3.3 (ref 0.0–30.0)
ALDOSTERONE: 7.6 ng/dL (ref 0.0–30.0)
Renin: 2.314 ng/mL/hr (ref 0.167–5.380)

## 2016-03-02 ENCOUNTER — Other Ambulatory Visit: Payer: Self-pay | Admitting: *Deleted

## 2016-03-02 ENCOUNTER — Telehealth: Payer: Self-pay | Admitting: Endocrinology

## 2016-03-02 MED ORDER — IODINE STRONG (LUGOLS) 5 % PO SOLN: ORAL | Status: DC

## 2016-03-02 NOTE — Telephone Encounter (Signed)
It is the solution,  I  will send in the medication.

## 2016-03-02 NOTE — Telephone Encounter (Signed)
FYI  Please see below

## 2016-03-02 NOTE — Telephone Encounter (Signed)
Shannon from [facility] # [id number] calling to let Suanne Marker know the # for [facility] is [phone] for the solution needed. It is 0.2 cc liquid water juice 1 x daily the day before the study, the day of the study, and 2 wks after the study  Please call her with any questions

## 2016-03-02 NOTE — Telephone Encounter (Signed)
I think she must be referring to the Lugol's solution, please clarify and prescribe as indicated

## 2016-03-06 ENCOUNTER — Telehealth: Payer: Self-pay | Admitting: Endocrinology

## 2016-03-06 DIAGNOSIS — F419 Anxiety disorder, unspecified: Secondary | ICD-10-CM | POA: Diagnosis not present

## 2016-03-06 DIAGNOSIS — G4733 Obstructive sleep apnea (adult) (pediatric): Secondary | ICD-10-CM | POA: Diagnosis not present

## 2016-03-06 DIAGNOSIS — Z91041 Radiographic dye allergy status: Secondary | ICD-10-CM | POA: Diagnosis not present

## 2016-03-06 DIAGNOSIS — Z8249 Family history of ischemic heart disease and other diseases of the circulatory system: Secondary | ICD-10-CM | POA: Diagnosis not present

## 2016-03-06 DIAGNOSIS — C672 Malignant neoplasm of lateral wall of bladder: Secondary | ICD-10-CM | POA: Diagnosis not present

## 2016-03-06 DIAGNOSIS — G4700 Insomnia, unspecified: Secondary | ICD-10-CM | POA: Diagnosis not present

## 2016-03-06 DIAGNOSIS — Z9049 Acquired absence of other specified parts of digestive tract: Secondary | ICD-10-CM | POA: Diagnosis not present

## 2016-03-06 DIAGNOSIS — Z79899 Other long term (current) drug therapy: Secondary | ICD-10-CM | POA: Diagnosis not present

## 2016-03-06 DIAGNOSIS — Z809 Family history of malignant neoplasm, unspecified: Secondary | ICD-10-CM | POA: Diagnosis not present

## 2016-03-06 NOTE — Telephone Encounter (Signed)
If there is something I need to do?

## 2016-03-06 NOTE — Telephone Encounter (Signed)
No, it was just an FYI:

## 2016-03-06 NOTE — Telephone Encounter (Signed)
[facility] is not able to receive the radioactive tracer until the 13th so he will be rescheduled until the 13th at 1 pm  He needs to start a laxative on Monday in the PM and start the solution on Monday  [place] [phone]

## 2016-03-06 NOTE — Telephone Encounter (Signed)
Please see below.

## 2016-03-07 DIAGNOSIS — C672 Malignant neoplasm of lateral wall of bladder: Secondary | ICD-10-CM | POA: Diagnosis not present

## 2016-03-07 DIAGNOSIS — F419 Anxiety disorder, unspecified: Secondary | ICD-10-CM | POA: Diagnosis not present

## 2016-03-07 DIAGNOSIS — Z91041 Radiographic dye allergy status: Secondary | ICD-10-CM | POA: Diagnosis not present

## 2016-03-07 DIAGNOSIS — Z9049 Acquired absence of other specified parts of digestive tract: Secondary | ICD-10-CM | POA: Diagnosis not present

## 2016-03-07 DIAGNOSIS — C679 Malignant neoplasm of bladder, unspecified: Secondary | ICD-10-CM | POA: Diagnosis not present

## 2016-03-07 DIAGNOSIS — Z79899 Other long term (current) drug therapy: Secondary | ICD-10-CM | POA: Diagnosis not present

## 2016-03-07 DIAGNOSIS — C649 Malignant neoplasm of unspecified kidney, except renal pelvis: Secondary | ICD-10-CM | POA: Diagnosis not present

## 2016-03-07 DIAGNOSIS — G4700 Insomnia, unspecified: Secondary | ICD-10-CM | POA: Diagnosis not present

## 2016-03-12 ENCOUNTER — Encounter (HOSPITAL_COMMUNITY): Payer: Federal, State, Local not specified - PPO

## 2016-03-13 ENCOUNTER — Encounter (HOSPITAL_COMMUNITY): Payer: Self-pay

## 2016-03-13 ENCOUNTER — Encounter (HOSPITAL_COMMUNITY): Payer: Federal, State, Local not specified - PPO

## 2016-03-13 ENCOUNTER — Encounter (HOSPITAL_COMMUNITY)
Admission: RE | Admit: 2016-03-13 | Discharge: 2016-03-13 | Disposition: A | Discharge status: Home / Self Care | Payer: Federal, State, Local not specified - PPO | Source: Ambulatory Visit | Attending: Endocrinology | Admitting: Endocrinology

## 2016-03-13 DIAGNOSIS — D3502 Benign neoplasm of left adrenal gland: Secondary | ICD-10-CM | POA: Insufficient documentation

## 2016-03-13 DIAGNOSIS — I10 Essential (primary) hypertension: Secondary | ICD-10-CM | POA: Insufficient documentation

## 2016-03-13 HISTORY — DX: Malignant (primary) neoplasm, unspecified: C80.1

## 2016-03-13 MED ORDER — IOBENGUANE SULFATE I 123 10 MCI/5ML IV SOLN
10.0000 | Freq: Once | INTRAVENOUS | Status: AC | PRN
  Administered 2016-03-13: 10 via INTRAVENOUS

## 2016-03-14 ENCOUNTER — Encounter (HOSPITAL_COMMUNITY)
Admission: RE | Admit: 2016-03-14 | Discharge: 2016-03-14 | Disposition: A | Discharge status: Home / Self Care | Payer: Federal, State, Local not specified - PPO | Source: Ambulatory Visit | Attending: Endocrinology | Admitting: Endocrinology

## 2016-03-14 ENCOUNTER — Encounter (HOSPITAL_COMMUNITY): Payer: Federal, State, Local not specified - PPO

## 2016-03-14 DIAGNOSIS — I10 Essential (primary) hypertension: Secondary | ICD-10-CM | POA: Diagnosis not present

## 2016-03-14 DIAGNOSIS — D3502 Benign neoplasm of left adrenal gland: Secondary | ICD-10-CM | POA: Diagnosis not present

## 2016-03-14 DIAGNOSIS — R948 Abnormal results of function studies of other organs and systems: Secondary | ICD-10-CM | POA: Diagnosis not present

## 2016-03-15 ENCOUNTER — Encounter (HOSPITAL_COMMUNITY): Payer: Federal, State, Local not specified - PPO

## 2016-03-16 ENCOUNTER — Telehealth: Payer: Self-pay | Admitting: Endocrinology

## 2016-03-16 ENCOUNTER — Other Ambulatory Visit: Payer: Self-pay | Admitting: Endocrinology

## 2016-03-16 DIAGNOSIS — D3502 Benign neoplasm of left adrenal gland: Secondary | ICD-10-CM

## 2016-03-16 NOTE — Telephone Encounter (Signed)
I contacted the pt and advised of Md's note below and he voiced understanding. Pt is going to call [place] back to schedule appointment.

## 2016-03-16 NOTE — Telephone Encounter (Signed)
I had told him that this is a special surgery for the adrenal glands and do not know if Dr. Exie Parody has any experience

## 2016-03-16 NOTE — Telephone Encounter (Signed)
PT requests call back from the nurse, did not specify why

## 2016-03-16 NOTE — Telephone Encounter (Signed)
The referral is going to be done by our referral Department since they need records

## 2016-03-16 NOTE — Telephone Encounter (Signed)
Referral has been completed on our side. He was contacted by our [facility] about the appt. Pt was advised by our coordinators once he confirmed the appt with [place] to contact the urology office directly.

## 2016-03-16 NOTE — Telephone Encounter (Signed)
Pt called about the urology referral that was placed on 03/16/2016. Pt stated he is currently seeing a urologist in [place]) and wanted to confirm why the referral was placed. Please advise, Thanks!

## 2016-03-20 ENCOUNTER — Other Ambulatory Visit: Payer: Self-pay | Admitting: Endocrinology

## 2016-04-09 DIAGNOSIS — D3502 Benign neoplasm of left adrenal gland: Secondary | ICD-10-CM | POA: Diagnosis not present

## 2016-04-09 DIAGNOSIS — N3289 Other specified disorders of bladder: Secondary | ICD-10-CM | POA: Diagnosis not present

## 2016-04-17 DIAGNOSIS — D3500 Benign neoplasm of unspecified adrenal gland: Secondary | ICD-10-CM | POA: Diagnosis not present

## 2016-04-17 DIAGNOSIS — Q8501 Neurofibromatosis, type 1: Secondary | ICD-10-CM | POA: Diagnosis not present

## 2016-04-17 DIAGNOSIS — Z6821 Body mass index (BMI) 21.0-21.9, adult: Secondary | ICD-10-CM | POA: Diagnosis not present

## 2016-04-17 DIAGNOSIS — C678 Malignant neoplasm of overlapping sites of bladder: Secondary | ICD-10-CM | POA: Diagnosis not present

## 2016-04-17 DIAGNOSIS — Z1389 Encounter for screening for other disorder: Secondary | ICD-10-CM | POA: Diagnosis not present

## 2016-04-23 DIAGNOSIS — N3010 Interstitial cystitis (chronic) without hematuria: Secondary | ICD-10-CM | POA: Diagnosis not present

## 2016-04-23 DIAGNOSIS — C679 Malignant neoplasm of bladder, unspecified: Secondary | ICD-10-CM | POA: Diagnosis not present

## 2016-04-24 ENCOUNTER — Telehealth: Payer: Self-pay

## 2016-04-24 ENCOUNTER — Telehealth: Payer: Self-pay | Admitting: Endocrinology

## 2016-04-24 NOTE — Telephone Encounter (Signed)
What strength of the Doxazosin would you like?

## 2016-04-24 NOTE — Telephone Encounter (Signed)
2 mg daily as before, please asking when he is having adrenal gland surgery and if he needs help scheduling this

## 2016-04-24 NOTE — Telephone Encounter (Signed)
PT would like to know if Dr. Dwyane Dee can prescribe him more blood pressure medication?

## 2016-04-24 NOTE — Telephone Encounter (Signed)
He can refill doxazosin for 30 days but need to know when he is having his surgery for the adrenal gland

## 2016-04-25 ENCOUNTER — Other Ambulatory Visit: Payer: Self-pay

## 2016-04-25 MED ORDER — DOXAZOSIN MESYLATE 2 MG PO TABS: 2.0000 mg | ORAL_TABLET | Freq: Every day | ORAL | 0 refills | Status: DC

## 2016-04-25 MED ORDER — DOXAZOSIN MESYLATE 2 MG PO TABS: ORAL_TABLET | ORAL | 1 refills | Status: DC

## 2016-04-25 NOTE — Telephone Encounter (Signed)
Doxazosin sent into pharmacy. Called and left message for patient to return call regarding the adrenal gland surgery.

## 2016-04-25 NOTE — Telephone Encounter (Signed)
August 3 is the date of his appt to get the date for his surgery, he will let us know after that

## 2016-05-03 DIAGNOSIS — R8299 Other abnormal findings in urine: Secondary | ICD-10-CM | POA: Diagnosis not present

## 2016-05-03 DIAGNOSIS — Z888 Allergy status to other drugs, medicaments and biological substances status: Secondary | ICD-10-CM | POA: Diagnosis not present

## 2016-05-03 DIAGNOSIS — N3000 Acute cystitis without hematuria: Secondary | ICD-10-CM | POA: Diagnosis not present

## 2016-05-03 DIAGNOSIS — D3502 Benign neoplasm of left adrenal gland: Secondary | ICD-10-CM | POA: Diagnosis not present

## 2016-05-03 DIAGNOSIS — C679 Malignant neoplasm of bladder, unspecified: Secondary | ICD-10-CM | POA: Diagnosis not present

## 2016-05-03 DIAGNOSIS — I10 Essential (primary) hypertension: Secondary | ICD-10-CM | POA: Diagnosis not present

## 2016-05-03 DIAGNOSIS — D3610 Benign neoplasm of peripheral nerves and autonomic nervous system, unspecified: Secondary | ICD-10-CM | POA: Diagnosis not present

## 2016-05-03 DIAGNOSIS — N3289 Other specified disorders of bladder: Secondary | ICD-10-CM | POA: Diagnosis not present

## 2016-05-03 DIAGNOSIS — Z7982 Long term (current) use of aspirin: Secondary | ICD-10-CM | POA: Diagnosis not present

## 2016-05-03 DIAGNOSIS — C688 Malignant neoplasm of overlapping sites of urinary organs: Secondary | ICD-10-CM | POA: Diagnosis not present

## 2016-05-07 DIAGNOSIS — C688 Malignant neoplasm of overlapping sites of urinary organs: Secondary | ICD-10-CM | POA: Diagnosis not present

## 2016-05-07 DIAGNOSIS — R8299 Other abnormal findings in urine: Secondary | ICD-10-CM | POA: Diagnosis not present

## 2016-05-07 DIAGNOSIS — R918 Other nonspecific abnormal finding of lung field: Secondary | ICD-10-CM | POA: Diagnosis not present

## 2016-05-07 DIAGNOSIS — D3502 Benign neoplasm of left adrenal gland: Secondary | ICD-10-CM | POA: Diagnosis not present

## 2016-05-07 DIAGNOSIS — C7A8 Other malignant neuroendocrine tumors: Secondary | ICD-10-CM | POA: Diagnosis not present

## 2016-06-29 DIAGNOSIS — D3500 Benign neoplasm of unspecified adrenal gland: Secondary | ICD-10-CM | POA: Diagnosis not present

## 2016-06-29 DIAGNOSIS — Z6820 Body mass index (BMI) 20.0-20.9, adult: Secondary | ICD-10-CM | POA: Diagnosis not present

## 2016-06-29 DIAGNOSIS — Z1389 Encounter for screening for other disorder: Secondary | ICD-10-CM | POA: Diagnosis not present

## 2016-07-31 DIAGNOSIS — C672 Malignant neoplasm of lateral wall of bladder: Secondary | ICD-10-CM | POA: Diagnosis not present

## 2016-07-31 DIAGNOSIS — N3000 Acute cystitis without hematuria: Secondary | ICD-10-CM | POA: Diagnosis not present

## 2016-08-30 DIAGNOSIS — Z1389 Encounter for screening for other disorder: Secondary | ICD-10-CM | POA: Diagnosis not present

## 2016-08-30 DIAGNOSIS — C7A8 Other malignant neuroendocrine tumors: Secondary | ICD-10-CM | POA: Diagnosis not present

## 2016-08-30 DIAGNOSIS — C678 Malignant neoplasm of overlapping sites of bladder: Secondary | ICD-10-CM | POA: Diagnosis not present

## 2016-08-30 DIAGNOSIS — Z6821 Body mass index (BMI) 21.0-21.9, adult: Secondary | ICD-10-CM | POA: Diagnosis not present

## 2016-08-30 DIAGNOSIS — R1031 Right lower quadrant pain: Secondary | ICD-10-CM | POA: Diagnosis not present

## 2016-08-30 DIAGNOSIS — Q8501 Neurofibromatosis, type 1: Secondary | ICD-10-CM | POA: Diagnosis not present

## 2016-08-30 DIAGNOSIS — R3129 Other microscopic hematuria: Secondary | ICD-10-CM | POA: Diagnosis not present

## 2016-08-31 ENCOUNTER — Other Ambulatory Visit: Payer: Self-pay | Admitting: Internal Medicine

## 2016-08-31 DIAGNOSIS — R109 Unspecified abdominal pain: Secondary | ICD-10-CM

## 2016-09-13 ENCOUNTER — Ambulatory Visit (HOSPITAL_COMMUNITY)
Admission: RE | Admit: 2016-09-13 | Discharge: 2016-09-13 | Disposition: A | Discharge status: Home / Self Care | Payer: Federal, State, Local not specified - PPO | Source: Ambulatory Visit | Attending: Internal Medicine | Admitting: Internal Medicine

## 2016-09-13 DIAGNOSIS — K753 Granulomatous hepatitis, not elsewhere classified: Secondary | ICD-10-CM | POA: Diagnosis not present

## 2016-09-13 DIAGNOSIS — R109 Unspecified abdominal pain: Secondary | ICD-10-CM | POA: Diagnosis present

## 2016-09-13 DIAGNOSIS — E279 Disorder of adrenal gland, unspecified: Secondary | ICD-10-CM | POA: Insufficient documentation

## 2016-09-13 DIAGNOSIS — N400 Enlarged prostate without lower urinary tract symptoms: Secondary | ICD-10-CM | POA: Diagnosis not present

## 2016-09-13 DIAGNOSIS — I700 Atherosclerosis of aorta: Secondary | ICD-10-CM | POA: Diagnosis not present

## 2016-09-13 DIAGNOSIS — D3502 Benign neoplasm of left adrenal gland: Secondary | ICD-10-CM | POA: Insufficient documentation

## 2016-09-13 DIAGNOSIS — D71 Functional disorders of polymorphonuclear neutrophils: Secondary | ICD-10-CM | POA: Diagnosis not present

## 2016-09-13 LAB — POCT I-STAT CREATININE: Creatinine, Ser: 0.9 mg/dL (ref 0.61–1.24)

## 2016-09-13 MED ORDER — IOPAMIDOL (ISOVUE-300) INJECTION 61%
100.0000 mL | Freq: Once | INTRAVENOUS | Status: AC | PRN
  Administered 2016-09-13: 100 mL via INTRAVENOUS

## 2016-10-30 DIAGNOSIS — C672 Malignant neoplasm of lateral wall of bladder: Secondary | ICD-10-CM | POA: Diagnosis not present

## 2016-10-30 DIAGNOSIS — R39198 Other difficulties with micturition: Secondary | ICD-10-CM | POA: Diagnosis not present

## 2016-10-30 DIAGNOSIS — E279 Disorder of adrenal gland, unspecified: Secondary | ICD-10-CM | POA: Diagnosis not present

## 2016-10-30 DIAGNOSIS — Z126 Encounter for screening for malignant neoplasm of bladder: Secondary | ICD-10-CM | POA: Diagnosis not present

## 2016-12-11 ENCOUNTER — Ambulatory Visit: Payer: Federal, State, Local not specified - PPO | Admitting: Urology

## 2016-12-17 DIAGNOSIS — G4733 Obstructive sleep apnea (adult) (pediatric): Secondary | ICD-10-CM | POA: Diagnosis not present

## 2016-12-17 DIAGNOSIS — Q8501 Neurofibromatosis, type 1: Secondary | ICD-10-CM | POA: Diagnosis not present

## 2016-12-17 DIAGNOSIS — Z6821 Body mass index (BMI) 21.0-21.9, adult: Secondary | ICD-10-CM | POA: Diagnosis not present

## 2016-12-17 DIAGNOSIS — Z1389 Encounter for screening for other disorder: Secondary | ICD-10-CM | POA: Diagnosis not present

## 2016-12-25 IMAGING — MR MR SHOULDER*L* W/O CM
4 of 6 series · 19 of 40 positions shown · non-contrast
Comparison: 10/12/2014

CLINICAL DATA: Left shoulder pain for 1 month. Prior left shoulder
surgery in 3199.

EXAM:
MRI OF THE LEFT SHOULDER WITHOUT CONTRAST
TECHNIQUE: Multiplanar, multisequence MR imaging of the shoulder was performed.
No intravenous contrast was administered.

[Series 3: t2fs axial · axial · 3.0mm · 0.31mm/px · z∈[-27,+41]mm · 3 of 24 slices shown]
[im 5/24]
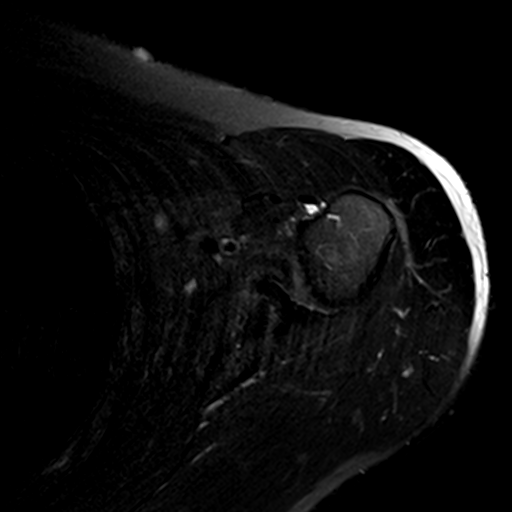
[im 14/24]
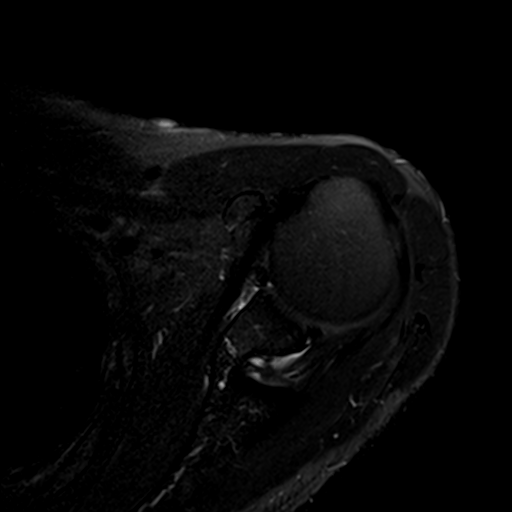
[im 24/24]
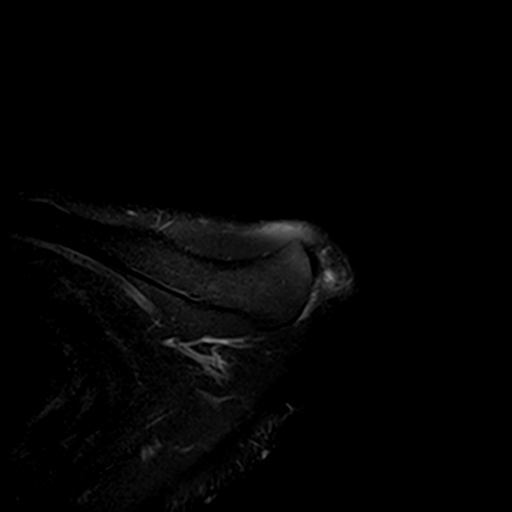

[Series 4: t2fs coronal · sagittal · 3.0mm · 0.31mm/px · 3 of 20 slices shown]
[im 4/20]
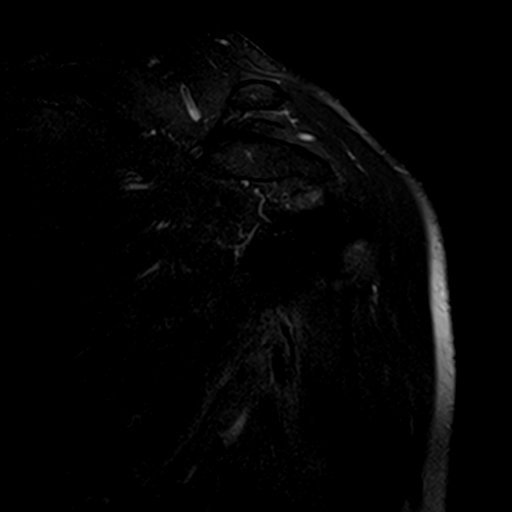
[im 12/20]
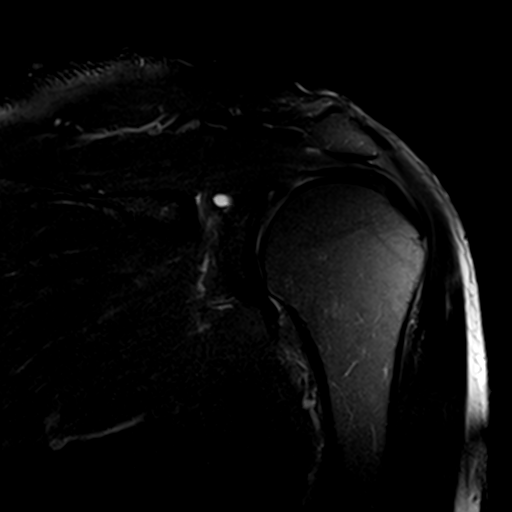
[im 20/20]
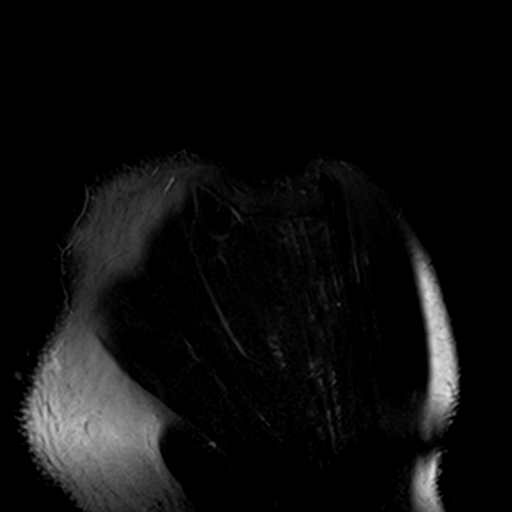

[Series 5: PD · sagittal · 3.0mm · 0.31mm/px · 6 of 20 slices shown]
[im 1/20]
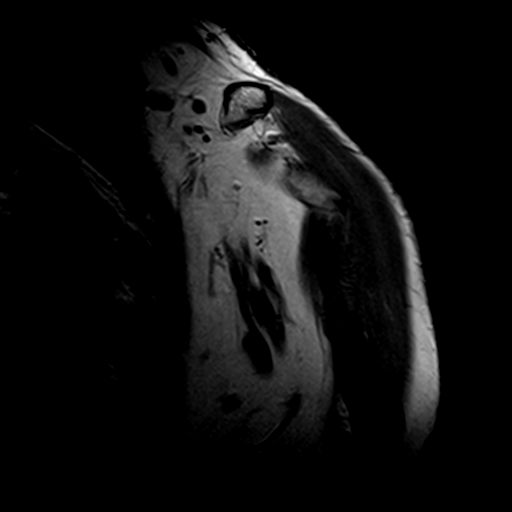
[im 4/20]
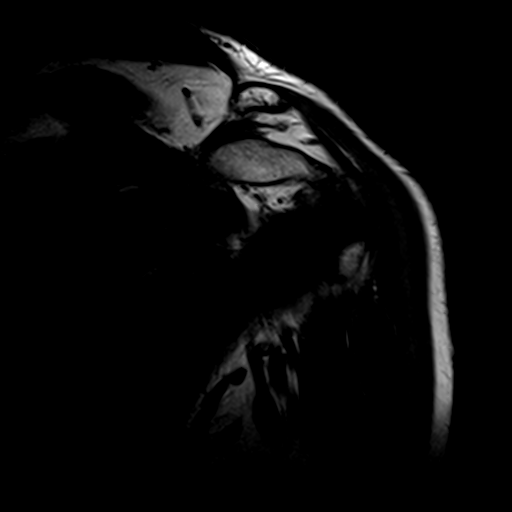
[im 8/20]
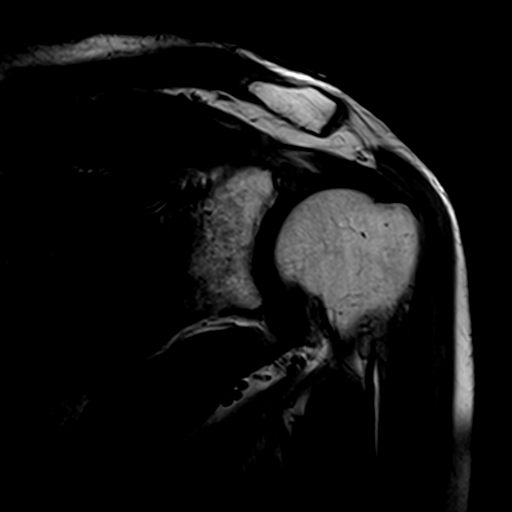
[im 12/20]
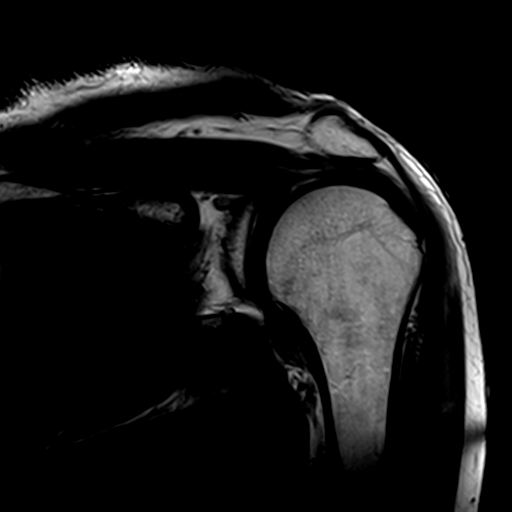
[im 16/20]
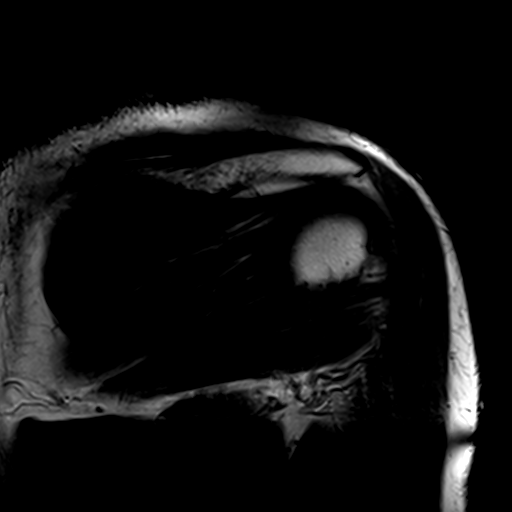
[im 20/20]
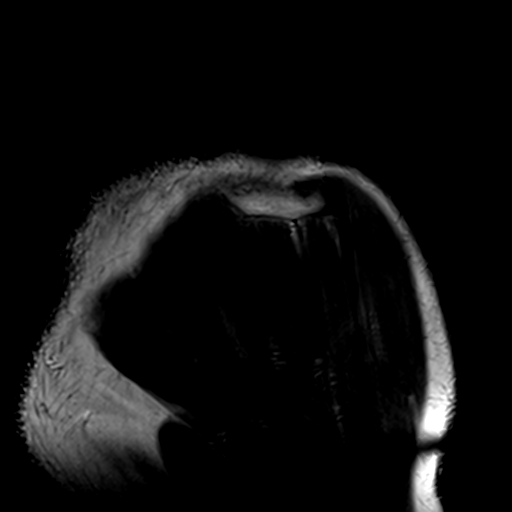

[Series 6: T1 · coronal · 3.0mm · 0.31mm/px · 7 of 30 slices shown]
[im 1/30]
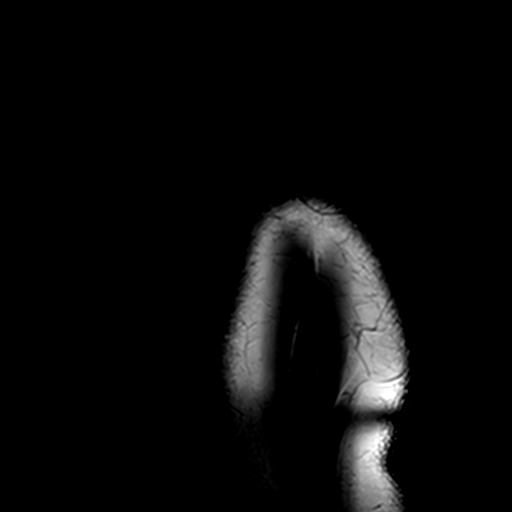
[im 5/30]
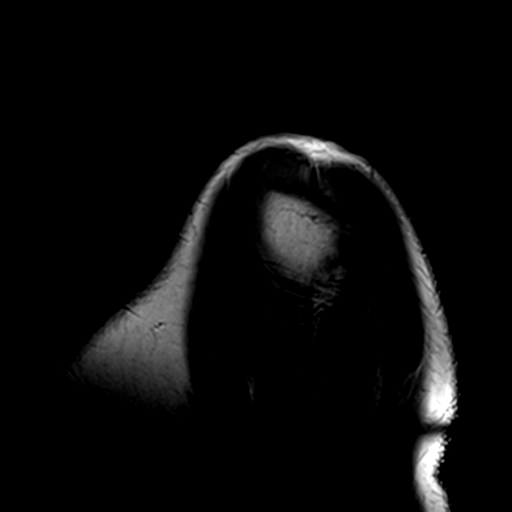
[im 9/30]
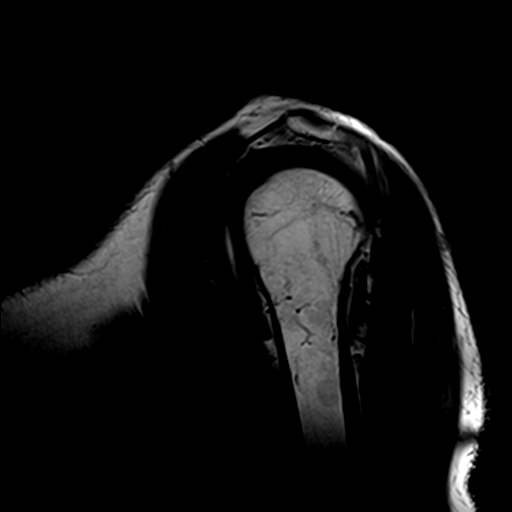
[im 13/30]
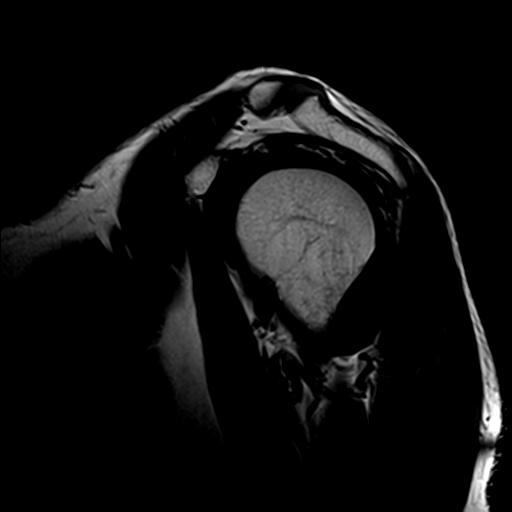
[im 17/30]
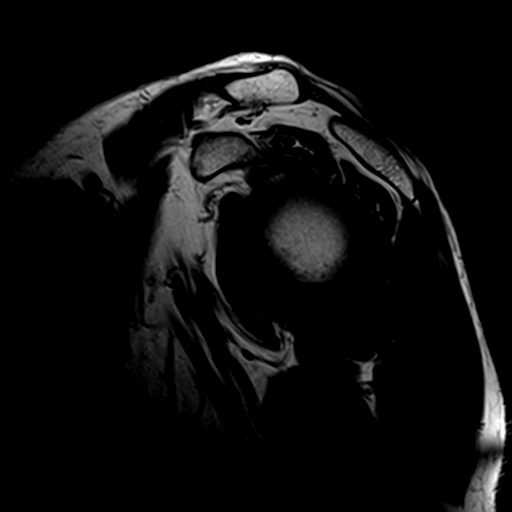
[im 21/30]
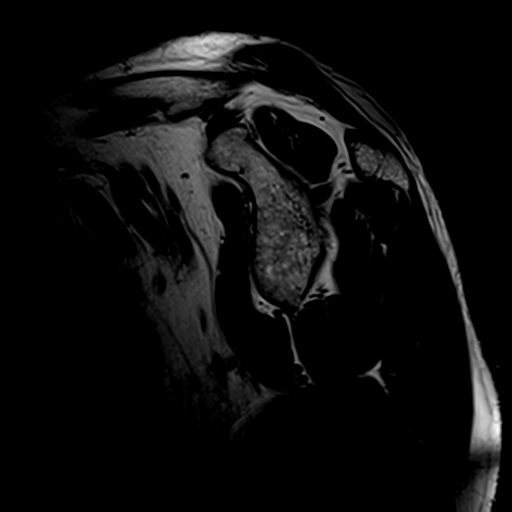
[im 25/30]
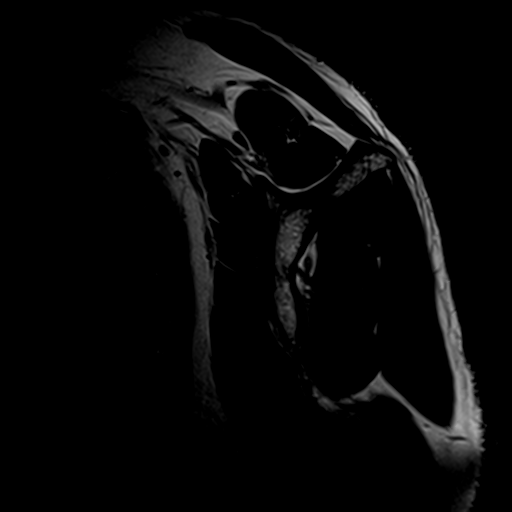

[19 of 40 positions shown; findings below may reference images not displayed]

FINDINGS: Despite efforts by the technologist and patient, motion artifact is
present on today's exam and could not be eliminated. This reduces
exam sensitivity and specificity.

Rotator cuff:  Unremarkable

Muscles:  Unremarkable

Biceps long head:  Unremarkable

Acromioclavicular Joint: Unremarkable. Mildly laterally downsloping
acromion.

Glenohumeral Joint: Chondral thinning along the humeral head and
potentially along portions of the glenoid. Mild thickening and
indistinctness of the inferior glenohumeral ligament, images 10
through 14 of series 4.

Labrum: Linear increased signal in the superior labrum, images 8
through 12 of series 4, suspicious for a nondisplaced SLAP tear.

Bones:  Unremarkable
IMPRESSION: 1. Abnormal irregular linear signal in the superior labrum
suspicious for nondisplaced SLAP tear. This extends to the biceps
anchor.
2. Chondral thinning in the humeral head and potentially along
portions of the glenoid.
3. Indistinct thickening of the inferior glenohumeral ligament. This
could be due to prior injury or mild localized synovitis.
4. Laterally downsloping acromion could predispose to impingement.

## 2017-01-15 ENCOUNTER — Ambulatory Visit (INDEPENDENT_AMBULATORY_CARE_PROVIDER_SITE_OTHER): Payer: Federal, State, Local not specified - PPO | Admitting: Urology

## 2017-01-15 DIAGNOSIS — C678 Malignant neoplasm of overlapping sites of bladder: Secondary | ICD-10-CM

## 2017-01-15 DIAGNOSIS — N201 Calculus of ureter: Secondary | ICD-10-CM

## 2017-02-11 DIAGNOSIS — R112 Nausea with vomiting, unspecified: Secondary | ICD-10-CM | POA: Diagnosis not present

## 2017-02-11 DIAGNOSIS — Z6822 Body mass index (BMI) 22.0-22.9, adult: Secondary | ICD-10-CM | POA: Diagnosis not present

## 2017-02-11 DIAGNOSIS — Z1389 Encounter for screening for other disorder: Secondary | ICD-10-CM | POA: Diagnosis not present

## 2017-02-22 DIAGNOSIS — C678 Malignant neoplasm of overlapping sites of bladder: Secondary | ICD-10-CM | POA: Diagnosis not present

## 2017-02-22 DIAGNOSIS — Z6822 Body mass index (BMI) 22.0-22.9, adult: Secondary | ICD-10-CM | POA: Diagnosis not present

## 2017-02-22 DIAGNOSIS — R112 Nausea with vomiting, unspecified: Secondary | ICD-10-CM | POA: Diagnosis not present

## 2017-02-22 DIAGNOSIS — N401 Enlarged prostate with lower urinary tract symptoms: Secondary | ICD-10-CM | POA: Diagnosis not present

## 2017-02-26 ENCOUNTER — Encounter: Payer: Self-pay | Admitting: Gastroenterology

## 2017-03-08 ENCOUNTER — Ambulatory Visit: Payer: Federal, State, Local not specified - PPO | Admitting: Nurse Practitioner

## 2017-03-08 ENCOUNTER — Ambulatory Visit (INDEPENDENT_AMBULATORY_CARE_PROVIDER_SITE_OTHER): Payer: Federal, State, Local not specified - PPO | Admitting: Gastroenterology

## 2017-03-08 ENCOUNTER — Encounter: Payer: Self-pay | Admitting: Gastroenterology

## 2017-03-08 DIAGNOSIS — R1110 Vomiting, unspecified: Secondary | ICD-10-CM

## 2017-03-08 MED ORDER — PANTOPRAZOLE SODIUM 40 MG PO TBEC: 40.0000 mg | DELAYED_RELEASE_TABLET | Freq: Every day | ORAL | 3 refills | Status: DC

## 2017-03-08 NOTE — Patient Instructions (Signed)
Start taking Protonix once each morning, 30 minutes before breakfast.   Call me in 1-2 weeks with a progress report. If you are not better, we will do an upper endoscopy.

## 2017-03-08 NOTE — Assessment & Plan Note (Signed)
66 year old male with intermittent vomiting after breakfast for several months; however, he notes no issues with remaining meals. No dysphagia, abdominal pain, lack of appetite, weight loss. EGD on file from 2014 and normal. Currently not on a PPI but takes Tums and Rolaids prn. Will start on a PPI in case this is atypical reflux symptoms. If no improvement, proceed with EGD. Patient to call with update. Will have him return in 3 months regardless.

## 2017-03-08 NOTE — Progress Notes (Signed)
Primary Care Physician:  Redmond School, MD Primary Gastroenterologist:  Dr. Oneida Alar   Chief Complaint  Patient presents with  . Emesis    usually in mornings after eating; since April  . Abdominal Pain    rlq    HPI:   Chad Butler is a 66 y.o. male presenting today at the request of his PCP secondary to vomiting. Notes he vomits after breakfast but is fine lunch and dinner. Zofran not helping. No abdominal pain. No typical reflux. Will occasionally take a Tums or Rolaids. Not on a PPI. No unexplained recent weight loss. Symptoms present for about 2 months. No early satiety. Doesn't feel nauseated but feels like "I'm going to spit up phlegm".   Sometimes will have pain in RLQ, states "where my hernia is located". Just nagging but not severe. No exacerbating factors.   Last colonoscopy in 2013 and due again in 2023. EGD in 2014 normal by Dr. Arnoldo Morale.   Past Medical History:  Diagnosis Date  . Anxiety   . Bladder cancer ([place])   . Cancer ([place])   . Depression   . Hemorrhoids   . Hypertension     Past Surgical History:  Procedure Laterality Date  . CHOLECYSTECTOMY  2001  . COLONOSCOPY  11/09/2011   Dr. Oneida Alar: two tubular adenomas, mild diverticulosis, internal hemorrhoids, surveillance in 10 years   . CYST EXCISION  1970's   back  . ESOPHAGOGASTRODUODENOSCOPY  10/07/2012   Dr. Arnoldo Morale: normal, Clotest negative  . HERNIA REPAIR  1956  . INGUINAL HERNIA REPAIR Right 11/04/2013   Procedure: RECURRENT RIGHT INGUINAL HERNIA REPAIR;  Surgeon: Jamesetta So, MD;  Location: [facility];  Service: General;  Laterality: Right;  . Left shoulder surgery  2011  . LESION REMOVAL N/A 11/04/2013   Procedure: EXCISION OF SKIN LESION ABDOMINAL WALL;  Surgeon: Jamesetta So, MD;  Location: [facility];  Service: General;  Laterality: N/A;    Current Outpatient Prescriptions  Medication Sig Dispense Refill  . ALPRAZolam (XANAX) 0.5 MG tablet Take 0.5 mg by mouth at bedtime. For sleep.    Marland Kitchen  aspirin EC 81 MG tablet Take 81 mg by mouth daily. Reported on 02/29/2016    . ibuprofen (ADVIL,MOTRIN) 200 MG tablet Take 200 mg by mouth as needed.    . Multiple Vitamin (MULITIVITAMIN WITH MINERALS) TABS Take 1 tablet by mouth every morning.     . tamsulosin (FLOMAX) 0.4 MG CAPS capsule Take 1 capsule by mouth daily.     No current facility-administered medications for this visit.     Allergies as of 03/08/2017 - Review Complete 03/08/2017  Allergen Reaction Noted  . Red dye Nausea And Vomiting 11/17/2014  . Gadolinium Nausea And Vomiting 04/18/2010    Family History  Problem Relation Age of Onset  . Hypertension Mother   . Cancer Father   . Colon cancer Neg Hx   . Diabetes Neg Hx   . Thyroid disease Neg Hx     Social History   Social History  . Marital status: Widowed    Spouse name: N/A  . Number of children: N/A  . Years of education: N/A   Occupational History  . Web designer    Social History Main Topics  . Smoking status: Never Smoker  . Smokeless tobacco: Never Used  . Alcohol use Yes     Comment: 2 oz wine M-F  . Drug use: No  . Sexual activity: Not on file  Other Topics Concern  . Not on file   Social History Narrative  . No narrative on file    Review of Systems: As mentioned in HPI   Physical Exam: BP 131/85   Pulse 72   Temp 97.1 F (36.2 C) (Oral)   Ht 6' (1.829 m)   Wt 168 lb (76.2 kg)   BMI 22.78 kg/m  General:   Alert and oriented. Pleasant and cooperative. Well-nourished and well-developed.  Head:  Normocephalic and atraumatic. Eyes:  Without icterus, sclera clear and conjunctiva pink.  Ears:  Normal auditory acuity. Nose:  No deformity, discharge,  or lesions. Mouth:  No deformity or lesions, oral mucosa pink.  Lungs:  Clear to auscultation bilaterally. No wheezes, rales, or rhonchi. No distress.  Heart:  S1, S2 present without murmurs appreciated.  Abdomen:  +BS, soft, non-tender and non-distended. No HSM noted. No  guarding or rebound. No masses appreciated.  Rectal:  Deferred  Msk:  Symmetrical without gross deformities. Normal posture. Extremities:  Without  edema. Neurologic:  Alert and  oriented x4;  grossly normal neurologically. Psych:  Alert and cooperative. Normal mood and affect.

## 2017-03-11 NOTE — Progress Notes (Signed)
cc'ed to pcp °

## 2017-03-15 DIAGNOSIS — F419 Anxiety disorder, unspecified: Secondary | ICD-10-CM | POA: Diagnosis not present

## 2017-03-15 DIAGNOSIS — Z6822 Body mass index (BMI) 22.0-22.9, adult: Secondary | ICD-10-CM | POA: Diagnosis not present

## 2017-05-17 NOTE — Progress Notes (Signed)
REVIEWED-NO ADDITIONAL RECOMMENDATIONS. 

## 2017-06-10 ENCOUNTER — Ambulatory Visit: Payer: Federal, State, Local not specified - PPO | Admitting: Gastroenterology

## 2017-07-30 ENCOUNTER — Ambulatory Visit (INDEPENDENT_AMBULATORY_CARE_PROVIDER_SITE_OTHER): Payer: Federal, State, Local not specified - PPO | Admitting: Urology

## 2017-07-30 DIAGNOSIS — N401 Enlarged prostate with lower urinary tract symptoms: Secondary | ICD-10-CM | POA: Diagnosis not present

## 2017-07-30 DIAGNOSIS — C678 Malignant neoplasm of overlapping sites of bladder: Secondary | ICD-10-CM | POA: Diagnosis not present

## 2017-08-20 DIAGNOSIS — F329 Major depressive disorder, single episode, unspecified: Secondary | ICD-10-CM | POA: Diagnosis not present

## 2017-08-20 DIAGNOSIS — R319 Hematuria, unspecified: Secondary | ICD-10-CM | POA: Diagnosis not present

## 2017-08-20 DIAGNOSIS — F419 Anxiety disorder, unspecified: Secondary | ICD-10-CM | POA: Diagnosis not present

## 2017-08-20 DIAGNOSIS — Z1389 Encounter for screening for other disorder: Secondary | ICD-10-CM | POA: Diagnosis not present

## 2017-08-20 DIAGNOSIS — N390 Urinary tract infection, site not specified: Secondary | ICD-10-CM | POA: Diagnosis not present

## 2017-08-20 DIAGNOSIS — Z6822 Body mass index (BMI) 22.0-22.9, adult: Secondary | ICD-10-CM | POA: Diagnosis not present

## 2017-09-19 DIAGNOSIS — Z6821 Body mass index (BMI) 21.0-21.9, adult: Secondary | ICD-10-CM | POA: Diagnosis not present

## 2017-09-19 DIAGNOSIS — Z1389 Encounter for screening for other disorder: Secondary | ICD-10-CM | POA: Diagnosis not present

## 2017-09-19 DIAGNOSIS — R1110 Vomiting, unspecified: Secondary | ICD-10-CM | POA: Diagnosis not present

## 2017-10-21 ENCOUNTER — Encounter: Payer: Self-pay | Admitting: Nurse Practitioner

## 2017-10-21 ENCOUNTER — Ambulatory Visit (INDEPENDENT_AMBULATORY_CARE_PROVIDER_SITE_OTHER): Payer: Federal, State, Local not specified - PPO | Admitting: Nurse Practitioner

## 2017-10-21 VITALS — BP 135/80 | HR 83 | Temp 97.0°F | Ht 72.0 in | Wt 170.0 lb

## 2017-10-21 DIAGNOSIS — R1031 Right lower quadrant pain: Secondary | ICD-10-CM

## 2017-10-21 DIAGNOSIS — R05 Cough: Secondary | ICD-10-CM

## 2017-10-21 DIAGNOSIS — R1110 Vomiting, unspecified: Secondary | ICD-10-CM

## 2017-10-21 DIAGNOSIS — R059 Cough, unspecified: Secondary | ICD-10-CM | POA: Insufficient documentation

## 2017-10-21 DIAGNOSIS — G8929 Other chronic pain: Secondary | ICD-10-CM

## 2017-10-21 NOTE — Patient Instructions (Addendum)
1. We will help schedule your swallowing test for you. 2. Return for follow-up in 6 months. 3. Continue taking your medications, specifically Protonix. 4. Call if you have any questions or concerns.

## 2017-10-21 NOTE — Assessment & Plan Note (Signed)
Vomiting has improved significantly.  Rare/intermittent episodes, not overtly concerning to him.  Continue to monitor, return for follow-up in 6 months.

## 2017-10-21 NOTE — Progress Notes (Signed)
Referring Provider: Redmond School, MD Primary Care Physician:  Redmond School, MD Primary GI:  Dr. Oneida Alar  Chief Complaint  Patient presents with  . Dysphagia    f/u. improved    HPI:   Chad Butler is a 66 y.o. male who presents for follow-up on dysphasia.  The patient was last seen in our office 03/08/2017 for abdominal pain and postprandial vomiting.  That he would vomit after breakfast but fine with lunch and dinner, Zofran not helping.  Essentially no upper GI symptoms.  No unexplained weight loss.  No early satiety.  Symptoms present for 2 months.  Sometimes right lower quadrant pain "where my hernia is located."  Last colonoscopy 2013 and due again in 2023.  Last EGD 2014 normal by Dr. Arnoldo Morale.  Today states he's doing ok overall. He is not having dysphagia issues. Had GERD after his last vitis. He stopped drinking OJ and symptoms resolved until the recent record snowfall. Has persistent cough which is waxing/waning in intensity from mild to "a deep cough." Denies seasonal allergies. He coughs up a lot of phlegm and mucus. If he does have vomiting (pretty rare at this point) he notes significant mucus. Taking Mucinex DM 2-3 times a week. Coughing up phlegm is throughout the day. Denies significant abdominal pain (does have intermittent nagging discomfort near his RLQ hernia). Is s/p cholecystectomy. Has intermittent hemorrhoid bleeding as toilet tissue hematochezia; not overly constipated. Preparation H tends to help. Denies chest pain, dyspnea, dizziness, lightheadedness, syncope, near syncope. Denies any other upper or lower GI symptoms.  Past Medical History:  Diagnosis Date  . Anxiety   . Bladder cancer ([place])   . Cancer ([place])   . Depression   . Hemorrhoids   . Hypertension     Past Surgical History:  Procedure Laterality Date  . CHOLECYSTECTOMY  2001  . COLONOSCOPY  11/09/2011   Dr. Oneida Alar: two tubular adenomas, mild diverticulosis, internal hemorrhoids, surveillance  in 10 years   . CYST EXCISION  1970's   back  . ESOPHAGOGASTRODUODENOSCOPY  10/07/2012   Dr. Arnoldo Morale: normal, Clotest negative  . HERNIA REPAIR  1956  . INGUINAL HERNIA REPAIR Right 11/04/2013   Procedure: RECURRENT RIGHT INGUINAL HERNIA REPAIR;  Surgeon: Jamesetta So, MD;  Location: [facility];  Service: General;  Laterality: Right;  . Left shoulder surgery  2011  . LESION REMOVAL N/A 11/04/2013   Procedure: EXCISION OF SKIN LESION ABDOMINAL WALL;  Surgeon: Jamesetta So, MD;  Location: [facility];  Service: General;  Laterality: N/A;    Current Outpatient Medications  Medication Sig Dispense Refill  . ALPRAZolam (XANAX) 0.5 MG tablet Take 0.5 mg by mouth at bedtime. For sleep.    Marland Kitchen aspirin EC 81 MG tablet Take 81 mg by mouth daily. Reported on 02/29/2016    . ibuprofen (ADVIL,MOTRIN) 200 MG tablet Take 200 mg by mouth as needed.    . Multiple Vitamin (MULITIVITAMIN WITH MINERALS) TABS Take 1 tablet by mouth every morning.     . pantoprazole (PROTONIX) 40 MG tablet Take 1 tablet (40 mg total) by mouth daily. Take 30 minutes before breakfast. 90 tablet 3  . tamsulosin (FLOMAX) 0.4 MG CAPS capsule Take 1 capsule by mouth daily.     No current facility-administered medications for this visit.     Allergies as of 10/21/2017 - Review Complete 10/21/2017  Allergen Reaction Noted  . Red dye Nausea And Vomiting 11/17/2014  . Gadolinium Nausea And Vomiting 04/18/2010  Family History  Problem Relation Age of Onset  . Hypertension Mother   . Cancer Father   . Colon cancer Neg Hx   . Diabetes Neg Hx   . Thyroid disease Neg Hx     Social History   Socioeconomic History  . Marital status: Widowed    Spouse name: None  . Number of children: None  . Years of education: None  . Highest education level: None  Social Needs  . Financial resource strain: None  . Food insecurity - worry: None  . Food insecurity - inability: None  . Transportation needs - medical: None  . Transportation needs -  non-medical: None  Occupational History  . Occupation: Web designer  Tobacco Use  . Smoking status: Never Smoker  . Smokeless tobacco: Never Used  Substance and Sexual Activity  . Alcohol use: Yes    Comment: 2 oz wine M-F  . Drug use: No  . Sexual activity: None  Other Topics Concern  . None  Social History Narrative  . None    Review of Systems: Complete ROS negative except as per HPI.   Physical Exam: BP 135/80   Pulse 83   Temp (!) 97 F (36.1 C) (Oral)   Ht 6' (1.829 m)   Wt 170 lb (77.1 kg)   BMI 23.06 kg/m  General:   Alert and oriented. Pleasant and cooperative. Well-nourished and well-developed.  Eyes:  Without icterus, sclera clear and conjunctiva pink.  Ears:  Normal auditory acuity. Cardiovascular:  S1, S2 present without murmurs appreciated. Extremities without clubbing or edema. Respiratory:  Clear to auscultation bilaterally. No wheezes, rales, or rhonchi. No distress. Persistent cough noted during exam/visit.  Gastrointestinal:  +BS, soft, non-tender and non-distended. No HSM noted. No guarding or rebound. No masses appreciated.  Rectal:  Deferred  Musculoskalatal:  Symmetrical without gross deformities. Neurologic:  Alert and oriented x4;  grossly normal neurologically. Psych:  Alert and cooperative. Normal mood and affect.    10/21/2017 10:46 AM   Disclaimer: This note was dictated with voice recognition software. Similar sounding words can inadvertently be transcribed and may not be corrected upon review.

## 2017-10-21 NOTE — Assessment & Plan Note (Signed)
Chronic, nagging right lower quadrant pain which is not overtly bothersome to him.  Continue to monitor, follow-up as needed.

## 2017-10-21 NOTE — Assessment & Plan Note (Addendum)
His biggest complaint today is of persistent cough.  This is not particularly associated postprandially, occurs throughout the day.  He has had an extensive upper GI workup to this point including EGD without findings for symptoms.  At this point we can plan a barium pill esophagram to check for reflux, risks for aspiration.  Doubt significant aspiration because he is afebrile.  If this is normal, it would likely be worth sending him back to primary care for other referral for possible ENT versus allergy evaluation given the fact that his cough is associated with significant phlegm production, coughing up mucus and phlegm.  Additionally, when he does rarely vomit at this time, he notes a lot of mucus and phlegm.  This sounds to be more of an ENT/upper respiratory issue rather than GI.  Follow-up in 6 months.

## 2017-10-21 NOTE — Progress Notes (Signed)
CC'D TO PCP °

## 2017-10-25 ENCOUNTER — Ambulatory Visit (HOSPITAL_COMMUNITY)
Admission: RE | Admit: 2017-10-25 | Discharge: 2017-10-25 | Disposition: A | Discharge status: Home / Self Care | Payer: Federal, State, Local not specified - PPO | Source: Ambulatory Visit | Attending: Nurse Practitioner | Admitting: Nurse Practitioner

## 2017-10-25 DIAGNOSIS — G8929 Other chronic pain: Secondary | ICD-10-CM | POA: Insufficient documentation

## 2017-10-25 DIAGNOSIS — R05 Cough: Secondary | ICD-10-CM | POA: Diagnosis not present

## 2017-10-25 DIAGNOSIS — R1110 Vomiting, unspecified: Secondary | ICD-10-CM | POA: Diagnosis not present

## 2017-10-25 DIAGNOSIS — R1031 Right lower quadrant pain: Secondary | ICD-10-CM | POA: Insufficient documentation

## 2017-10-25 DIAGNOSIS — R059 Cough, unspecified: Secondary | ICD-10-CM

## 2017-10-29 DIAGNOSIS — Z6822 Body mass index (BMI) 22.0-22.9, adult: Secondary | ICD-10-CM | POA: Diagnosis not present

## 2017-10-29 DIAGNOSIS — Z1389 Encounter for screening for other disorder: Secondary | ICD-10-CM | POA: Diagnosis not present

## 2017-10-29 DIAGNOSIS — K219 Gastro-esophageal reflux disease without esophagitis: Secondary | ICD-10-CM | POA: Diagnosis not present

## 2017-10-29 NOTE — Progress Notes (Signed)
Pt is aware. Forwarding to [place] to schedule next appt.

## 2017-10-29 NOTE — Progress Notes (Signed)
PATIENT SCHEDULED  °

## 2017-12-09 DIAGNOSIS — F419 Anxiety disorder, unspecified: Secondary | ICD-10-CM | POA: Diagnosis not present

## 2017-12-09 DIAGNOSIS — Z23 Encounter for immunization: Secondary | ICD-10-CM | POA: Diagnosis not present

## 2017-12-09 DIAGNOSIS — Z1389 Encounter for screening for other disorder: Secondary | ICD-10-CM | POA: Diagnosis not present

## 2017-12-09 DIAGNOSIS — Z6822 Body mass index (BMI) 22.0-22.9, adult: Secondary | ICD-10-CM | POA: Diagnosis not present

## 2018-01-28 ENCOUNTER — Ambulatory Visit (INDEPENDENT_AMBULATORY_CARE_PROVIDER_SITE_OTHER): Payer: Medicare Other | Admitting: Urology

## 2018-01-28 DIAGNOSIS — C678 Malignant neoplasm of overlapping sites of bladder: Secondary | ICD-10-CM | POA: Diagnosis not present

## 2018-01-28 DIAGNOSIS — N401 Enlarged prostate with lower urinary tract symptoms: Secondary | ICD-10-CM

## 2018-01-28 DIAGNOSIS — R351 Nocturia: Secondary | ICD-10-CM | POA: Diagnosis not present

## 2018-02-04 DIAGNOSIS — R630 Anorexia: Secondary | ICD-10-CM | POA: Diagnosis not present

## 2018-02-04 DIAGNOSIS — R5383 Other fatigue: Secondary | ICD-10-CM | POA: Diagnosis not present

## 2018-02-04 DIAGNOSIS — R350 Frequency of micturition: Secondary | ICD-10-CM | POA: Diagnosis not present

## 2018-02-04 DIAGNOSIS — Z6822 Body mass index (BMI) 22.0-22.9, adult: Secondary | ICD-10-CM | POA: Diagnosis not present

## 2018-04-21 ENCOUNTER — Ambulatory Visit (INDEPENDENT_AMBULATORY_CARE_PROVIDER_SITE_OTHER): Payer: Medicare Other | Admitting: Gastroenterology

## 2018-04-21 ENCOUNTER — Encounter: Payer: Self-pay | Admitting: Gastroenterology

## 2018-04-21 DIAGNOSIS — K649 Unspecified hemorrhoids: Secondary | ICD-10-CM | POA: Insufficient documentation

## 2018-04-21 DIAGNOSIS — K219 Gastro-esophageal reflux disease without esophagitis: Secondary | ICD-10-CM | POA: Diagnosis not present

## 2018-04-21 NOTE — Assessment & Plan Note (Signed)
67 year old gentleman who presented last year with intractable nausea and vomiting after breakfast only, likely related to reflux given improvement after addition of PPI and withholding orange juice.  He has remained stable over the past year.  Clinically doing very well.  He can try coming off of PPI with caveat of resuming it if he develops recurrent heartburn more than 3 times weekly, swallowing problems, recurrent vomiting.  We will see him back on a as needed basis only.  He will call with any questions or concerns.

## 2018-04-21 NOTE — Assessment & Plan Note (Signed)
Occasional flare of hemorrhoids with discomfort or bleeding.  He uses Preparation H with good results.  He inquired about [facility] banding which Dr. Gala Romney does here.  We also discussed Dr. Oneida Alar typically bands all hemorrhoids at time of flexible sigmoidoscopy.  Right now he wants to consider simply inquiring but let us know if he has any additional needs.  Be due for next colonoscopy in February 2023.

## 2018-04-21 NOTE — Progress Notes (Signed)
CC'D TO PCP °

## 2018-04-21 NOTE — Patient Instructions (Signed)
1. You are doing very well!  If you would like, you can try coming off of pantoprazole to see how you do.  If you have recurrent heartburn more than 3 times weekly, any issues swallowing or recurrent vomiting, then you need to continue pantoprazole daily. 2. You will be due for colonoscopy in February 2023.  We will send a reminder letter. 3. Call with any questions or concerns, otherwise office visit as needed.

## 2018-04-21 NOTE — Progress Notes (Signed)
      Primary Care Physician: Redmond School, MD  Primary Gastroenterologist:  Barney Drain, MD   Chief Complaint  Patient presents with  . Dysphagia    no longer an issues  . Emesis    has been doing fine    HPI: Chad Butler is a 67 y.o. male here for follow-up.  Doing very well.  Last year in 03/2017, when initially seen he was having vomiting after breakfast.  He was started on a PPI.  He eliminated orange juice from his diet.  Symptoms resolved.  He was having some issues with coughing and there was concern about possible swallowing issues.  He had a barium pill esophagram in January which was unremarkable.  Clinically he is doing well.  Denies heartburn, vomiting, abdominal pain.  Bowel movements are good.  Rarely uses Preparation H when his hemorrhoid flares.  Inquired about potential hemorrhoid banding, we discussed both methods offered at [facility] including [place] banding in office as well as hemorrhoid banding at time of sigmoidoscopy.  At this point he wants to continue to monitor but will call with any questions or concerns.  Next colonoscopy due in February 2023.  Patient aware.  Current Outpatient Medications  Medication Sig Dispense Refill  . ALPRAZolam (XANAX) 0.5 MG tablet Take 0.5 mg by mouth at bedtime. For sleep.    Marland Kitchen aspirin EC 81 MG tablet Take 81 mg by mouth daily. Reported on 02/29/2016    . ibuprofen (ADVIL,MOTRIN) 200 MG tablet Take 200 mg by mouth as needed.    . Multiple Vitamin (MULITIVITAMIN WITH MINERALS) TABS Take 1 tablet by mouth every morning.     . pantoprazole (PROTONIX) 40 MG tablet Take 1 tablet (40 mg total) by mouth daily. Take 30 minutes before breakfast. 90 tablet 3   No current facility-administered medications for this visit.     Allergies as of 04/21/2018 - Review Complete 04/21/2018  Allergen Reaction Noted  . Red dye Nausea And Vomiting 11/17/2014  . Gadolinium Nausea And Vomiting 04/18/2010    ROS:  General: Negative for anorexia,  weight loss, fever, chills, fatigue, weakness. ENT: Negative for hoarseness, difficulty swallowing , nasal congestion. CV: Negative for chest pain, angina, palpitations, dyspnea on exertion, peripheral edema.  Respiratory: Negative for dyspnea at rest, dyspnea on exertion, cough, sputum, wheezing.  GI: See history of present illness. GU:  Negative for dysuria, hematuria, urinary incontinence, urinary frequency, nocturnal urination.  Endo: Negative for unusual weight change.    Physical Examination:   BP 131/83   Pulse 81   Temp (!) 97.3 F (36.3 C) (Oral)   Ht 6' (1.829 m)   Wt 169 lb 3.2 oz (76.7 kg)   BMI 22.95 kg/m   General: Well-nourished, well-developed in no acute distress.  Eyes: No icterus. Mouth: Oropharyngeal mucosa moist and pink , no lesions erythema or exudate. Lungs: Clear to auscultation bilaterally.  Heart: Regular rate and rhythm, no murmurs rubs or gallops.  Abdomen: Bowel sounds are normal, nontender, nondistended, no hepatosplenomegaly or masses, no abdominal bruits or hernia , no rebound or guarding.   Extremities: No lower extremity edema. No clubbing or deformities. Neuro: Alert and oriented x 4   Skin: Warm and dry, no jaundice.   Psych: Alert and cooperative, normal mood and affect.

## 2018-04-30 DIAGNOSIS — R946 Abnormal results of thyroid function studies: Secondary | ICD-10-CM | POA: Diagnosis not present

## 2018-04-30 DIAGNOSIS — Z0001 Encounter for general adult medical examination with abnormal findings: Secondary | ICD-10-CM | POA: Diagnosis not present

## 2018-04-30 DIAGNOSIS — E785 Hyperlipidemia, unspecified: Secondary | ICD-10-CM | POA: Diagnosis not present

## 2018-04-30 DIAGNOSIS — C7A8 Other malignant neuroendocrine tumors: Secondary | ICD-10-CM | POA: Diagnosis not present

## 2018-04-30 DIAGNOSIS — E559 Vitamin D deficiency, unspecified: Secondary | ICD-10-CM | POA: Diagnosis not present

## 2018-04-30 DIAGNOSIS — Z6822 Body mass index (BMI) 22.0-22.9, adult: Secondary | ICD-10-CM | POA: Diagnosis not present

## 2018-04-30 DIAGNOSIS — G4733 Obstructive sleep apnea (adult) (pediatric): Secondary | ICD-10-CM | POA: Diagnosis not present

## 2018-04-30 DIAGNOSIS — K219 Gastro-esophageal reflux disease without esophagitis: Secondary | ICD-10-CM | POA: Diagnosis not present

## 2018-04-30 DIAGNOSIS — N400 Enlarged prostate without lower urinary tract symptoms: Secondary | ICD-10-CM | POA: Diagnosis not present

## 2018-04-30 DIAGNOSIS — E538 Deficiency of other specified B group vitamins: Secondary | ICD-10-CM | POA: Diagnosis not present

## 2018-04-30 DIAGNOSIS — Z1389 Encounter for screening for other disorder: Secondary | ICD-10-CM | POA: Diagnosis not present

## 2018-04-30 DIAGNOSIS — F419 Anxiety disorder, unspecified: Secondary | ICD-10-CM | POA: Diagnosis not present

## 2018-04-30 DIAGNOSIS — F329 Major depressive disorder, single episode, unspecified: Secondary | ICD-10-CM | POA: Diagnosis not present

## 2018-04-30 DIAGNOSIS — R5383 Other fatigue: Secondary | ICD-10-CM | POA: Diagnosis not present

## 2018-09-03 DIAGNOSIS — G4733 Obstructive sleep apnea (adult) (pediatric): Secondary | ICD-10-CM | POA: Diagnosis not present

## 2018-09-03 DIAGNOSIS — G4709 Other insomnia: Secondary | ICD-10-CM | POA: Diagnosis not present

## 2018-09-03 DIAGNOSIS — Z1389 Encounter for screening for other disorder: Secondary | ICD-10-CM | POA: Diagnosis not present

## 2018-09-03 DIAGNOSIS — Z6823 Body mass index (BMI) 23.0-23.9, adult: Secondary | ICD-10-CM | POA: Diagnosis not present

## 2018-09-12 DIAGNOSIS — M542 Cervicalgia: Secondary | ICD-10-CM | POA: Diagnosis not present

## 2018-09-12 DIAGNOSIS — M47812 Spondylosis without myelopathy or radiculopathy, cervical region: Secondary | ICD-10-CM | POA: Diagnosis not present

## 2018-09-30 DIAGNOSIS — M47012 Anterior spinal artery compression syndromes, cervical region: Secondary | ICD-10-CM | POA: Diagnosis not present

## 2018-10-09 DIAGNOSIS — M47812 Spondylosis without myelopathy or radiculopathy, cervical region: Secondary | ICD-10-CM | POA: Diagnosis not present

## 2018-10-09 DIAGNOSIS — R600 Localized edema: Secondary | ICD-10-CM | POA: Diagnosis not present

## 2018-10-09 DIAGNOSIS — M5412 Radiculopathy, cervical region: Secondary | ICD-10-CM | POA: Diagnosis not present

## 2018-10-09 DIAGNOSIS — G959 Disease of spinal cord, unspecified: Secondary | ICD-10-CM | POA: Diagnosis not present

## 2018-10-09 DIAGNOSIS — M5410 Radiculopathy, site unspecified: Secondary | ICD-10-CM | POA: Diagnosis not present

## 2018-10-09 DIAGNOSIS — M5021 Other cervical disc displacement,  high cervical region: Secondary | ICD-10-CM | POA: Diagnosis not present

## 2018-10-13 DIAGNOSIS — M4692 Unspecified inflammatory spondylopathy, cervical region: Secondary | ICD-10-CM | POA: Diagnosis not present

## 2018-10-21 DIAGNOSIS — M542 Cervicalgia: Secondary | ICD-10-CM | POA: Diagnosis not present

## 2018-10-21 DIAGNOSIS — R030 Elevated blood-pressure reading, without diagnosis of hypertension: Secondary | ICD-10-CM | POA: Diagnosis not present

## 2018-11-05 DIAGNOSIS — M542 Cervicalgia: Secondary | ICD-10-CM | POA: Diagnosis not present

## 2018-11-12 DIAGNOSIS — M542 Cervicalgia: Secondary | ICD-10-CM | POA: Diagnosis not present

## 2018-11-12 DIAGNOSIS — R42 Dizziness and giddiness: Secondary | ICD-10-CM | POA: Diagnosis not present

## 2018-11-12 DIAGNOSIS — Z6823 Body mass index (BMI) 23.0-23.9, adult: Secondary | ICD-10-CM | POA: Diagnosis not present

## 2018-11-12 DIAGNOSIS — R51 Headache: Secondary | ICD-10-CM | POA: Diagnosis not present

## 2018-11-13 DIAGNOSIS — R7309 Other abnormal glucose: Secondary | ICD-10-CM | POA: Diagnosis not present

## 2018-11-17 ENCOUNTER — Other Ambulatory Visit: Payer: Self-pay | Admitting: Physician Assistant

## 2018-11-17 ENCOUNTER — Other Ambulatory Visit (HOSPITAL_COMMUNITY): Payer: Self-pay | Admitting: Physician Assistant

## 2018-11-17 DIAGNOSIS — G44001 Cluster headache syndrome, unspecified, intractable: Secondary | ICD-10-CM

## 2018-11-17 DIAGNOSIS — R42 Dizziness and giddiness: Secondary | ICD-10-CM

## 2018-11-20 DIAGNOSIS — M542 Cervicalgia: Secondary | ICD-10-CM | POA: Diagnosis not present

## 2018-11-24 ENCOUNTER — Ambulatory Visit (HOSPITAL_COMMUNITY)
Admission: RE | Admit: 2018-11-24 | Discharge: 2018-11-24 | Disposition: A | Discharge status: Home / Self Care | Payer: Medicare Other | Source: Ambulatory Visit | Attending: Physician Assistant | Admitting: Physician Assistant

## 2018-11-24 DIAGNOSIS — R42 Dizziness and giddiness: Secondary | ICD-10-CM | POA: Insufficient documentation

## 2018-11-24 DIAGNOSIS — G44001 Cluster headache syndrome, unspecified, intractable: Secondary | ICD-10-CM | POA: Diagnosis not present

## 2018-11-24 LAB — POCT I-STAT CREATININE: Creatinine, Ser: 0.9 mg/dL (ref 0.61–1.24)

## 2018-11-24 MED ORDER — IOPAMIDOL (ISOVUE-370) INJECTION 76%
75.0000 mL | Freq: Once | INTRAVENOUS | Status: AC | PRN
  Administered 2018-11-24: 75 mL via INTRAVENOUS

## 2018-12-10 DIAGNOSIS — Z1389 Encounter for screening for other disorder: Secondary | ICD-10-CM | POA: Diagnosis not present

## 2018-12-10 DIAGNOSIS — J209 Acute bronchitis, unspecified: Secondary | ICD-10-CM | POA: Diagnosis not present

## 2018-12-10 DIAGNOSIS — Z6823 Body mass index (BMI) 23.0-23.9, adult: Secondary | ICD-10-CM | POA: Diagnosis not present

## 2019-01-16 ENCOUNTER — Other Ambulatory Visit: Payer: Self-pay

## 2019-01-16 ENCOUNTER — Other Ambulatory Visit (HOSPITAL_COMMUNITY): Payer: Self-pay | Admitting: Family Medicine

## 2019-01-16 ENCOUNTER — Ambulatory Visit (HOSPITAL_COMMUNITY)
Admission: RE | Admit: 2019-01-16 | Discharge: 2019-01-16 | Disposition: A | Discharge status: Home / Self Care | Payer: Medicare Other | Source: Ambulatory Visit | Attending: Family Medicine | Admitting: Family Medicine

## 2019-01-16 ENCOUNTER — Encounter (HOSPITAL_COMMUNITY): Payer: Self-pay

## 2019-01-16 DIAGNOSIS — Z6823 Body mass index (BMI) 23.0-23.9, adult: Secondary | ICD-10-CM | POA: Insufficient documentation

## 2019-01-16 DIAGNOSIS — R059 Cough, unspecified: Secondary | ICD-10-CM

## 2019-01-16 DIAGNOSIS — J069 Acute upper respiratory infection, unspecified: Secondary | ICD-10-CM | POA: Insufficient documentation

## 2019-01-16 DIAGNOSIS — R05 Cough: Secondary | ICD-10-CM

## 2019-06-02 DIAGNOSIS — Z6822 Body mass index (BMI) 22.0-22.9, adult: Secondary | ICD-10-CM | POA: Diagnosis not present

## 2019-06-02 DIAGNOSIS — Z23 Encounter for immunization: Secondary | ICD-10-CM | POA: Diagnosis not present

## 2019-06-02 DIAGNOSIS — E782 Mixed hyperlipidemia: Secondary | ICD-10-CM | POA: Diagnosis not present

## 2019-06-02 DIAGNOSIS — Z1389 Encounter for screening for other disorder: Secondary | ICD-10-CM | POA: Diagnosis not present

## 2019-06-02 DIAGNOSIS — Z0000 Encounter for general adult medical examination without abnormal findings: Secondary | ICD-10-CM | POA: Diagnosis not present

## 2019-06-02 DIAGNOSIS — Z0001 Encounter for general adult medical examination with abnormal findings: Secondary | ICD-10-CM | POA: Diagnosis not present

## 2019-07-14 DIAGNOSIS — Z6822 Body mass index (BMI) 22.0-22.9, adult: Secondary | ICD-10-CM | POA: Diagnosis not present

## 2019-07-14 DIAGNOSIS — Z23 Encounter for immunization: Secondary | ICD-10-CM | POA: Diagnosis not present

## 2019-07-14 DIAGNOSIS — K4090 Unilateral inguinal hernia, without obstruction or gangrene, not specified as recurrent: Secondary | ICD-10-CM | POA: Diagnosis not present

## 2019-09-04 ENCOUNTER — Other Ambulatory Visit: Payer: Self-pay

## 2019-09-04 DIAGNOSIS — Z20822 Contact with and (suspected) exposure to covid-19: Secondary | ICD-10-CM

## 2019-09-04 DIAGNOSIS — Z20828 Contact with and (suspected) exposure to other viral communicable diseases: Secondary | ICD-10-CM | POA: Diagnosis not present

## 2019-09-05 LAB — NOVEL CORONAVIRUS, NAA: SARS-CoV-2, NAA: NOT DETECTED

## 2019-09-07 ENCOUNTER — Telehealth: Payer: Self-pay | Admitting: *Deleted

## 2019-09-07 NOTE — Telephone Encounter (Signed)
He called in requesting his COVID-19 test result.   I let him know it was not detected meaning he did not have the virus.

## 2019-11-25 DIAGNOSIS — Z6822 Body mass index (BMI) 22.0-22.9, adult: Secondary | ICD-10-CM | POA: Diagnosis not present

## 2019-11-25 DIAGNOSIS — F419 Anxiety disorder, unspecified: Secondary | ICD-10-CM | POA: Diagnosis not present

## 2020-02-11 DIAGNOSIS — K219 Gastro-esophageal reflux disease without esophagitis: Secondary | ICD-10-CM | POA: Diagnosis not present

## 2020-02-11 DIAGNOSIS — Z6822 Body mass index (BMI) 22.0-22.9, adult: Secondary | ICD-10-CM | POA: Diagnosis not present

## 2020-02-11 DIAGNOSIS — M719 Bursopathy, unspecified: Secondary | ICD-10-CM | POA: Diagnosis not present

## 2020-02-11 DIAGNOSIS — N400 Enlarged prostate without lower urinary tract symptoms: Secondary | ICD-10-CM | POA: Diagnosis not present

## 2020-02-11 DIAGNOSIS — M1388 Other specified arthritis, other site: Secondary | ICD-10-CM | POA: Diagnosis not present

## 2020-02-11 DIAGNOSIS — R252 Cramp and spasm: Secondary | ICD-10-CM | POA: Diagnosis not present

## 2020-02-22 ENCOUNTER — Encounter: Payer: Self-pay | Admitting: Emergency Medicine

## 2020-02-22 ENCOUNTER — Ambulatory Visit
Admission: EM | Admit: 2020-02-22 | Discharge: 2020-02-22 | Disposition: A | Discharge status: Home / Self Care | Payer: Medicare Other | Attending: Emergency Medicine | Admitting: Emergency Medicine

## 2020-02-22 ENCOUNTER — Other Ambulatory Visit: Payer: Self-pay

## 2020-02-22 DIAGNOSIS — R509 Fever, unspecified: Secondary | ICD-10-CM | POA: Insufficient documentation

## 2020-02-22 DIAGNOSIS — R350 Frequency of micturition: Secondary | ICD-10-CM | POA: Diagnosis not present

## 2020-02-22 LAB — POCT URINALYSIS DIP (MANUAL ENTRY)
Glucose, UA: NEGATIVE mg/dL
Nitrite, UA: NEGATIVE
Protein Ur, POC: 100 mg/dL — AB
Spec Grav, UA: 1.025 (ref 1.010–1.025)
Urobilinogen, UA: 1 E.U./dL
pH, UA: 5.5 (ref 5.0–8.0)

## 2020-02-22 MED ORDER — CIPROFLOXACIN HCL 500 MG PO TABS: 500.0000 mg | ORAL_TABLET | Freq: Two times a day (BID) | ORAL | 0 refills | Status: AC

## 2020-02-22 MED ORDER — CEFTRIAXONE SODIUM 1 G IJ SOLR
1.0000 g | Freq: Once | INTRAMUSCULAR | Status: AC
  Administered 2020-02-22: 1 g via INTRAMUSCULAR

## 2020-02-22 NOTE — ED Triage Notes (Signed)
Reports urinary frequency, no energy , loss of appetite  since Friday.  Pt states he had chills this morning, his temp was 100.3, did not take any med for fever.

## 2020-02-22 NOTE — Discharge Instructions (Signed)
Patient unable to give urine sample in office Patient will collect sample at home and bring in back  In the meantime based on symptoms will treated for complicated UTI Rocephin given in office Push fluids and get plenty of rest.   Take antibiotic as directed and to completion Follow up with PCP if symptoms persists Return here or go to ER if you have any new or worsening symptoms such as fever, abdominal pain, nausea/vomiting, flank pain, worsening symptoms despite medications, etc..Marland Kitchen

## 2020-02-22 NOTE — ED Provider Notes (Signed)
[facility]   CC: Urinary frequency  SUBJECTIVE:  Chad Butler is a 69 y.o. male who complains of fatigue, urinary frequency, and fever, tmax of 100.3, x 3 days.  Patient denies a precipitating event, delayed bathroom break, excessive caffeine intake.  Denies abdominal or flank pain.  Has NOT tried OTC medications.  Symptoms are made worse with urination.  Denies similar symptoms in the past.  Denies chills, nausea, vomiting, abdominal pain, flank pain, urethral discharge, testicular pain/ swelling, hematuria.    LMP: No LMP for male patient.  ROS: As in HPI.  All other pertinent ROS negative.     Past Medical History:  Diagnosis Date  . Anxiety   . Bladder cancer ([place])   . Cancer ([place])   . Depression   . Hemorrhoids   . Hypertension    Past Surgical History:  Procedure Laterality Date  . CHOLECYSTECTOMY  2001  . COLONOSCOPY  11/09/2011   Dr. Oneida Alar: two tubular adenomas, mild diverticulosis, internal hemorrhoids, surveillance in 10 years   . CYST EXCISION  1970's   back  . ESOPHAGOGASTRODUODENOSCOPY  10/07/2012   Dr. Arnoldo Morale: normal, Clotest negative  . HERNIA REPAIR  1956  . INGUINAL HERNIA REPAIR Right 11/04/2013   Procedure: RECURRENT RIGHT INGUINAL HERNIA REPAIR;  Surgeon: Jamesetta So, MD;  Location: [facility];  Service: General;  Laterality: Right;  . Left shoulder surgery  2011  . LESION REMOVAL N/A 11/04/2013   Procedure: EXCISION OF SKIN LESION ABDOMINAL WALL;  Surgeon: Jamesetta So, MD;  Location: [facility];  Service: General;  Laterality: N/A;   Allergies  Allergen Reactions  . Red Dye Nausea And Vomiting  . Gadolinium Nausea And Vomiting     Code: VOM, with injection, no other reactions noted, Onset Date: [id number]    No current facility-administered medications on file prior to encounter.   Current Outpatient Medications on File Prior to Encounter  Medication Sig Dispense Refill  . ALPRAZolam (XANAX) 0.5 MG tablet Take 0.5 mg by mouth at bedtime. For  sleep.    Marland Kitchen omeprazole (PRILOSEC) 20 MG capsule Take 20 mg by mouth daily.    . tamsulosin (FLOMAX) 0.4 MG CAPS capsule Take 0.4 mg by mouth.    . Multiple Vitamin (MULITIVITAMIN WITH MINERALS) TABS Take 1 tablet by mouth every morning.     . [DISCONTINUED] pantoprazole (PROTONIX) 40 MG tablet Take 1 tablet (40 mg total) by mouth daily. Take 30 minutes before breakfast. 90 tablet 3   Social History   Socioeconomic History  . Marital status: Widowed    Spouse name: Not on file  . Number of children: Not on file  . Years of education: Not on file  . Highest education level: Not on file  Occupational History  . Occupation: Web designer  Tobacco Use  . Smoking status: Never Smoker  . Smokeless tobacco: Never Used  Substance and Sexual Activity  . Alcohol use: Yes    Comment: 2 oz wine M-F  . Drug use: No  . Sexual activity: Not on file  Other Topics Concern  . Not on file  Social History Narrative  . Not on file   Social Determinants of Health   Financial Resource Strain:   . Difficulty of Paying Living Expenses:   Food Insecurity:   . Worried About Charity fundraiser in the Last Year:   . Arboriculturist in the Last Year:   Transportation Needs:   . Lack of Transportation (  Medical):   Marland Kitchen Lack of Transportation (Non-Medical):   Physical Activity:   . Days of Exercise per Week:   . Minutes of Exercise per Session:   Stress:   . Feeling of Stress :   Social Connections:   . Frequency of Communication with Friends and Family:   . Frequency of Social Gatherings with Friends and Family:   . Attends Religious Services:   . Active Member of Clubs or Organizations:   . Attends Archivist Meetings:   Marland Kitchen Marital Status:   Intimate Partner Violence:   . Fear of Current or Ex-Partner:   . Emotionally Abused:   Marland Kitchen Physically Abused:   . Sexually Abused:    Family History  Problem Relation Age of Onset  . Hypertension Mother   . Cancer Father   . Colon  cancer Neg Hx   . Diabetes Neg Hx   . Thyroid disease Neg Hx     OBJECTIVE:  Vitals:   02/22/20 1429 02/22/20 1432  BP:  114/73  Pulse:  (!) 119  Resp:  19  Temp:  99.6 F (37.6 C)  TempSrc:  Oral  SpO2:  96%  Weight: 170 lb (77.1 kg)   Height: 6' (1.829 m)    General appearance: Alert, ill-appearing, nontoxic HEENT: NCAT.  Oropharynx clear.  Lungs: clear to auscultation bilaterally without adventitious breath sounds Heart: Tachycardic   Abdomen: soft; non-distended; no tenderness; bowel sounds present; no guarding Back: no CVA tenderness Extremities: no edema; symmetrical with no gross deformities Skin: warm and dry Neurologic: Ambulates from chair to exam table without difficulty Psychological: alert and cooperative; normal mood and affect  Labs Reviewed  URINE CULTURE  POCT URINALYSIS DIP (MANUAL ENTRY)    ASSESSMENT & PLAN:  1. Urinary frequency   2. Fever, unspecified     Meds ordered this encounter  Medications  . ciprofloxacin (CIPRO) 500 MG tablet    Sig: Take 1 tablet (500 mg total) by mouth 2 (two) times daily for 10 days.    Dispense:  20 tablet    Refill:  0    Order Specific Question:   Supervising Provider    Answer:   Raylene Everts [id number]  . cefTRIAXone (ROCEPHIN) injection 1 g    Patient unable to give urine sample in office Patient will collect sample at home and bring in back  In the meantime based on symptoms will treated for complicated UTI Rocephin given in office Push fluids and get plenty of rest.   Take antibiotic as directed and to completion Follow up with PCP if symptoms persists Return here or go to ER if you have any new or worsening symptoms such as fever, abdominal pain, nausea/vomiting, flank pain, worsening symptoms despite medications, etc...  Given UTI symptoms with tachycardia and fever cannot rule out sepsis in urgent care setting.  Offered patient further evaluation and management in the ED.  Patient declines  at this time and would like to try outpatient therapy first.  Aware of the risk associated with this decision including missed diagnosis, organ damage, organ failure, and/or death.  Patient aware and in agreement.     Outlined signs and symptoms indicating need for more acute intervention. Patient verbalized understanding. After Visit Summary given.     Lestine Box, PA-C 02/22/20 1535

## 2020-02-24 LAB — URINE CULTURE: Culture: >=100000 — AB

## 2020-03-07 DIAGNOSIS — M79651 Pain in right thigh: Secondary | ICD-10-CM | POA: Diagnosis not present

## 2020-03-07 DIAGNOSIS — N400 Enlarged prostate without lower urinary tract symptoms: Secondary | ICD-10-CM | POA: Diagnosis not present

## 2020-03-07 DIAGNOSIS — M533 Sacrococcygeal disorders, not elsewhere classified: Secondary | ICD-10-CM | POA: Diagnosis not present

## 2020-03-07 DIAGNOSIS — R102 Pelvic and perineal pain: Secondary | ICD-10-CM | POA: Diagnosis not present

## 2020-04-26 DIAGNOSIS — N400 Enlarged prostate without lower urinary tract symptoms: Secondary | ICD-10-CM | POA: Diagnosis not present

## 2020-06-02 DIAGNOSIS — R7309 Other abnormal glucose: Secondary | ICD-10-CM | POA: Diagnosis not present

## 2020-06-02 DIAGNOSIS — Z0001 Encounter for general adult medical examination with abnormal findings: Secondary | ICD-10-CM | POA: Diagnosis not present

## 2020-06-02 DIAGNOSIS — Z0000 Encounter for general adult medical examination without abnormal findings: Secondary | ICD-10-CM | POA: Diagnosis not present

## 2020-06-02 DIAGNOSIS — Z6822 Body mass index (BMI) 22.0-22.9, adult: Secondary | ICD-10-CM | POA: Diagnosis not present

## 2020-06-02 DIAGNOSIS — Z1389 Encounter for screening for other disorder: Secondary | ICD-10-CM | POA: Diagnosis not present

## 2020-07-15 DIAGNOSIS — Z23 Encounter for immunization: Secondary | ICD-10-CM | POA: Diagnosis not present

## 2020-07-29 DIAGNOSIS — H2513 Age-related nuclear cataract, bilateral: Secondary | ICD-10-CM | POA: Diagnosis not present

## 2020-09-09 DIAGNOSIS — Q8501 Neurofibromatosis, type 1: Secondary | ICD-10-CM | POA: Diagnosis not present

## 2020-09-09 DIAGNOSIS — R52 Pain, unspecified: Secondary | ICD-10-CM | POA: Diagnosis not present

## 2020-09-09 DIAGNOSIS — Z6822 Body mass index (BMI) 22.0-22.9, adult: Secondary | ICD-10-CM | POA: Diagnosis not present

## 2020-09-28 DIAGNOSIS — Z681 Body mass index (BMI) 19 or less, adult: Secondary | ICD-10-CM | POA: Diagnosis not present

## 2020-09-28 DIAGNOSIS — R051 Acute cough: Secondary | ICD-10-CM | POA: Diagnosis not present

## 2020-09-28 DIAGNOSIS — F419 Anxiety disorder, unspecified: Secondary | ICD-10-CM | POA: Diagnosis not present

## 2020-09-28 DIAGNOSIS — R634 Abnormal weight loss: Secondary | ICD-10-CM | POA: Diagnosis not present

## 2020-10-03 ENCOUNTER — Other Ambulatory Visit: Payer: Federal, State, Local not specified - PPO

## 2020-10-03 DIAGNOSIS — Z20822 Contact with and (suspected) exposure to covid-19: Secondary | ICD-10-CM

## 2020-10-04 LAB — NOVEL CORONAVIRUS, NAA: SARS-CoV-2, NAA: NOT DETECTED

## 2020-10-04 LAB — SARS-COV-2, NAA 2 DAY TAT

## 2020-11-01 ENCOUNTER — Encounter: Payer: Self-pay | Admitting: Urology

## 2020-11-01 ENCOUNTER — Other Ambulatory Visit: Payer: Self-pay

## 2020-11-01 ENCOUNTER — Ambulatory Visit (INDEPENDENT_AMBULATORY_CARE_PROVIDER_SITE_OTHER): Payer: Medicare Other | Admitting: Urology

## 2020-11-01 VITALS — BP 142/85 | HR 89 | Temp 97.6°F | Ht 73.0 in | Wt 168.0 lb

## 2020-11-01 DIAGNOSIS — Z8551 Personal history of malignant neoplasm of bladder: Secondary | ICD-10-CM

## 2020-11-01 DIAGNOSIS — N138 Other obstructive and reflux uropathy: Secondary | ICD-10-CM

## 2020-11-01 DIAGNOSIS — N401 Enlarged prostate with lower urinary tract symptoms: Secondary | ICD-10-CM | POA: Diagnosis not present

## 2020-11-01 DIAGNOSIS — R39198 Other difficulties with micturition: Secondary | ICD-10-CM

## 2020-11-01 DIAGNOSIS — N529 Male erectile dysfunction, unspecified: Secondary | ICD-10-CM

## 2020-11-01 LAB — MICROSCOPIC EXAMINATION
Epithelial Cells (non renal): NONE SEEN /hpf (ref 0–10)
Renal Epithel, UA: NONE SEEN /hpf
WBC, UA: NONE SEEN /hpf (ref 0–5)

## 2020-11-01 LAB — URINALYSIS, ROUTINE W REFLEX MICROSCOPIC
Bilirubin, UA: NEGATIVE
Glucose, UA: NEGATIVE
Ketones, UA: NEGATIVE
Leukocytes,UA: NEGATIVE
Nitrite, UA: NEGATIVE
Specific Gravity, UA: 1.025 (ref 1.005–1.030)
Urobilinogen, Ur: 0.2 mg/dL (ref 0.2–1.0)
pH, UA: 6.5 (ref 5.0–7.5)

## 2020-11-01 MED ORDER — SILDENAFIL CITRATE 100 MG PO TABS: 100.0000 mg | ORAL_TABLET | Freq: Every day | ORAL | 99 refills | Status: DC | PRN

## 2020-11-01 MED ORDER — CIPROFLOXACIN HCL 500 MG PO TABS
500.0000 mg | ORAL_TABLET | Freq: Once | ORAL | Status: AC
  Administered 2020-11-01: 500 mg via ORAL

## 2020-11-01 NOTE — Progress Notes (Signed)
Urological Symptom Review  Patient is experiencing the following symptoms: Erection problems (male only)   Review of Systems  Gastrointestinal (upper)  : Vomiting  Gastrointestinal (lower) : Negative for lower GI symptoms  Constitutional : Negative for symptoms  Skin: Negative for skin symptoms  Eyes: Negative for eye symptoms  Ear/Nose/Throat : Negative for Ear/Nose/Throat symptoms  Hematologic/Lymphatic: Negative for Hematologic/Lymphatic symptoms  Cardiovascular : Negative for cardiovascular symptoms  Respiratory : Negative for respiratory symptoms  Endocrine: Negative for endocrine symptoms  Musculoskeletal: Negative for musculoskeletal symptoms  Neurological: Negative for neurological symptoms  Psychologic: Negative for psychiatric symptoms

## 2020-11-01 NOTE — Progress Notes (Signed)
H&P  Chief Complaint: Hx Bladder Cancer  History of Present Illness:   2.1.2022 - Chad Butler his here after being lost to f/u of his bladder cancer in 2019. He underwent a TURBT in 2017 (by Dr Exie Parody). He denies any gross hematuria between visits but notes some occasional urinary hesitancy. He notes that he has been experiencing an extended period of nausea/vomiting starting on 1.2.22 (he estimates that he has been unable to retain his meal 13x). He reports regular bowel movements.  ED: Pt reports that he has experienced long-term, persistent erectile dysfunction.  IPSS Questionnaire (AUA-7): Over the past month.   1)  How often have you had a sensation of not emptying your bladder completely after you finish urinating?  1 - Less than 1 time in 5  2)  How often have you had to urinate again less than two hours after you finished urinating? 1 - Less than 1 time in 5  3)  How often have you found you stopped and started again several times when you urinated?  0 - Not at all  4) How difficult have you found it to postpone urination?  0 - Not at all  5) How often have you had a weak urinary stream?  1 - Less than 1 time in 5  6) How often have you had to push or strain to begin urination?  0 - Not at all  7) How many times did you most typically get up to urinate from the time you went to bed until the time you got up in the morning?  2 - 2 times  Total score:  0-7 mildly symptomatic   8-19 moderately symptomatic   20-35 severely symptomatic  IPSS: 5 QoL Score: 3   (below copied from [facility] records):     He underwent TURBT of a fairly large bladder tumor on 02/01/2016. This revealed stage Ta low-grade papillary urothelial carcinoma. He has had 2 surveillance cystoscopies since that time, the last in April 2019. He has had no evidence of recurrence.   01/28/2018: No blood in his urine since last visit here 6 months ago.   CC: BPH    01/28/2018: Urinary symptoms have improved with Flomax  and are non-bothersome.   CC/HPI: Adrenal mass  He was found to have an indeterminate left adrenal mass on CT scanning. Apparently, it was 3.6 cm in size in 2017. By history it was not functional, and grew from 3.3-3.6 cm in size over a period of 6 years. He underwent robotic-assisted removal in 2017 by Dr. Brendia Sacks at [facility].      Past Medical History:  Diagnosis Date  . Anxiety   . Bladder cancer ([place])   . Cancer ([place])   . Depression   . Hemorrhoids   . Hypertension     Past Surgical History:  Procedure Laterality Date  . CHOLECYSTECTOMY  2001  . COLONOSCOPY  11/09/2011   Dr. Oneida Alar: two tubular adenomas, mild diverticulosis, internal hemorrhoids, surveillance in 10 years   . CYST EXCISION  1970's   back  . ESOPHAGOGASTRODUODENOSCOPY  10/07/2012   Dr. Arnoldo Morale: normal, Clotest negative  . HERNIA REPAIR  1956  . INGUINAL HERNIA REPAIR Right 11/04/2013   Procedure: RECURRENT RIGHT INGUINAL HERNIA REPAIR;  Surgeon: Jamesetta So, MD;  Location: [facility];  Service: General;  Laterality: Right;  . Left shoulder surgery  2011  . LESION REMOVAL N/A 11/04/2013   Procedure: EXCISION OF SKIN LESION ABDOMINAL WALL;  Surgeon: Elta Guadeloupe  Lowella Petties, MD;  Location: [facility];  Service: General;  Laterality: N/A;    Home Medications:  Allergies as of 11/01/2020      Reactions   Red Dye Nausea And Vomiting   Gadolinium Nausea And Vomiting    Code: VOM, with injection, no other reactions noted, Onset Date: [id number]      Medication List       Accurate as of November 01, 2020 10:08 AM. If you have any questions, ask your nurse or doctor.        ALPRAZolam 0.5 MG tablet Commonly known as: XANAX Take 0.5 mg by mouth at bedtime. For sleep.   multivitamin with minerals Tabs tablet Take 1 tablet by mouth every morning.   omeprazole 20 MG capsule Commonly known as: PRILOSEC Take 20 mg by mouth daily.   tamsulosin 0.4 MG Caps capsule Commonly known as: FLOMAX Take 0.4 mg by mouth.        Allergies:  Allergies  Allergen Reactions  . Red Dye Nausea And Vomiting  . Gadolinium Nausea And Vomiting     Code: VOM, with injection, no other reactions noted, Onset Date: [id number]     Family History  Problem Relation Age of Onset  . Hypertension Mother   . Cancer Father   . Colon cancer Neg Hx   . Diabetes Neg Hx   . Thyroid disease Neg Hx     Social History:  reports that he has never smoked. He has never used smokeless tobacco. He reports current alcohol use. He reports that he does not use drugs.  ROS: A complete review of systems was performed.  All systems are negative except for pertinent findings as noted.  Physical Exam:  Vital signs in last 24 hours: BP (!) 142/85   Pulse 89   Temp 97.6 F (36.4 C)   Ht 6\' 1"  (1.854 m)   Wt 168 lb (76.2 kg)   BMI 22.16 kg/m  Constitutional:  Alert and oriented, No acute distress Cardiovascular: Regular rate  GI: Abdomen is soft, nontender, nondistended, no abdominal masses. No CVAT. No hernias. Genitourinary: Normal male phallus, testes are descended bilaterally and non-tender and without masses, scrotum is normal in appearance without lesions or masses, perineum is normal on inspection. Neurologic: Grossly intact, no focal deficits Psychiatric: Normal mood and affect  I have reviewed prior pt notes  I have reviewed notes from referring/previous physicians  I have reviewed urinalysis results  I have independently reviewed prior imaging  Cystoscopy Procedure Note:  Indication: Follow-up of bladder cancer  After informed consent and discussion of the procedure and its risks, Chad Butler was positioned and prepped in the standard fashion.  Cystoscopy was performed with a flexible cystoscope.   Findings: Urethra: No lesions/stricture Prostate: Bilobar hypertrophy with obstruction Bladder neck: Normal Ureteral orifices: Normally positioned and configured bilaterally Bladder: Mild trabeculations, scattered.   No urothelial lesions noted.  No stones.  The patient tolerated the procedure well.        Impression/Assessment:  Hx of Bladder Cancer - Pt underwent cysto today revealing no evidence of recurrent tumors.  BPH--on tamsulosin, doing well  ED--he would like trial of sildenafil  Plan:  1. Cysto covered by [facility] today.  2. F/U in 1 year for OV, cysto, and symptom recheck.  3.  Sildenafil sent in  CC: Dr. Redmond School

## 2020-11-02 DIAGNOSIS — R1110 Vomiting, unspecified: Secondary | ICD-10-CM | POA: Diagnosis not present

## 2020-11-02 DIAGNOSIS — Z1389 Encounter for screening for other disorder: Secondary | ICD-10-CM | POA: Diagnosis not present

## 2020-11-02 DIAGNOSIS — Z1331 Encounter for screening for depression: Secondary | ICD-10-CM | POA: Diagnosis not present

## 2020-11-02 DIAGNOSIS — R110 Nausea: Secondary | ICD-10-CM | POA: Diagnosis not present

## 2020-11-02 DIAGNOSIS — Z6821 Body mass index (BMI) 21.0-21.9, adult: Secondary | ICD-10-CM | POA: Diagnosis not present

## 2020-11-21 ENCOUNTER — Encounter: Payer: Self-pay | Admitting: Internal Medicine

## 2020-11-22 DIAGNOSIS — M7731 Calcaneal spur, right foot: Secondary | ICD-10-CM | POA: Diagnosis not present

## 2020-11-22 DIAGNOSIS — M722 Plantar fascial fibromatosis: Secondary | ICD-10-CM | POA: Diagnosis not present

## 2020-11-22 DIAGNOSIS — Z6821 Body mass index (BMI) 21.0-21.9, adult: Secondary | ICD-10-CM | POA: Diagnosis not present

## 2020-12-22 ENCOUNTER — Ambulatory Visit: Payer: Medicare Other | Admitting: Internal Medicine

## 2021-04-04 DIAGNOSIS — Z20822 Contact with and (suspected) exposure to covid-19: Secondary | ICD-10-CM | POA: Diagnosis not present

## 2021-05-04 DIAGNOSIS — R350 Frequency of micturition: Secondary | ICD-10-CM | POA: Diagnosis not present

## 2021-05-04 DIAGNOSIS — R519 Headache, unspecified: Secondary | ICD-10-CM | POA: Diagnosis not present

## 2021-05-04 DIAGNOSIS — Z6821 Body mass index (BMI) 21.0-21.9, adult: Secondary | ICD-10-CM | POA: Diagnosis not present

## 2021-05-04 DIAGNOSIS — N400 Enlarged prostate without lower urinary tract symptoms: Secondary | ICD-10-CM | POA: Diagnosis not present

## 2021-05-04 DIAGNOSIS — G4709 Other insomnia: Secondary | ICD-10-CM | POA: Diagnosis not present

## 2021-05-05 DIAGNOSIS — R350 Frequency of micturition: Secondary | ICD-10-CM | POA: Diagnosis not present

## 2021-06-05 DIAGNOSIS — Z20822 Contact with and (suspected) exposure to covid-19: Secondary | ICD-10-CM | POA: Diagnosis not present

## 2021-06-16 ENCOUNTER — Telehealth: Payer: Self-pay

## 2021-06-16 NOTE — Telephone Encounter (Signed)
Patient is having occasional blood and pus with low stream of urine. Also having frequent urination.  He has an appt in Nov(first available) He wants to know if he needs to be seen sooner?  Please advise.  Call back:  [phone]  Thanks, Helene Kelp

## 2021-06-16 NOTE — Telephone Encounter (Signed)
Appt scheduled to see Dr. Felipa Eth Monday at 10:45. Patient voiced understanding.

## 2021-06-16 NOTE — Telephone Encounter (Signed)
Message left to return call to office. Will offer appt today to see MD here or for Monday if patient prefers.

## 2021-06-19 ENCOUNTER — Encounter: Payer: Self-pay | Admitting: Urology

## 2021-06-19 ENCOUNTER — Ambulatory Visit (INDEPENDENT_AMBULATORY_CARE_PROVIDER_SITE_OTHER): Payer: Medicare Other | Admitting: Urology

## 2021-06-19 ENCOUNTER — Other Ambulatory Visit: Payer: Self-pay

## 2021-06-19 VITALS — BP 165/82 | HR 80 | Ht 73.0 in | Wt 169.0 lb

## 2021-06-19 DIAGNOSIS — R339 Retention of urine, unspecified: Secondary | ICD-10-CM

## 2021-06-19 DIAGNOSIS — Z8551 Personal history of malignant neoplasm of bladder: Secondary | ICD-10-CM

## 2021-06-19 DIAGNOSIS — N138 Other obstructive and reflux uropathy: Secondary | ICD-10-CM

## 2021-06-19 DIAGNOSIS — N410 Acute prostatitis: Secondary | ICD-10-CM | POA: Diagnosis not present

## 2021-06-19 DIAGNOSIS — N401 Enlarged prostate with lower urinary tract symptoms: Secondary | ICD-10-CM | POA: Diagnosis not present

## 2021-06-19 DIAGNOSIS — R350 Frequency of micturition: Secondary | ICD-10-CM | POA: Diagnosis not present

## 2021-06-19 DIAGNOSIS — N529 Male erectile dysfunction, unspecified: Secondary | ICD-10-CM | POA: Diagnosis not present

## 2021-06-19 LAB — URINALYSIS, ROUTINE W REFLEX MICROSCOPIC
Bilirubin, UA: NEGATIVE
Glucose, UA: NEGATIVE
Ketones, UA: NEGATIVE
Nitrite, UA: NEGATIVE
Specific Gravity, UA: 1.02 (ref 1.005–1.030)
Urobilinogen, Ur: 0.2 mg/dL (ref 0.2–1.0)
pH, UA: 6 (ref 5.0–7.5)

## 2021-06-19 LAB — MICROSCOPIC EXAMINATION
Bacteria, UA: NONE SEEN
Renal Epithel, UA: NONE SEEN /hpf

## 2021-06-19 LAB — BLADDER SCAN AMB NON-IMAGING: Scan Result: 200

## 2021-06-19 MED ORDER — SILODOSIN 8 MG PO CAPS: 8.0000 mg | ORAL_CAPSULE | Freq: Every day | ORAL | 11 refills | Status: DC

## 2021-06-19 NOTE — Progress Notes (Signed)
Assessment: 1. Urinary frequency   2. BPH with obstruction/lower urinary tract symptoms   3. History of bladder cancer   4. Incomplete bladder emptying   5. Organic impotence     Plan: PSA today Trial of silodosin 8 mg daily in place of tamsulosin.  Rx sent. Discussed repeat trial of sildenafil prn for ED Return to office in 1 month for possible cystoscopy   Chief Complaint  Patient presents with   Urinary Frequency     HPI: Chad Butler is a 70 y.o. male who presents for continued evaluation of lower urinary tract symptoms.  He has a history of bladder cancer undergoing a TURBT in 2017 for Ta low-grade papillary urothelial carcinoma.  He has not had any evidence of recurrence on follow-up cystoscopy.  His last cystoscopy was performed on 11/01/2020 by Dr. Diona Fanti.  He also has a history of a left adrenal mass found incidentally on CT imaging.  He underwent a robotic assisted left adrenalectomy in 2017 by Dr. Brendia Sacks at [facility].  He presents today with worsening of his lower urinary tract symptoms over the past 3 months.  He reports a slowing of his urinary stream, urinary frequency, nocturia 2-3 times, urgency.  No dysuria.  Reports a possible episode of painless gross hematuria approximately 6 weeks ago.  He was diagnosed with a UTI and treated with antibiotics by his PCP.  He has been on Flomax 0.4 mg daily for several years. AUA score = 13 today.  Allergies  Allergen Reactions   Red Dye Nausea And Vomiting   Gadolinium Nausea And Vomiting     Code: VOM, with injection, no other reactions noted, Onset Date: [id number]      Past Medical History:  Diagnosis Date   Anxiety    Bladder cancer ([place])    Cancer ([place])    Depression    Hemorrhoids    Hypertension      Past Surgical History:  Procedure Laterality Date   CHOLECYSTECTOMY  2001   COLONOSCOPY  11/09/2011   Dr. Oneida Alar: two tubular adenomas, mild diverticulosis, internal hemorrhoids, surveillance in 10 years     CYST EXCISION  1970's   back   ESOPHAGOGASTRODUODENOSCOPY  10/07/2012   Dr. Arnoldo Morale: normal, Clotest negative   [place] Right 11/04/2013   Procedure: RECURRENT RIGHT INGUINAL HERNIA REPAIR;  Surgeon: Jamesetta So, MD;  Location: [facility];  Service: General;  Laterality: Right;   Left shoulder surgery  2011   LESION REMOVAL N/A 11/04/2013   Procedure: EXCISION OF SKIN LESION ABDOMINAL WALL;  Surgeon: Jamesetta So, MD;  Location: [facility];  Service: General;  Laterality: N/A;     Social History   Tobacco Use   Smoking status: Never   Smokeless tobacco: Never  Substance Use Topics   Alcohol use: Yes    Comment: 2 oz wine M-F   Drug use: No    ROS:  Constitutional:  Negative for fever, chills, weight loss CV: Negative for chest pain, previous MI, hypertension Respiratory:  Negative for shortness of breath, wheezing, sleep apnea, frequent cough GI:  Negative for nausea, vomiting, bloody stool, GERD  PE: BP (!) 165/82   Pulse 80   Ht '6\' 1"'$  (1.854 m)   Wt 76.7 kg   BMI 22.30 kg/m  GENERAL APPEARANCE:  Well appearing, well developed, well nourished, NAD HEENT:  Atraumatic, normocephalic, oropharynx clear NECK:  Supple without lymphadenopathy or thyromegaly ABDOMEN:  Soft, non-tender, no  masses EXTREMITIES:  Moves all extremities well, without clubbing, cyanosis, or edema NEUROLOGIC:  Alert and oriented x 3, normal gait, CN II-XII grossly intact MENTAL STATUS:  appropriate BACK:  Non-tender to palpation, No CVAT SKIN:  Warm, dry, and intact GU: Penis:  circumcised Meatus:  no significant stenosis Scrotum: normal, no masses Testis: normal without masses  Epididymis: normal Prostate: 40 g, NT, no nodules Rectum: Normal tone, normal prostate, no masses or tenderness    Results: Results for orders placed or performed in visit on 06/19/21 (from the past 24 hour(s))  BLADDER SCAN AMB NON-IMAGING   Collection Time: 06/19/21 11:04 AM   Result Value Ref Range   Scan Result 200   Microscopic Examination   Collection Time: 06/19/21 11:18 AM   Urine  Result Value Ref Range   WBC, UA 0-5 0 - 5 /hpf   RBC 0-2 0 - 2 /hpf   Epithelial Cells (non renal) 0-10 0 - 10 /hpf   Renal Epithel, UA None seen None seen /hpf   Mucus, UA Present Not Estab.   Bacteria, UA None seen None seen/Few  Urinalysis, Routine w reflex microscopic   Collection Time: 06/19/21 11:18 AM  Result Value Ref Range   Specific Gravity, UA 1.020 1.005 - 1.030   pH, UA 6.0 5.0 - 7.5   Color, UA Amber (A) Yellow   Appearance Ur Clear Clear   Leukocytes,UA Trace (A) Negative   Protein,UA Trace (A) Negative/Trace   Glucose, UA Negative Negative   Ketones, UA Negative Negative   RBC, UA Trace (A) Negative   Bilirubin, UA Negative Negative   Urobilinogen, Ur 0.2 0.2 - 1.0 mg/dL   Nitrite, UA Negative Negative   Microscopic Examination See below:

## 2021-06-19 NOTE — Progress Notes (Signed)
Urological Symptom Review  Patient is experiencing the following symptoms: Frequent urination Hard to postpone urination Get up at night to urinate Urinary tract infection Weak stream Erection problems (male only)   Review of Systems  Gastrointestinal (upper)  : Negative for upper GI symptoms  Gastrointestinal (lower) : Negative for lower GI symptoms  Constitutional : Negative for symptoms  Skin: Negative for skin symptoms  Eyes: Negative for eye symptoms  Ear/Nose/Throat : Negative for Ear/Nose/Throat symptoms  Hematologic/Lymphatic: Negative for Hematologic/Lymphatic symptoms  Cardiovascular : Negative for cardiovascular symptoms  Respiratory : Negative for respiratory symptoms  Endocrine: Negative for endocrine symptoms  Musculoskeletal: Negative for musculoskeletal symptoms  Neurological: Negative for neurological symptoms  Psychologic: Negative for psychiatric symptoms

## 2021-06-19 NOTE — Progress Notes (Signed)
post void residual= 200 

## 2021-06-20 ENCOUNTER — Encounter: Payer: Self-pay | Admitting: Urology

## 2021-06-20 DIAGNOSIS — Z8551 Personal history of malignant neoplasm of bladder: Secondary | ICD-10-CM | POA: Insufficient documentation

## 2021-06-20 DIAGNOSIS — N529 Male erectile dysfunction, unspecified: Secondary | ICD-10-CM | POA: Insufficient documentation

## 2021-06-20 DIAGNOSIS — N138 Other obstructive and reflux uropathy: Secondary | ICD-10-CM | POA: Insufficient documentation

## 2021-06-20 LAB — PSA: Prostate Specific Ag, Serum: 2.6 ng/mL (ref 0.0–4.0)

## 2021-06-20 NOTE — Progress Notes (Signed)
Sent via mail 

## 2021-06-23 DIAGNOSIS — E1065 Type 1 diabetes mellitus with hyperglycemia: Secondary | ICD-10-CM | POA: Diagnosis not present

## 2021-06-23 DIAGNOSIS — E10649 Type 1 diabetes mellitus with hypoglycemia without coma: Secondary | ICD-10-CM | POA: Diagnosis not present

## 2021-07-07 ENCOUNTER — Other Ambulatory Visit: Payer: Self-pay

## 2021-07-07 ENCOUNTER — Emergency Department (HOSPITAL_COMMUNITY)
Admission: EM | Admit: 2021-07-07 | Discharge: 2021-07-07 | Disposition: A | Discharge status: Home / Self Care | Payer: Medicare Other | Attending: Emergency Medicine | Admitting: Emergency Medicine

## 2021-07-07 ENCOUNTER — Encounter (HOSPITAL_COMMUNITY): Payer: Self-pay | Admitting: *Deleted

## 2021-07-07 ENCOUNTER — Emergency Department (HOSPITAL_COMMUNITY): Payer: Medicare Other

## 2021-07-07 DIAGNOSIS — Z8551 Personal history of malignant neoplasm of bladder: Secondary | ICD-10-CM | POA: Diagnosis not present

## 2021-07-07 DIAGNOSIS — R319 Hematuria, unspecified: Secondary | ICD-10-CM | POA: Diagnosis not present

## 2021-07-07 DIAGNOSIS — I10 Essential (primary) hypertension: Secondary | ICD-10-CM | POA: Diagnosis not present

## 2021-07-07 DIAGNOSIS — R350 Frequency of micturition: Secondary | ICD-10-CM | POA: Insufficient documentation

## 2021-07-07 DIAGNOSIS — N210 Calculus in bladder: Secondary | ICD-10-CM | POA: Diagnosis not present

## 2021-07-07 LAB — CBC WITH DIFFERENTIAL/PLATELET
Abs Immature Granulocytes: 0.04 10*3/uL (ref 0.00–0.07)
Basophils Absolute: 0 10*3/uL (ref 0.0–0.1)
Basophils Relative: 1 %
Eosinophils Absolute: 0.1 10*3/uL (ref 0.0–0.5)
Eosinophils Relative: 2 %
HCT: 50.2 % (ref 39.0–52.0)
Hemoglobin: 16.8 g/dL (ref 13.0–17.0)
Immature Granulocytes: 1 %
Lymphocytes Relative: 20 %
Lymphs Abs: 1.3 10*3/uL (ref 0.7–4.0)
MCH: 32.1 pg (ref 26.0–34.0)
MCHC: 33.5 g/dL (ref 30.0–36.0)
MCV: 96 fL (ref 80.0–100.0)
Monocytes Absolute: 1 10*3/uL (ref 0.1–1.0)
Monocytes Relative: 14 %
Neutro Abs: 4.1 10*3/uL (ref 1.7–7.7)
Neutrophils Relative %: 62 %
Platelets: 247 10*3/uL (ref 150–400)
RBC: 5.23 MIL/uL (ref 4.22–5.81)
RDW: 13 % (ref 11.5–15.5)
WBC: 6.6 10*3/uL (ref 4.0–10.5)
nRBC: 0 % (ref 0.0–0.2)

## 2021-07-07 LAB — URINALYSIS, ROUTINE W REFLEX MICROSCOPIC
Bilirubin Urine: NEGATIVE
Glucose, UA: NEGATIVE mg/dL
Ketones, ur: NEGATIVE mg/dL
Nitrite: NEGATIVE
Protein, ur: NEGATIVE mg/dL
Specific Gravity, Urine: 1.002 — ABNORMAL LOW (ref 1.005–1.030)
pH: 7 (ref 5.0–8.0)

## 2021-07-07 LAB — COMPREHENSIVE METABOLIC PANEL
ALT: 17 U/L (ref 0–44)
AST: 16 U/L (ref 15–41)
Albumin: 4.2 g/dL (ref 3.5–5.0)
Alkaline Phosphatase: 58 U/L (ref 38–126)
Anion gap: 6 (ref 5–15)
BUN: 24 mg/dL — ABNORMAL HIGH (ref 8–23)
CO2: 26 mmol/L (ref 22–32)
Calcium: 9.1 mg/dL (ref 8.9–10.3)
Chloride: 105 mmol/L (ref 98–111)
Creatinine, Ser: 1.11 mg/dL (ref 0.61–1.24)
GFR, Estimated: >60 mL/min (ref >60)
Glucose, Bld: 98 mg/dL (ref 70–99)
Potassium: 4.2 mmol/L (ref 3.5–5.1)
Sodium: 137 mmol/L (ref 135–145)
Total Bilirubin: 1.2 mg/dL (ref 0.3–1.2)
Total Protein: 7.3 g/dL (ref 6.5–8.1)

## 2021-07-07 LAB — CK: Total CK: 83 U/L (ref 49–397)

## 2021-07-07 MED ORDER — SULFAMETHOXAZOLE-TRIMETHOPRIM 800-160 MG PO TABS: 1.0000 | ORAL_TABLET | Freq: Two times a day (BID) | ORAL | 0 refills | Status: AC

## 2021-07-07 NOTE — ED Provider Notes (Signed)
Emergency Medicine Provider Triage Evaluation Note  Chad Butler , a 70 y.o. male  was evaluated in triage.  Pt complains of hematuria and frequency.  Review of Systems  Positive: Hematuria Negative: Fever back pain  Physical Exam  BP (!) 149/86 (BP Location: Right Arm)   Pulse 74   Temp 97.8 F (36.6 C) (Oral)   Resp 14   Ht 6' (1.829 m)   Wt 74.8 kg   SpO2 99%   BMI 22.38 kg/m  Gen:   Awake, no distress   Resp:  Normal effort  MSK:   Moves extremities without difficulty  Other:  Abdomen soft nontender  Medical Decision Making  Medically screening exam initiated at 10:48 AM.  Appropriate orders placed.  Lauris Chroman was informed that the remainder of the evaluation will be completed by another provider, this initial triage assessment does not replace that evaluation, and the importance of remaining in the ED until their evaluation is complete.     Hayden Rasmussen, MD 07/07/21 1739

## 2021-07-07 NOTE — ED Notes (Signed)
Pt ambulatory to waiting room. Pt verbalized understanding of discharge instructions.   

## 2021-07-07 NOTE — Discharge Instructions (Addendum)
You are seen in the emergency department for blood in your urine.  You had blood work and urinalysis that did not show any significant findings other than some blood in your urine.  You had a CAT scan that showed some kidney stones in your bladder which means he may have recently passed a kidney stone.  We are starting you on some antibiotics and have sent a urine culture.  Please drink plenty of fluids.  Contact your primary care doctor and urologist for close follow-up.  Return to the emergency department if any difficulty urinating or high fever.

## 2021-07-07 NOTE — ED Triage Notes (Addendum)
Pt c/o hematuria and urinary frequency when he woke up this morning. Denies dysuria. Pt had similar symptoms beginning of August and was diagnosed with a UTI. Denies fever.

## 2021-07-07 NOTE — ED Provider Notes (Signed)
[facility] EMERGENCY DEPARTMENT Provider Note   CSN: [id number] Arrival date & time: 07/07/21  0957     History Chief Complaint  Patient presents with   Hematuria    Chad Butler is a 70 y.o. male.  He has a history of bladder cancer, he is complaining of hematuria and frequency along with sense of incomplete bladder emptying that started this morning.  No back pain or abdominal pain.  No fevers or chills.  He had similar symptoms in August and was treated with antibiotics.  He says this bleeding is heavier.  The history is provided by the patient.  Hematuria This is a recurrent problem. The current episode started 3 to 5 hours ago. The problem occurs constantly. The problem has not changed since onset.Pertinent negatives include no chest pain, no abdominal pain, no headaches and no shortness of breath. Nothing aggravates the symptoms. Nothing relieves the symptoms. He has tried nothing for the symptoms. The treatment provided no relief.      Past Medical History:  Diagnosis Date   Anxiety    Bladder cancer ([place])    Cancer ([place])    Depression    Hemorrhoids    Hypertension     Patient Active Problem List   Diagnosis Date Noted   BPH with obstruction/lower urinary tract symptoms 06/20/2021   History of bladder cancer 06/20/2021   Organic impotence 06/20/2021   Hemorrhoids 04/21/2018   GERD (gastroesophageal reflux disease) 04/21/2018   Hemorrhoids 04/21/2018   Cough 10/21/2017   Chronic RLQ pain 10/21/2017   Vomiting 03/08/2017    Past Surgical History:  Procedure Laterality Date   ADRENALECTOMY     [facility]   CHOLECYSTECTOMY  2001   COLONOSCOPY  11/09/2011   Dr. Oneida Alar: two tubular adenomas, mild diverticulosis, internal hemorrhoids, surveillance in 10 years    CYST EXCISION  1970's   back   ESOPHAGOGASTRODUODENOSCOPY  10/07/2012   Dr. Arnoldo Morale: normal, Clotest negative   [place] Right 11/04/2013   Procedure: RECURRENT  RIGHT INGUINAL HERNIA REPAIR;  Surgeon: Jamesetta So, MD;  Location: [facility];  Service: General;  Laterality: Right;   Left shoulder surgery  2011   LESION REMOVAL N/A 11/04/2013   Procedure: EXCISION OF SKIN LESION ABDOMINAL WALL;  Surgeon: Jamesetta So, MD;  Location: [facility];  Service: General;  Laterality: N/A;       Family History  Problem Relation Age of Onset   Hypertension Mother    Cancer Father    Colon cancer Neg Hx    Diabetes Neg Hx    Thyroid disease Neg Hx     Social History   Tobacco Use   Smoking status: Never   Smokeless tobacco: Never  Vaping Use   Vaping Use: Never used  Substance Use Topics   Alcohol use: Yes    Comment: 2 oz wine M-F   Drug use: No    Home Medications Prior to Admission medications   Medication Sig Start Date End Date Taking? Authorizing Provider  ALPRAZolam Duanne Moron) 0.5 MG tablet Take 0.5 mg by mouth at bedtime. For sleep.    [provider]  Multiple Vitamin (MULITIVITAMIN WITH MINERALS) TABS Take 1 tablet by mouth every morning.     [provider]  omeprazole (PRILOSEC) 20 MG capsule Take 20 mg by mouth daily.    [provider]  sildenafil (VIAGRA) 100 MG tablet Take 1 tablet (100 mg total) by mouth daily as  needed for erectile dysfunction. OK to start w/ 1/2 tab as needed Patient not taking: Reported on 06/19/2021 11/01/20   Franchot Gallo, MD  silodosin (RAPAFLO) 8 MG CAPS capsule Take 1 capsule (8 mg total) by mouth daily with breakfast. 06/19/21   Stoneking, Reece Leader., MD  pantoprazole (PROTONIX) 40 MG tablet Take 1 tablet (40 mg total) by mouth daily. Take 30 minutes before breakfast. 03/08/17 02/22/20  Annitta Needs, NP    Allergies    Red dye and Gadolinium  Review of Systems   Review of Systems  Constitutional:  Negative for fever.  HENT:  Negative for sore throat.   Eyes:  Negative for visual disturbance.  Respiratory:  Negative for shortness of breath.   Cardiovascular:  Negative for  chest pain.  Gastrointestinal:  Negative for abdominal pain.  Genitourinary:  Positive for frequency and hematuria. Negative for dysuria.  Musculoskeletal:  Negative for back pain.  Skin:  Negative for rash.  Neurological:  Negative for headaches.   Physical Exam Updated Vital Signs BP (!) 149/86 (BP Location: Right Arm)   Pulse 74   Temp 97.8 F (36.6 C) (Oral)   Resp 14   Ht 6' (1.829 m)   Wt 74.8 kg   SpO2 99%   BMI 22.38 kg/m   Physical Exam Vitals and nursing note reviewed.  Constitutional:      Appearance: Normal appearance. He is well-developed.  HENT:     Head: Normocephalic and atraumatic.  Eyes:     Conjunctiva/sclera: Conjunctivae normal.  Cardiovascular:     Rate and Rhythm: Normal rate and regular rhythm.     Heart sounds: No murmur heard. Pulmonary:     Effort: Pulmonary effort is normal. No respiratory distress.     Breath sounds: Normal breath sounds.  Abdominal:     Palpations: Abdomen is soft.     Tenderness: There is no abdominal tenderness. There is no guarding or rebound.  Musculoskeletal:        General: No deformity or signs of injury. Normal range of motion.     Cervical back: Neck supple.  Skin:    General: Skin is warm and dry.  Neurological:     General: No focal deficit present.     Mental Status: He is alert.    ED Results / Procedures / Treatments   Labs (all labs ordered are listed, but only abnormal results are displayed) Labs Reviewed  URINALYSIS, ROUTINE W REFLEX MICROSCOPIC - Abnormal; Notable for the following components:      Result Value   Color, Urine STRAW (*)    Specific Gravity, Urine 1.002 (*)    Hgb urine dipstick LARGE (*)    Leukocytes,Ua TRACE (*)    Bacteria, UA RARE (*)    All other components within normal limits  COMPREHENSIVE METABOLIC PANEL - Abnormal; Notable for the following components:   BUN 24 (*)    All other components within normal limits  URINE CULTURE  CBC WITH DIFFERENTIAL/PLATELET  CK     EKG None  Radiology CT Renal Stone Study  Result Date: 07/07/2021 CLINICAL DATA:  Hematuria and urinary frequency. History of bladder cancer. EXAM: CT ABDOMEN AND PELVIS WITHOUT CONTRAST TECHNIQUE: Multidetector CT imaging of the abdomen and pelvis was performed following the standard protocol without IV contrast. COMPARISON:  CT abdomen pelvis 09/13/2016 FINDINGS: Lower chest: No acute abnormality. Hepatobiliary: Calcified granuloma in the right lobe. No focal hepatic lesion. Status post cholecystectomy. No biliary dilatation. Pancreas: Diffuse severe parenchymal  atrophy and fatty infiltration. The main pancreatic duct is not dilated. No peripancreatic fat stranding or fluid collection. Spleen: Normal size.  Multiple calcified granulomas. Adrenals/Urinary Tract: Right adrenal gland is unremarkable. Resolution of the fluid density at the left adrenal gland with interval development of calcifications, consistent with postsurgical changes. No hydronephrosis. Left prominent extrarenal pelvis with mild dilatation of the left ureter. A 0.2 cm calcified stone is noted in the posterior left aspect of the urinary bladder, just inferior to the left UVJ (series 2, image 67; series 5, image 69). Within the urinary bladder at the right UVJ, there is a 0.3 cm calcified stone (series 2, image 69; series 5, image 65). No perinephric fat stranding or fluid collection bilaterally. Stomach/Bowel: Patulous distal esophagus. Stomach is within normal limits. Appendix appears normal. No evidence of bowel wall thickening, distention, or inflammatory changes. Vascular/Lymphatic: Mild scattered calcific atherosclerosis of the abdominal aorta. No abdominal aortic aneurysm. No enlarged lymph nodes in the abdomen or pelvis. Reproductive: Prostatomegaly with mass effect on the posteroinferior aspect of the urinary bladder. Other: No abdominal wall hernia or abnormality. No abdominopelvic ascites. Musculoskeletal: No acute or  significant osseous findings. IMPRESSION: 1. A 0.3 cm stone is noted within the bladder at the right UVJ. An additional 0.2 cm stone is noted within the bladder, just inferior to the left UVJ. Findings may represent recently passed stones. No additional renal stones or hydronephrosis. 2. No findings of metastatic disease in the abdomen or pelvis. Electronically Signed   By: Ileana Roup M.D.   On: 07/07/2021 12:24    Procedures Procedures   Medications Ordered in ED Medications - No data to display  ED Course  I have reviewed the triage vital signs and the nursing notes.  Pertinent labs & imaging results that were available during my care of the patient were reviewed by me and considered in my medical decision making (see chart for details).  Clinical Course as of 07/07/21 1739  Fri Jul 07, 2021  1118 Patient said he had a history of bladder cancer remotely and had surgery for that by Dr. Estill Cotta in [place].  He did not require any chemotherapy or radiation.  He has followed up with Dr. Diona [facility]anti [facility].  [MB]    Clinical Course User Index [MB] Hayden Rasmussen, MD   MDM Rules/Calculators/A&P                           This patient complains of hematuria; this involves an extensive number of treatment Options and is a complaint that carries with it a high risk of complications and Morbidity. The differential includes infection, stone, bladder cancer, rhabdo  I ordered, reviewed and interpreted labs, which included CBC with normal white count normal hemoglobin, chemistries and LFTs normal, urinalysis with minimal signs of bleeding I ordered imaging studies which included CT renal and I independently    visualized and interpreted imaging which showed 2 stones in the bladder question whether recently passed Previous records obtained and reviewed in epic no recent admissions  After the interventions stated above, I reevaluated the patient and found patient to be pain-free and  hemodynamically stable.  Reviewed results of work-up with him.  Will cover with antibiotics and recommended close follow-up with PCP and urology.  Return instructions discussed.  Reviewed prior urine culture results and Bactrim would be appropriate, was resistant to Keflex on prior culture  Final Clinical Impression(s) / ED Diagnoses Final diagnoses:  Hematuria, unspecified type    Rx / DC Orders ED Discharge Orders          Ordered    sulfamethoxazole-trimethoprim (BACTRIM DS) 800-160 MG tablet  2 times daily        07/07/21 1130             Hayden Rasmussen, MD 07/07/21 1742

## 2021-07-08 LAB — URINE CULTURE: Culture: NO GROWTH

## 2021-07-14 DIAGNOSIS — Z23 Encounter for immunization: Secondary | ICD-10-CM | POA: Diagnosis not present

## 2021-07-18 ENCOUNTER — Ambulatory Visit (INDEPENDENT_AMBULATORY_CARE_PROVIDER_SITE_OTHER): Payer: Medicare Other

## 2021-07-18 ENCOUNTER — Other Ambulatory Visit: Payer: Self-pay

## 2021-07-18 DIAGNOSIS — R350 Frequency of micturition: Secondary | ICD-10-CM

## 2021-07-18 DIAGNOSIS — R339 Retention of urine, unspecified: Secondary | ICD-10-CM

## 2021-07-18 LAB — URINALYSIS, ROUTINE W REFLEX MICROSCOPIC
Bilirubin, UA: NEGATIVE
Ketones, UA: NEGATIVE
Nitrite, UA: NEGATIVE
Specific Gravity, UA: >1.03 — ABNORMAL HIGH (ref 1.005–1.030)
Urobilinogen, Ur: 0.2 mg/dL (ref 0.2–1.0)
pH, UA: 5.5 (ref 5.0–7.5)

## 2021-07-18 NOTE — Progress Notes (Signed)
Patient came into office complaining of frequency, hematuria, and feeling of not emptying his bladder.  Per Dr. Alyson Ingles ok to do urine drop off and pvr.  post void residual=144

## 2021-07-20 LAB — URINE CULTURE

## 2021-07-25 ENCOUNTER — Telehealth: Payer: Self-pay

## 2021-07-25 NOTE — Telephone Encounter (Signed)
Called and lvm for pt call us back regard lab results.

## 2021-07-25 NOTE — Telephone Encounter (Signed)
Made pt aware of his  urine culture results, pt understood w/o any concerns.

## 2021-07-25 NOTE — Telephone Encounter (Signed)
-----   Message from Franchot Gallo, MD sent at 07/24/2021  9:21 AM EDT ----- Let pt know that urine culture was negative--no abx needed--will see him @ scheduled appt in Nov ----- Message ----- From: Mardelle Matte, CMA Sent: 07/20/2021   9:23 AM EDT To: Franchot Gallo, MD  Please review

## 2021-07-31 DIAGNOSIS — H2513 Age-related nuclear cataract, bilateral: Secondary | ICD-10-CM | POA: Diagnosis not present

## 2021-07-31 NOTE — H&P (View-Only) (Signed)
History of Present Illness:   Urgent work in for patient with recurrent gross hematuria.  This started a couple of days ago.  He does have a history of urothelial carcinoma the bladder, had TURBT in 2017.  Cystoscopy here for routine surveillance in February 2022 was negative.  Recently seen in September by Dr. Felipa Eth.  Put on silodosin for slower urinary stream.  He still states he voids frequently.  Past Medical History:  Diagnosis Date   Anxiety    Bladder cancer ([place])    Cancer ([place])    Depression    Hemorrhoids    Hypertension     Past Surgical History:  Procedure Laterality Date   ADRENALECTOMY     [facility]   CHOLECYSTECTOMY  2001   COLONOSCOPY  11/09/2011   Dr. Oneida Alar: two tubular adenomas, mild diverticulosis, internal hemorrhoids, surveillance in 10 years    CYST EXCISION  1970's   back   ESOPHAGOGASTRODUODENOSCOPY  10/07/2012   Dr. Arnoldo Morale: normal, Clotest negative   [place] Right 11/04/2013   Procedure: RECURRENT RIGHT INGUINAL HERNIA REPAIR;  Surgeon: Jamesetta So, MD;  Location: [facility];  Service: General;  Laterality: Right;   Left shoulder surgery  2011   LESION REMOVAL N/A 11/04/2013   Procedure: EXCISION OF SKIN LESION ABDOMINAL WALL;  Surgeon: Jamesetta So, MD;  Location: [facility];  Service: General;  Laterality: N/A;    Home Medications:  Allergies as of 08/01/2021       Reactions   Red Dye Nausea And Vomiting   Gadolinium Nausea And Vomiting    Code: VOM, with injection, no other reactions noted, Onset Date: [id number]        Medication List        Accurate as of July 31, 2021  7:41 PM. If you have any questions, ask your nurse or doctor.          ALPRAZolam 0.5 MG tablet Commonly known as: XANAX Take 0.5 mg by mouth at bedtime. For sleep.   omeprazole 20 MG capsule Commonly known as: PRILOSEC Take 20 mg by mouth daily.   sildenafil 100 MG tablet Commonly known as: VIAGRA Take 1 tablet  (100 mg total) by mouth daily as needed for erectile dysfunction. OK to start w/ 1/2 tab as needed   silodosin 8 MG Caps capsule Commonly known as: RAPAFLO Take 1 capsule (8 mg total) by mouth daily with breakfast.        Allergies:  Allergies  Allergen Reactions   Red Dye Nausea And Vomiting   Gadolinium Nausea And Vomiting     Code: VOM, with injection, no other reactions noted, Onset Date: [id number]     Family History  Problem Relation Age of Onset   Hypertension Mother    Cancer Father    Colon cancer Neg Hx    Diabetes Neg Hx    Thyroid disease Neg Hx     Social History:  reports that he has never smoked. He has never used smokeless tobacco. He reports current alcohol use. He reports that he does not use drugs.  ROS: A complete review of systems was performed.  All systems are negative except for pertinent findings as noted.  Physical Exam:  Vital signs in last 24 hours: There were no vitals taken for this visit. Constitutional:  Alert and oriented, No acute distress Cardiovascular: Regular rate  Respiratory: Normal respiratory effort Neurologic: Grossly intact, no focal deficits Psychiatric: Normal mood  and affect  I have reviewed prior pt notes  I have reviewed notes from referring/previous physicians  I have reviewed urinalysis results  I have reviewed prior PSA results  Cystoscopy Procedure Note:  Indication: Gross hematuria  After informed consent and discussion of the procedure and its risks, Chad Butler was positioned and prepped in the standard fashion.  Cystoscopy was performed with a flexible cystoscope.   Findings: Urethra:Normal Prostate:Mildly obstructive--bilabar hypertrophy Bladder neck:Nml Ureteral orifices:Nml bilaterally Bladder:Trabeculated. ? 2 areas, one each on Rt and Lt  lateral walls--low lying papillary lesions ~ 15 mm in size.  The patient tolerated the procedure well.      Impression/Assessment:  Probable  recurrent TCCa bladder  Plan:  1.  Cystoscopy was covered with Cipro today  2.  I discussed cystoscopy, retrograde pyelograms, TURBT and gemcitabine with the patient.  I feel that this is necessary.  We discussed the outpatient nature of this procedure as well as him going home with a catheter for at least overnight.  He understands this.  3.  Dr. Felipa Eth, who saw him 2 months ago, has agreed to have this procedure put on his schedule to be done at [facility].

## 2021-07-31 NOTE — Progress Notes (Signed)
History of Present Illness:   Urgent work in for patient with recurrent gross hematuria.  This started a couple of days ago.  He does have a history of urothelial carcinoma the bladder, had TURBT in 2017.  Cystoscopy here for routine surveillance in February 2022 was negative.  Recently seen in September by Dr. Felipa Eth.  Put on silodosin for slower urinary stream.  He still states he voids frequently.  Past Medical History:  Diagnosis Date   Anxiety    Bladder cancer ([place])    Cancer ([place])    Depression    Hemorrhoids    Hypertension     Past Surgical History:  Procedure Laterality Date   ADRENALECTOMY     [facility]   CHOLECYSTECTOMY  2001   COLONOSCOPY  11/09/2011   Dr. Oneida Alar: two tubular adenomas, mild diverticulosis, internal hemorrhoids, surveillance in 10 years    CYST EXCISION  1970's   back   ESOPHAGOGASTRODUODENOSCOPY  10/07/2012   Dr. Arnoldo Morale: normal, Clotest negative   Kevin Right 11/04/2013   Procedure: RECURRENT RIGHT INGUINAL HERNIA REPAIR;  Surgeon: Jamesetta So, MD;  Location: [facility];  Service: General;  Laterality: Right;   Left shoulder surgery  2011   LESION REMOVAL N/A 11/04/2013   Procedure: EXCISION OF SKIN LESION ABDOMINAL WALL;  Surgeon: Jamesetta So, MD;  Location: [facility];  Service: General;  Laterality: N/A;    Home Medications:  Allergies as of 08/01/2021       Reactions   Red Dye Nausea And Vomiting   Gadolinium Nausea And Vomiting    Code: VOM, with injection, no other reactions noted, Onset Date: [id number]        Medication List        Accurate as of July 31, 2021  7:41 PM. If you have any questions, ask your nurse or doctor.          ALPRAZolam 0.5 MG tablet Commonly known as: XANAX Take 0.5 mg by mouth at bedtime. For sleep.   omeprazole 20 MG capsule Commonly known as: PRILOSEC Take 20 mg by mouth daily.   sildenafil 100 MG tablet Commonly known as: VIAGRA Take 1 tablet  (100 mg total) by mouth daily as needed for erectile dysfunction. OK to start w/ 1/2 tab as needed   silodosin 8 MG Caps capsule Commonly known as: RAPAFLO Take 1 capsule (8 mg total) by mouth daily with breakfast.        Allergies:  Allergies  Allergen Reactions   Red Dye Nausea And Vomiting   Gadolinium Nausea And Vomiting     Code: VOM, with injection, no other reactions noted, Onset Date: [id number]     Family History  Problem Relation Age of Onset   Hypertension Mother    Cancer Father    Colon cancer Neg Hx    Diabetes Neg Hx    Thyroid disease Neg Hx     Social History:  reports that he has never smoked. He has never used smokeless tobacco. He reports current alcohol use. He reports that he does not use drugs.  ROS: A complete review of systems was performed.  All systems are negative except for pertinent findings as noted.  Physical Exam:  Vital signs in last 24 hours: There were no vitals taken for this visit. Constitutional:  Alert and oriented, No acute distress Cardiovascular: Regular rate  Respiratory: Normal respiratory effort Neurologic: Grossly intact, no focal deficits Psychiatric: Normal mood  and affect  I have reviewed prior pt notes  I have reviewed notes from referring/previous physicians  I have reviewed urinalysis results  I have reviewed prior PSA results  Cystoscopy Procedure Note:  Indication: Gross hematuria  After informed consent and discussion of the procedure and its risks, HORALD BIRKY was positioned and prepped in the standard fashion.  Cystoscopy was performed with a flexible cystoscope.   Findings: Urethra:Normal Prostate:Mildly obstructive--bilabar hypertrophy Bladder neck:Nml Ureteral orifices:Nml bilaterally Bladder:Trabeculated. ? 2 areas, one each on Rt and Lt  lateral walls--low lying papillary lesions ~ 15 mm in size.  The patient tolerated the procedure well.      Impression/Assessment:  Probable  recurrent TCCa bladder  Plan:  1.  Cystoscopy was covered with Cipro today  2.  I discussed cystoscopy, retrograde pyelograms, TURBT and gemcitabine with the patient.  I feel that this is necessary.  We discussed the outpatient nature of this procedure as well as him going home with a catheter for at least overnight.  He understands this.  3.  Dr. Felipa Eth, who saw him 2 months ago, has agreed to have this procedure put on his schedule to be done at [facility].

## 2021-08-01 ENCOUNTER — Encounter: Payer: Self-pay | Admitting: Urology

## 2021-08-01 ENCOUNTER — Ambulatory Visit (INDEPENDENT_AMBULATORY_CARE_PROVIDER_SITE_OTHER): Payer: Medicare Other | Admitting: Urology

## 2021-08-01 ENCOUNTER — Other Ambulatory Visit: Payer: Self-pay

## 2021-08-01 VITALS — BP 165/83 | HR 80

## 2021-08-01 DIAGNOSIS — Z8551 Personal history of malignant neoplasm of bladder: Secondary | ICD-10-CM | POA: Diagnosis not present

## 2021-08-01 DIAGNOSIS — R310 Gross hematuria: Secondary | ICD-10-CM | POA: Diagnosis not present

## 2021-08-01 DIAGNOSIS — R350 Frequency of micturition: Secondary | ICD-10-CM

## 2021-08-01 LAB — URINALYSIS, ROUTINE W REFLEX MICROSCOPIC
Bilirubin, UA: NEGATIVE
Glucose, UA: NEGATIVE
Nitrite, UA: NEGATIVE
Specific Gravity, UA: 1.025 (ref 1.005–1.030)
Urobilinogen, Ur: 0.2 mg/dL (ref 0.2–1.0)
pH, UA: 6 (ref 5.0–7.5)

## 2021-08-01 LAB — MICROSCOPIC EXAMINATION
Bacteria, UA: NONE SEEN
RBC, Urine: >30 /hpf — AB (ref 0–2)
Renal Epithel, UA: NONE SEEN /hpf

## 2021-08-01 MED ORDER — CIPROFLOXACIN HCL 500 MG PO TABS
500.0000 mg | ORAL_TABLET | Freq: Once | ORAL | Status: AC
  Administered 2021-08-01: 500 mg via ORAL

## 2021-08-01 NOTE — Addendum Note (Signed)
Addended by: Dorisann Frames on: 08/01/2021 02:34 PM   Modules accepted: Orders

## 2021-08-01 NOTE — Progress Notes (Signed)
Urological Symptom Review  Patient is experiencing the following symptoms: Frequent urination Get up at night to urinate Blood in urine Urinary tract infection Weak stream Erection problems (male only)   Review of Systems  Gastrointestinal (upper)  : Negative for upper GI symptoms  Gastrointestinal (lower) : Negative for lower GI symptoms  Constitutional : Negative for symptoms  Skin: Negative for skin symptoms  Eyes: Negative for eye symptoms  Ear/Nose/Throat : Negative for Ear/Nose/Throat symptoms  Hematologic/Lymphatic: Negative for Hematologic/Lymphatic symptoms  Cardiovascular : Negative for cardiovascular symptoms  Respiratory : Negative for respiratory symptoms  Endocrine: Negative for endocrine symptoms  Musculoskeletal: Negative for musculoskeletal symptoms  Neurological: Negative for neurological symptoms  Psychologic: Negative for psychiatric symptoms

## 2021-08-15 NOTE — Patient Instructions (Signed)
[facility]ad [facility]  08/15/2021     @PREFPERIOPPHARMACY @   Your procedure is scheduled on 08/17/2021.  Report to [facility] at 10:30 A.M.  Call this number if you have problems the morning of surgery:  [phone]   Remember:  Do not eat or drink after midnight.    Take these medicines the morning of surgery with A SIP OF WATER : Xanax, Prilosec    Do not wear jewelry, make-up or nail polish.  Do not wear lotions, powders, or perfumes, or deodorant.  Do not shave 48 hours prior to surgery.  Men may shave face and neck.  Do not bring valuables to the hospital.  [facility] is not responsible for any belongings or valuables.  Contacts, dentures or bridgework may not be worn into surgery.  Leave your suitcase in the car.  After surgery it may be brought to your room.  For patients admitted to the hospital, discharge time will be determined by your treatment team.  Patients discharged the day of surgery will not be allowed to drive home.   Name and phone number of your driver:   family Special instructions:  n/a  Please read over the following fact sheets that you were given. Care and Recovery After Surgery  Transurethral Resection of Bladder Tumor Transurethral resection of a bladder tumor is the removal (resection) of a cancerous growth (tumor) on the inside wall of the bladder. The bladder is the organ that holds urine. The tumor is removed through the tube that carries urine out of the body (urethra). In a transurethral resection, a thin telescope with a light, a tiny camera, and an electric cutting edge (resectoscope) is passed through the urethra. In men, the opening of the urethra is at the end of the penis. In women, it is just above the opening of the vagina. Tell a health care provider about: Any allergies you have. All medicines you are taking, including vitamins, herbs, eye drops, creams, and over-the-counter medicines. Any problems you or family members have had with  anesthetic medicines. Any blood disorders you have. Any surgeries you have had. Any medical conditions you have. Any recent urinary tract infections you have had. Whether you are pregnant or may be pregnant. What are the risks? Generally, this is a safe procedure. However, problems may occur, including: Infection. Bleeding. Allergic reactions to medicines. Damage to nearby structures or organs, such as: The urethra. The tubes that drain urine from the kidneys into the bladder (ureters). Pain and burning during urination. Difficulty urinating due to partial blockage of the urethra. Inability to urinate (urinary retention). What happens before the procedure? Staying hydrated Follow instructions from your health care provider about hydration, which may include: Up to 2 hours before the procedure - you may continue to drink clear liquids, such as water, clear fruit juice, black coffee, and plain tea.  Eating and drinking restrictions Follow instructions from your health care provider about eating and drinking, which may include: 8 hours before the procedure - stop eating heavy meals or foods, such as meat, fried foods, or fatty foods. 6 hours before the procedure - stop eating light meals or foods, such as toast or cereal. 6 hours before the procedure - stop drinking milk or drinks that contain milk. 2 hours before the procedure - stop drinking clear liquids. Medicines Ask your health care provider about: Changing or stopping your regular medicines. This is especially important if you are taking diabetes medicines or blood thinners. Taking medicines  such as aspirin and ibuprofen. These medicines can thin your blood. Do not take these medicines unless your health care provider tells you to take them. Taking over-the-counter medicines, vitamins, herbs, and supplements. Tests You may have exams or tests, including: Physical exam. Blood tests. Urine tests. Electrocardiogram (ECG). This  test measures the electrical activity of the heart. General instructions Plan to have someone take you home from the hospital or clinic. Ask your health care provider how your surgical site will be marked or identified. Ask your health care provider what steps will be taken to help prevent infection. These may include: Washing skin with a germ-killing soap. Taking antibiotic medicine. What happens during the procedure? An IV will be inserted into one of your veins. You will be given one or more of the following: A medicine to help you relax (sedative). A medicine to make you fall asleep (general anesthetic). A medicine that is injected into your spine to numb the area below and slightly above the injection site (spinal anesthetic). Your legs will be placed in foot rests (stirrups) so that your legs are apart and your knees are bent. The resectoscope will be passed through your urethra and into your bladder. The part of your bladder that is affected by the tumor will be resected using the cutting edge of the resectoscope. The resectoscope will be removed. A thin, flexible tube (catheter) will be passed through your urethra and into your bladder. The catheter will drain urine into a bag outside of your body. Fluid may be passed through the catheter to keep the catheter open. The procedure may vary among health care providers and hospitals. What happens after the procedure? Your blood pressure, heart rate, breathing rate, and blood oxygen level will be monitored until you leave the hospital or clinic. You may continue to receive fluids and medicines through an IV. You will have some pain. You will be given pain medicine to relieve pain. You will have a catheter to drain your urine. You will have blood in your urine. Your catheter may be kept in until your urine is clear. The amount of urine will be monitored. If necessary, your bladder may be rinsed out (irrigated) by passing fluid through  your catheter. You will be encouraged to walk around as soon as possible. You may have to wear compression stockings. These stockings help to prevent blood clots and reduce swelling in your legs. Do not drive for 24 hours if you were given a sedative during your procedure. Summary Transurethral resection of a bladder tumor is the removal (resection) of a cancerous growth (tumor) on the inside wall of the bladder. To do this procedure, your health care provider uses a thin telescope with a light, a tiny camera, and an electric cutting edge (resectoscope). Follow your health care provider's instructions. You may need to stop or change certain medicines, and you may be told to stop eating and drinking several hours before the procedure. Your blood pressure, heart rate, breathing rate, and blood oxygen level will be monitored until you leave the hospital or clinic. You may have to wear compression stockings. These stockings help to prevent blood clots and reduce swelling in your legs. This information is not intended to replace advice given to you by your health care provider. Make sure you discuss any questions you have with your health care provider. Document Revised: 04/17/2018 Document Reviewed: 04/18/2018 Elsevier Patient Education  2022 [place]. Cystoscopy Cystoscopy is a procedure that is used to help diagnose  and sometimes treat conditions that affect the lower urinary tract. The lower urinary tract includes the bladder and the urethra. The urethra is the tube that drains urine from the bladder. Cystoscopy is done using a thin, tube-shaped instrument with a light and camera at the end (cystoscope). The cystoscope may be hard or flexible, depending on the goal of the procedure. The cystoscope is inserted through the urethra, into the bladder. Cystoscopy may be recommended if you have: Urinary tract infections that keep coming back. Blood in the urine (hematuria). An inability to control  when you urinate (urinary incontinence) or an overactive bladder. Unusual cells found in a urine sample. A blockage in the urethra, such as a urinary stone. Painful urination. An abnormality in the bladder found during an intravenous pyelogram (IVP) or CT scan. Cystoscopy may also be done to remove a sample of tissue to be examined under a microscope (biopsy). Tell a health care provider about: Any allergies you have. All medicines you are taking, including vitamins, herbs, eye drops, creams, and over-the-counter medicines. Any problems you or family members have had with anesthetic medicines. Any blood disorders you have. Any surgeries you have had. Any medical conditions you have. Whether you are pregnant or may be pregnant. What are the risks? Generally, this is a safe procedure. However, problems may occur, including: Infection. Bleeding. Allergic reactions to medicines. Damage to other structures or organs. What happens before the procedure? Medicines Ask your health care provider about: Changing or stopping your regular medicines. This is especially important if you are taking diabetes medicines or blood thinners. Taking medicines such as aspirin and ibuprofen. These medicines can thin your blood. Do not take these medicines unless your health care provider tells you to take them. Taking over-the-counter medicines, vitamins, herbs, and supplements. Tests You may have an exam or testing, such as: X-rays of the bladder, urethra, or kidneys. CT scan of the abdomen or pelvis. Urine tests to check for signs of infection. General instructions Follow instructions from your health care provider about eating or drinking restrictions. Ask your health care provider what steps will be taken to help prevent infection. These steps may include: Washing skin with a germ-killing soap. Taking antibiotic medicine. Plan to have a responsible adult take you home from the hospital or  clinic. What happens during the procedure?  You will be given one or more of the following: A medicine to help you relax (sedative). A medicine to numb the area (local anesthetic). The area around the opening of your urethra will be cleaned. The cystoscope will be passed through your urethra into your bladder. Germ-free (sterile) fluid will flow through the cystoscope to fill your bladder. The fluid will stretch your bladder so that your health care provider can clearly examine your bladder walls. Your doctor will look at the urethra and bladder. Your doctor may take a biopsy or remove stones. The cystoscope will be removed, and your bladder will be emptied. The procedure may vary among health care providers and hospitals. What can I expect after the procedure? After the procedure, it is common to have: Some soreness or pain in your abdomen and urethra. Urinary symptoms. These include: Mild pain or burning when you urinate. Pain should stop within a few minutes after you urinate. This may last for up to 1 week. A small amount of blood in your urine for several days. Feeling like you need to urinate but producing only a small amount of urine. Follow these instructions at home:  Medicines Take over-the-counter and prescription medicines only as told by your health care provider. If you were prescribed an antibiotic medicine, take it as told by your health care provider. Do not stop taking the antibiotic even if you start to feel better. General instructions Return to your normal activities as told by your health care provider. Ask your health care provider what activities are safe for you. If you were given a sedative during the procedure, it can affect you for several hours. Do not drive or operate machinery until your health care provider says that it is safe. Watch for any blood in your urine. If the amount of blood in your urine increases, call your health care provider. Follow  instructions from your health care provider about eating or drinking restrictions. If a tissue sample was removed for testing (biopsy) during your procedure, it is up to you to get your test results. Ask your health care provider, or the department that is doing the test, when your results will be ready. Drink enough fluid to keep your urine pale yellow. Keep all follow-up visits. This is important. Contact a health care provider if: You have pain that gets worse or does not get better with medicine, especially pain when you urinate. You have trouble urinating. You have more blood in your urine. Get help right away if: You have blood clots in your urine. You have abdominal pain. You have a fever or chills. You are unable to urinate. Summary Cystoscopy is a procedure that is used to help diagnose and sometimes treat conditions that affect the lower urinary tract. Cystoscopy is done using a thin, tube-shaped instrument with a light and camera at the end. After the procedure, it is common to have some soreness or pain in your abdomen and urethra. Watch for any blood in your urine. If the amount of blood in your urine increases, call your health care provider. If you were prescribed an antibiotic medicine, take it as told by your health care provider. Do not stop taking the antibiotic even if you start to feel better. This information is not intended to replace advice given to you by your health care provider. Make sure you discuss any questions you have with your health care provider. Document Revised: 05/31/2021 Document Reviewed: 04/29/2020 Elsevier Patient Education  [place].

## 2021-08-16 ENCOUNTER — Other Ambulatory Visit: Payer: Self-pay

## 2021-08-16 ENCOUNTER — Encounter (HOSPITAL_COMMUNITY)
Admission: RE | Admit: 2021-08-16 | Discharge: 2021-08-16 | Disposition: A | Discharge status: Home / Self Care | Payer: Medicare Other | Source: Ambulatory Visit | Attending: Urology | Admitting: Urology

## 2021-08-16 ENCOUNTER — Encounter (HOSPITAL_COMMUNITY): Payer: Self-pay

## 2021-08-16 VITALS — BP 144/76 | HR 73 | Temp 97.8°F | Resp 18 | Ht 72.0 in | Wt 165.0 lb

## 2021-08-16 DIAGNOSIS — Z01818 Encounter for other preprocedural examination: Secondary | ICD-10-CM | POA: Diagnosis not present

## 2021-08-16 DIAGNOSIS — D494 Neoplasm of unspecified behavior of bladder: Secondary | ICD-10-CM

## 2021-08-16 DIAGNOSIS — Z9889 Other specified postprocedural states: Secondary | ICD-10-CM

## 2021-08-16 LAB — CBC WITH DIFFERENTIAL/PLATELET
Abs Immature Granulocytes: 0.04 10*3/uL (ref 0.00–0.07)
Basophils Absolute: 0.1 10*3/uL (ref 0.0–0.1)
Basophils Relative: 1 %
Eosinophils Absolute: 0.3 10*3/uL (ref 0.0–0.5)
Eosinophils Relative: 3 %
HCT: 48.9 % (ref 39.0–52.0)
Hemoglobin: 16.6 g/dL (ref 13.0–17.0)
Immature Granulocytes: 1 %
Lymphocytes Relative: 23 %
Lymphs Abs: 1.8 10*3/uL (ref 0.7–4.0)
MCH: 32.7 pg (ref 26.0–34.0)
MCHC: 33.9 g/dL (ref 30.0–36.0)
MCV: 96.3 fL (ref 80.0–100.0)
Monocytes Absolute: 1.1 10*3/uL — ABNORMAL HIGH (ref 0.1–1.0)
Monocytes Relative: 13 %
Neutro Abs: 4.7 10*3/uL (ref 1.7–7.7)
Neutrophils Relative %: 59 %
Platelets: 241 10*3/uL (ref 150–400)
RBC: 5.08 MIL/uL (ref 4.22–5.81)
RDW: 13 % (ref 11.5–15.5)
WBC: 8 10*3/uL (ref 4.0–10.5)
nRBC: 0 % (ref 0.0–0.2)

## 2021-08-16 LAB — BASIC METABOLIC PANEL
Anion gap: 7 (ref 5–15)
BUN: 26 mg/dL — ABNORMAL HIGH (ref 8–23)
CO2: 22 mmol/L (ref 22–32)
Calcium: 8.7 mg/dL — ABNORMAL LOW (ref 8.9–10.3)
Chloride: 106 mmol/L (ref 98–111)
Creatinine, Ser: 0.83 mg/dL (ref 0.61–1.24)
GFR, Estimated: >60 mL/min (ref >60)
Glucose, Bld: 100 mg/dL — ABNORMAL HIGH (ref 70–99)
Potassium: 3.8 mmol/L (ref 3.5–5.1)
Sodium: 135 mmol/L (ref 135–145)

## 2021-08-16 MED ORDER — GEMCITABINE CHEMO FOR BLADDER INSTILLATION 2000 MG
2000.0000 mg | Freq: Once | INTRAVENOUS | Status: DC
  Filled 2021-08-16: qty 52.6

## 2021-08-17 ENCOUNTER — Ambulatory Visit (HOSPITAL_COMMUNITY): Payer: Medicare Other

## 2021-08-17 ENCOUNTER — Ambulatory Visit (HOSPITAL_COMMUNITY): Payer: Medicare Other | Admitting: Anesthesiology

## 2021-08-17 ENCOUNTER — Encounter (HOSPITAL_COMMUNITY)
Admission: RE | Disposition: A | Discharge status: Home / Self Care | Payer: Self-pay | Source: Home / Self Care | Attending: Urology

## 2021-08-17 ENCOUNTER — Ambulatory Visit (HOSPITAL_COMMUNITY)
Admission: RE | Admit: 2021-08-17 | Discharge: 2021-08-17 | Disposition: A | Discharge status: Home / Self Care | Payer: Medicare Other | Attending: Urology | Admitting: Urology

## 2021-08-17 ENCOUNTER — Encounter (HOSPITAL_COMMUNITY): Payer: Self-pay | Admitting: Urology

## 2021-08-17 DIAGNOSIS — N1330 Unspecified hydronephrosis: Secondary | ICD-10-CM | POA: Diagnosis not present

## 2021-08-17 DIAGNOSIS — Z79899 Other long term (current) drug therapy: Secondary | ICD-10-CM | POA: Insufficient documentation

## 2021-08-17 DIAGNOSIS — I10 Essential (primary) hypertension: Secondary | ICD-10-CM | POA: Insufficient documentation

## 2021-08-17 DIAGNOSIS — D090 Carcinoma in situ of bladder: Secondary | ICD-10-CM | POA: Insufficient documentation

## 2021-08-17 DIAGNOSIS — C679 Malignant neoplasm of bladder, unspecified: Secondary | ICD-10-CM | POA: Diagnosis not present

## 2021-08-17 DIAGNOSIS — R310 Gross hematuria: Secondary | ICD-10-CM | POA: Diagnosis not present

## 2021-08-17 DIAGNOSIS — K219 Gastro-esophageal reflux disease without esophagitis: Secondary | ICD-10-CM | POA: Diagnosis not present

## 2021-08-17 HISTORY — PX: CYSTOSCOPY W/ RETROGRADES: SHX1426

## 2021-08-17 HISTORY — PX: TRANSURETHRAL RESECTION OF BLADDER TUMOR: SHX2575

## 2021-08-17 SURGERY — CYSTOSCOPY, WITH RETROGRADE PYELOGRAM
Anesthesia: General

## 2021-08-17 MED ORDER — FENTANYL CITRATE (PF) 250 MCG/5ML IJ SOLN
INTRAMUSCULAR | Status: AC
  Filled 2021-08-17: qty 5

## 2021-08-17 MED ORDER — LACTATED RINGERS IV SOLN: INTRAVENOUS | Status: DC

## 2021-08-17 MED ORDER — OXYCODONE-ACETAMINOPHEN 5-325 MG PO TABS
ORAL_TABLET | ORAL | Status: AC
  Filled 2021-08-17: qty 1

## 2021-08-17 MED ORDER — ONDANSETRON HCL 4 MG/2ML IJ SOLN
INTRAMUSCULAR | Status: DC | PRN
  Administered 2021-08-17: 4 mg via INTRAVENOUS

## 2021-08-17 MED ORDER — SODIUM CHLORIDE 0.9 % IR SOLN
Status: DC | PRN
  Administered 2021-08-17 (×2): 3000 mL

## 2021-08-17 MED ORDER — ROCURONIUM BROMIDE 10 MG/ML (PF) SYRINGE
PREFILLED_SYRINGE | INTRAVENOUS | Status: AC
  Filled 2021-08-17: qty 10

## 2021-08-17 MED ORDER — OXYCODONE-ACETAMINOPHEN 5-325 MG PO TABS
1.0000 | ORAL_TABLET | ORAL | Status: AC | PRN
  Administered 2021-08-17: 15:00:00 1 via ORAL

## 2021-08-17 MED ORDER — FENTANYL CITRATE PF 50 MCG/ML IJ SOSY: 25.0000 ug | PREFILLED_SYRINGE | INTRAMUSCULAR | Status: DC | PRN

## 2021-08-17 MED ORDER — PROPOFOL 10 MG/ML IV BOLUS
INTRAVENOUS | Status: DC | PRN
  Administered 2021-08-17: 150 mg via INTRAVENOUS

## 2021-08-17 MED ORDER — LIDOCAINE HCL (PF) 2 % IJ SOLN
INTRAMUSCULAR | Status: AC
  Filled 2021-08-17: qty 5

## 2021-08-17 MED ORDER — LIDOCAINE HCL (CARDIAC) PF 50 MG/5ML IV SOSY
PREFILLED_SYRINGE | INTRAVENOUS | Status: DC | PRN
  Administered 2021-08-17: 60 mg via INTRAVENOUS

## 2021-08-17 MED ORDER — MIDAZOLAM HCL 2 MG/2ML IJ SOLN
INTRAMUSCULAR | Status: AC
  Filled 2021-08-17: qty 2

## 2021-08-17 MED ORDER — ONDANSETRON HCL 4 MG/2ML IJ SOLN: 4.0000 mg | Freq: Once | INTRAMUSCULAR | Status: DC | PRN

## 2021-08-17 MED ORDER — CEFAZOLIN SODIUM-DEXTROSE 2-4 GM/100ML-% IV SOLN
2.0000 g | INTRAVENOUS | Status: AC
  Administered 2021-08-17: 12:00:00 2 g via INTRAVENOUS
  Filled 2021-08-17: qty 100

## 2021-08-17 MED ORDER — STERILE WATER FOR IRRIGATION IR SOLN
Status: DC | PRN
  Administered 2021-08-17: 1000 mL

## 2021-08-17 MED ORDER — MIDAZOLAM HCL 5 MG/5ML IJ SOLN
INTRAMUSCULAR | Status: DC | PRN
Start: 2021-08-17 — End: 2021-08-17
  Administered 2021-08-17: 1 mg via INTRAVENOUS
  Administered 2021-08-17: 2 mg via INTRAVENOUS

## 2021-08-17 MED ORDER — DIATRIZOATE MEGLUMINE 30 % UR SOLN
URETHRAL | Status: DC | PRN
  Administered 2021-08-17: 12:00:00 30 mL via URETHRAL

## 2021-08-17 MED ORDER — ROCURONIUM 10MG/ML (10ML) SYRINGE FOR MEDFUSION PUMP - OPTIME
INTRAVENOUS | Status: DC | PRN
  Administered 2021-08-17: 20 mg via INTRAVENOUS
  Administered 2021-08-17: 10 mg via INTRAVENOUS
  Administered 2021-08-17: 40 mg via INTRAVENOUS

## 2021-08-17 MED ORDER — DIATRIZOATE MEGLUMINE 30 % UR SOLN
URETHRAL | Status: AC
  Filled 2021-08-17: qty 100

## 2021-08-17 MED ORDER — FENTANYL CITRATE (PF) 100 MCG/2ML IJ SOLN
INTRAMUSCULAR | Status: DC | PRN
  Administered 2021-08-17 (×3): 50 ug via INTRAVENOUS
  Administered 2021-08-17: 100 ug via INTRAVENOUS
  Administered 2021-08-17 (×2): 50 ug via INTRAVENOUS

## 2021-08-17 MED ORDER — OXYCODONE-ACETAMINOPHEN 5-325 MG PO TABS: 1.0000 | ORAL_TABLET | ORAL | 0 refills | Status: DC | PRN

## 2021-08-17 MED ORDER — GEMCITABINE CHEMO FOR BLADDER INSTILLATION 2000 MG
INTRAVENOUS | Status: DC | PRN
  Administered 2021-08-17: 2000 mg via INTRAVESICAL

## 2021-08-17 MED ORDER — FENTANYL CITRATE (PF) 100 MCG/2ML IJ SOLN
INTRAMUSCULAR | Status: AC
  Filled 2021-08-17: qty 2

## 2021-08-17 SURGICAL SUPPLY — 31 items
BAG DRAIN URO TABLE W/ADPT NS (BAG) ×3 IMPLANT
BAG DRN 8 ADPR NS SKTRN CSTL (BAG) ×2
BAG DRN RND TRDRP ANRFLXCHMBR (UROLOGICAL SUPPLIES) ×2
BAG HAMPER (MISCELLANEOUS) ×3 IMPLANT
BAG URINE DRAIN 2000ML AR STRL (UROLOGICAL SUPPLIES) ×3 IMPLANT
CATH FOLEY 3WAY 30CC 22F (CATHETERS) ×1 IMPLANT
CATH FOLEY LATEX FREE 22FR (CATHETERS)
CATH FOLEY LF 22FR (CATHETERS) IMPLANT
CATH INTERMIT  6FR 70CM (CATHETERS) ×3 IMPLANT
CLOTH BEACON ORANGE TIMEOUT ST (SAFETY) ×3 IMPLANT
DECANTER SPIKE VIAL GLASS SM (MISCELLANEOUS) ×3 IMPLANT
ELECT LOOP 22F BIPOLAR SML (ELECTROSURGICAL) ×3
ELECTRODE LOOP 22F BIPOLAR SML (ELECTROSURGICAL) ×2 IMPLANT
GLOVE SURG POLYISO LF SZ8 (GLOVE) ×3 IMPLANT
GLOVE SURG UNDER POLY LF SZ7 (GLOVE) ×6 IMPLANT
GOWN STRL REUS W/TWL LRG LVL3 (GOWN DISPOSABLE) ×3 IMPLANT
GOWN STRL REUS W/TWL XL LVL3 (GOWN DISPOSABLE) ×3 IMPLANT
GUIDEWIRE STR DUAL SENSOR (WIRE) ×1 IMPLANT
IV NS IRRIG 3000ML ARTHROMATIC (IV SOLUTION) ×6 IMPLANT
KIT CHEMO SPILL (MISCELLANEOUS) ×1 IMPLANT
KIT TURNOVER CYSTO (KITS) ×3 IMPLANT
MANIFOLD NEPTUNE II (INSTRUMENTS) ×3 IMPLANT
PACK CYSTO (CUSTOM PROCEDURE TRAY) ×3 IMPLANT
PAD ARMBOARD 7.5X6 YLW CONV (MISCELLANEOUS) ×3 IMPLANT
PLUG CATH AND CAP STER (CATHETERS) IMPLANT
STENT CONTOUR 6FRX26X.035 (STENTS) ×1 IMPLANT
SYR 30ML LL (SYRINGE) ×1 IMPLANT
SYR TOOMEY IRRIG 70ML (MISCELLANEOUS) ×3
SYRINGE TOOMEY IRRIG 70ML (MISCELLANEOUS) IMPLANT
TOWEL OR 17X26 4PK STRL BLUE (TOWEL DISPOSABLE) ×3 IMPLANT
WATER STERILE IRR 500ML POUR (IV SOLUTION) ×3 IMPLANT

## 2021-08-17 NOTE — Anesthesia Preprocedure Evaluation (Signed)
Anesthesia Evaluation  Patient identified by MRN, date of birth, ID band Patient awake    Reviewed: Allergy & Precautions, H&P , NPO status , Patient's Chart, lab work & pertinent test results, reviewed documented beta blocker date and time   Airway Mallampati: II  TM Distance: >3 FB Neck ROM: full    Dental no notable dental hx.    Pulmonary neg pulmonary ROS,    Pulmonary exam normal breath sounds clear to auscultation       Cardiovascular Exercise Tolerance: Good hypertension, negative cardio ROS   Rhythm:regular Rate:Normal     Neuro/Psych PSYCHIATRIC DISORDERS Anxiety Depression negative neurological ROS     GI/Hepatic Neg liver ROS, GERD  Medicated,  Endo/Other  negative endocrine ROS  Renal/GU negative Renal ROS  negative genitourinary   Musculoskeletal   Abdominal   Peds  Hematology negative hematology ROS (+)   Anesthesia Other Findings   Reproductive/Obstetrics negative OB ROS                             Anesthesia Physical Anesthesia Plan  ASA: 2  Anesthesia Plan: General   Post-op Pain Management:    Induction:   PONV Risk Score and Plan:   Airway Management Planned:   Additional Equipment:   Intra-op Plan:   Post-operative Plan:   Informed Consent: I have reviewed the patients History and Physical, chart, labs and discussed the procedure including the risks, benefits and alternatives for the proposed anesthesia with the patient or authorized representative who has indicated his/her understanding and acceptance.     Dental Advisory Given  Plan Discussed with: CRNA  Anesthesia Plan Comments:         Anesthesia Quick Evaluation

## 2021-08-17 NOTE — Transfer of Care (Signed)
Immediate Anesthesia Transfer of Care Note  Patient: Chad Butler [facility]  Procedure(s) Performed: CYSTOSCOPY WITH RETROGRADE PYELOGRAM (Bilateral) TRANSURETHRAL RESECTION OF BLADDER TUMOR (TURBT)  Patient Location: PACU  Anesthesia Type:General  Level of Consciousness: awake  Airway & Oxygen Therapy: Patient Spontanous Breathing and Patient connected to face mask oxygen  Post-op Assessment: Report given to RN  Post vital signs: Reviewed  Last Vitals:  Vitals Value Taken Time  BP 151/89 08/17/21 1245  Temp 36.4 C 08/17/21 1245  Pulse 75 08/17/21 1250  Resp 12 08/17/21 1250  SpO2 97 % 08/17/21 1250  Vitals shown include unvalidated device data.  Last Pain:  Vitals:   08/17/21 1245  TempSrc:   PainSc: 0-No pain      Patients Stated Pain Goal: 5 (24/46/95 0722)  Complications: No notable events documented.

## 2021-08-17 NOTE — Interval H&P Note (Signed)
History and Physical Interval Note:  08/17/2021 10:52 AM  Chad Butler  has presented today for surgery, with the diagnosis of bladder cancer.  The various methods of treatment have been discussed with the patient and family. After consideration of risks, benefits and other options for treatment, the patient has consented to  Procedure(s): CYSTOSCOPY WITH RETROGRADE PYELOGRAM (Bilateral) TRANSURETHRAL RESECTION OF BLADDER TUMOR (TURBT) (N/A) as a surgical intervention.  The patient's history has been reviewed, patient examined, no change in status, stable for surgery.  I have reviewed the patient's chart and labs.  Questions were answered to the patient's satisfaction.     [name]

## 2021-08-17 NOTE — Op Note (Signed)
Preoperative diagnosis: Bladder Tumor  Postop diagnosis: Same  Procedure: 1.  Cystoscopy 2. Bilateral retrograde pyelography 3. Intra-operative fluoroscopy, under 1 hour, with interpretation 4.Transurethral resection of bladder tumor, medium 5. Left 6x26 JJ ureteral Stent Placement   6. Instillation of gemcitabine   Attending: [name]  Anesthesia: General  Estimated blood loss: 5 cc  Drains: 1. 22 French Foley catheter 2. Left 6x26 JJ ureteral Stent without tether  Specimens: Bladder tumor  Antibiotics: Ancef  Findings: numerous areas of erythema on right and left lateral wall concerning for CIS. 2cm papillary tumor left lateral wall, 1cm papillary tumor right lateral wall. Severe left hydroureteronephrosis. No right hydronephrosis.  Indications: Patient is a 70 year old with a history of  bladder tumor found on office cystoscopy.   After discussing treatment options patient decided to proceed with transurethral resection of bladder tumor  Procedure in detail: Prior to procedure consetn was obtained. Patient was brought to the operating room and briefing was done sure correct patient, correct procedure, correct site.  General anesthesia was in administered patient was placed in the dorsal lithotomy position.  The rigid 63 French cystoscope was passed urethra and bladder.  Bladder was inspected masses or lesions and we noted a diffuse centimeter lesion on the right lateral wall as well as a 2 cm lesion in the left lateral wall of the bladder and a 1cm right lateral wall tumor.  We then cannulated the right ureteral orifice with a 6 French ureteral catheter.  A gentle retrograde was obtained in findings noted above.  We then turned our attention to the left ureteral orifice.  The left ureteral orifice was cannulated with a 6 French ureteral catheter.  A gentle retrograde was obtained in findings noted above.  We then placed a zip wire through the ureteral catheter and advanced up  to the renal pelvis. We then placed a 6 x 26 double-J ureteral stent over the wire.  We then removed the wire and good coil was noted in the pelvis under fluoroscopy in the bladder under direct vision.  Removed the cystoscope and placed a 80 French resectoscope in the bladder.  Using bipolar electrocautery were then removed the 2 bladder tumors.  We removed the bladder tumors down to the base exposing muscle.  We then removed the pieces and sent them for pathology.  To obtain hemostasis we then cauterized the bed of the tumors.  Once good hemostasis was noted the bladder was then drained and a 22 French Foley catheter was placed. We then instilled 2g of gemitabine into the bladder. This concluded the procedure which resulted by the patient.  Complications: None  Condition: Stable,  extubated, transferred to PACU.  Plan: Gemcitabine will be drained in 1 hour. The patient is to be discharged home and followup in 1 week for voiding trial. He will need to be scheduled for ureteroscopy of the left ureter due to severe left hydronephrosis

## 2021-08-17 NOTE — Anesthesia Postprocedure Evaluation (Signed)
Anesthesia Post Note  Patient: Jahmere Bramel [facility]  Procedure(s) Performed: CYSTOSCOPY WITH RETROGRADE PYELOGRAM (Bilateral) TRANSURETHRAL RESECTION OF BLADDER TUMOR (TURBT)  Patient location during evaluation: Phase II Anesthesia Type: General Level of consciousness: awake Pain management: pain level controlled Vital Signs Assessment: post-procedure vital signs reviewed and stable Respiratory status: spontaneous breathing and respiratory function stable Cardiovascular status: blood pressure returned to baseline and stable Postop Assessment: no headache and no apparent nausea or vomiting Anesthetic complications: no Comments: Late entry   No notable events documented.   Last Vitals:  Vitals:   08/17/21 1404 08/17/21 1435  BP: (!) 169/97 (!) 137/96  Pulse: 68 68  Resp: 12 16  Temp:  36.9 C  SpO2: 96% 95%    Last Pain:  Vitals:   08/17/21 1435  TempSrc: Oral  PainSc: [place]

## 2021-08-17 NOTE — Anesthesia Procedure Notes (Signed)
Procedure Name: Intubation Date/Time: 08/17/2021 11:28 AM Performed by: Ollen Bowl, CRNA Pre-anesthesia Checklist: Patient identified, Patient being monitored, Timeout performed, Emergency Drugs available and Suction available Patient Re-evaluated:Patient Re-evaluated prior to induction Oxygen Delivery Method: Circle system utilized Preoxygenation: Pre-oxygenation with 100% oxygen Induction Type: IV induction Ventilation: Mask ventilation without difficulty Laryngoscope Size: Mac and 3 Grade View: Grade I Tube type: Oral Tube size: 7.0 mm Number of attempts: 1 Airway Equipment and Method: Stylet Placement Confirmation: ETT inserted through vocal cords under direct vision, positive ETCO2 and breath sounds checked- equal and bilateral Secured at: 22 cm Tube secured with: Tape Dental Injury: Teeth and Oropharynx as per pre-operative assessment

## 2021-08-18 ENCOUNTER — Encounter (HOSPITAL_COMMUNITY): Payer: Self-pay | Admitting: Urology

## 2021-08-18 LAB — SURGICAL PATHOLOGY

## 2021-09-04 NOTE — H&P (View-Only) (Signed)
History of Present Illness: Here for f/u of bladder cancer   11.17.2022: cysto, (B) RGPS, Lt J2 stent, TURBT, gemcitabine.  He did have severe left hydronephrosis. 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  He has not been informed of his pathology results.  Past Surgical History:  Procedure Laterality Date   ADRENALECTOMY     [facility]   CHOLECYSTECTOMY  2001   COLONOSCOPY  11/09/2011   Dr. Oneida Alar: two tubular adenomas, mild diverticulosis, internal hemorrhoids, surveillance in 10 years    CYST EXCISION  1970's   back   CYSTOSCOPY W/ RETROGRADES Bilateral 08/17/2021   Procedure: CYSTOSCOPY WITH RETROGRADE PYELOGRAM;  Surgeon: Cleon Gustin, MD;  Location: [facility];  Service: Urology;  Laterality: Bilateral;   ESOPHAGOGASTRODUODENOSCOPY  10/07/2012   Dr. Arnoldo Morale: normal, Clotest negative   HERNIA REPAIR  1956   INGUINAL HERNIA REPAIR Right 11/04/2013   Procedure: RECURRENT RIGHT INGUINAL HERNIA REPAIR;  Surgeon: Jamesetta So, MD;  Location: [facility];  Service: General;  Laterality: Right;   Left shoulder surgery  2011   LESION REMOVAL N/A 11/04/2013   Procedure: EXCISION OF SKIN LESION ABDOMINAL WALL;  Surgeon: Jamesetta So, MD;  Location: [facility];  Service: General;  Laterality: N/A;   TRANSURETHRAL RESECTION OF BLADDER TUMOR N/A 08/17/2021   Procedure: TRANSURETHRAL RESECTION OF BLADDER TUMOR (TURBT);  Surgeon: Cleon Gustin, MD;  Location: [facility];  Service: Urology;  Laterality: N/A;    Home Medications:  Allergies as of 09/05/2021       Reactions   Red Dye Nausea And Vomiting   Gadolinium Nausea And Vomiting    Code: VOM, with injection, no other reactions noted, Onset Date: [id number]        Medication List        Accurate as of September 04, 2021  6:54 PM. If you have any questions, ask your nurse or doctor.          ALPRAZolam 0.5 MG tablet Commonly known as: XANAX Take 0.5 mg by  mouth at bedtime. For sleep.   diphenhydrAMINE 25 MG tablet Commonly known as: BENADRYL Take 25 mg by mouth daily as needed for allergies.   doxylamine (Sleep) 25 MG tablet Commonly known as: UNISOM Take 12.5 mg by mouth at bedtime as needed for sleep.   ibuprofen 200 MG tablet Commonly known as: ADVIL Take 200 mg by mouth every 6 (six) hours as needed for headache or moderate pain.   multivitamin with minerals Tabs tablet Take 1 tablet by mouth daily.   omeprazole 20 MG capsule Commonly known as: PRILOSEC Take 20 mg by mouth daily.   oxyCODONE-acetaminophen 5-325 MG tablet Commonly known as: Percocet Take 1 tablet by mouth every 4 (four) hours as needed for severe pain.   silodosin 8 MG Caps capsule Commonly known as: RAPAFLO Take 1 capsule (8 mg total) by mouth daily with breakfast.   SINUS/ALLERGY PE PO Take 1 tablet by mouth daily as needed (allergies).        Allergies:  Allergies  Allergen Reactions   Red Dye Nausea And Vomiting   Gadolinium Nausea And Vomiting     Code: VOM, with injection, no other reactions noted, Onset Date: [id number]     Family History  Problem Relation Age of Onset   Hypertension Mother    Cancer Father    Colon cancer Neg Hx    Diabetes Neg Hx  Thyroid disease Neg Hx     Social History:  reports that he has never smoked. He has never used smokeless tobacco. He reports current alcohol use. He reports that he does not use drugs.  ROS: A complete review of systems was performed.  All systems are negative except for pertinent findings as noted.  Physical Exam:  Vital signs in last 24 hours: There were no vitals taken for this visit. Constitutional:  Alert and oriented, No acute distress Cardiovascular: Regular rate  Respiratory: Normal respiratory effort GI: Abdomen is soft, nontender, nondistended, no abdominal masses. No CVAT.  Genitourinary: Normal male phallus, testes are descended bilaterally and non-tender and without  masses, scrotum is normal in appearance without lesions or masses, perineum is normal on inspection. Lymphatic: No lymphadenopathy Neurologic: Grossly intact, no focal deficits Psychiatric: Normal mood and affect  I have reviewed prior pt notes  I have reviewed notes from referring/previous physicians  I have reviewed urinalysis results  I have independently reviewed prior imaging  Path results reviewed w/ pt    Impression/Assessment:  1.  Left hydronephrosis diagnosed on recent TURBT/retrogrades.  Stent placed.  Will need follow-up ureteroscopy  2.  Carcinoma in situ of bladder.  Status post TURBT 2 weeks ago.  Will eventually need BCG induction  3.  Patient with Foley catheter-this was removed today.  Stent came out with Foley catheter  Plan:  1.  I discussed with the patient the following:      He will need cystoscopy, left retrograde and left ureteroscopy.  I will schedule him to have this done by Dr. Alyson Ingles again        He will eventually need BCG treatments, I discussed induction and maintenance with him.  2.  I will send him home with 3 days of an antibiotic to cover recent catheter placement  3.  He will return if he is unable to void or if he experiences flank pain

## 2021-09-04 NOTE — Progress Notes (Signed)
History of Present Illness: Here for f/u of bladder cancer   11.17.2022: cysto, (B) RGPS, Lt J2 stent, TURBT, gemcitabine.  He did have severe left hydronephrosis. 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  He has not been informed of his pathology results.  Past Surgical History:  Procedure Laterality Date   ADRENALECTOMY     [facility]   CHOLECYSTECTOMY  2001   COLONOSCOPY  11/09/2011   Dr. Oneida Alar: two tubular adenomas, mild diverticulosis, internal hemorrhoids, surveillance in 10 years    CYST EXCISION  1970's   back   CYSTOSCOPY W/ RETROGRADES Bilateral 08/17/2021   Procedure: CYSTOSCOPY WITH RETROGRADE PYELOGRAM;  Surgeon: Cleon Gustin, MD;  Location: [facility];  Service: Urology;  Laterality: Bilateral;   ESOPHAGOGASTRODUODENOSCOPY  10/07/2012   Dr. Arnoldo Morale: normal, Clotest negative   HERNIA REPAIR  1956   INGUINAL HERNIA REPAIR Right 11/04/2013   Procedure: RECURRENT RIGHT INGUINAL HERNIA REPAIR;  Surgeon: Jamesetta So, MD;  Location: [facility];  Service: General;  Laterality: Right;   Left shoulder surgery  2011   LESION REMOVAL N/A 11/04/2013   Procedure: EXCISION OF SKIN LESION ABDOMINAL WALL;  Surgeon: Jamesetta So, MD;  Location: [facility];  Service: General;  Laterality: N/A;   TRANSURETHRAL RESECTION OF BLADDER TUMOR N/A 08/17/2021   Procedure: TRANSURETHRAL RESECTION OF BLADDER TUMOR (TURBT);  Surgeon: Cleon Gustin, MD;  Location: [facility];  Service: Urology;  Laterality: N/A;    Home Medications:  Allergies as of 09/05/2021       Reactions   Red Dye Nausea And Vomiting   Gadolinium Nausea And Vomiting    Code: VOM, with injection, no other reactions noted, Onset Date: [id number]        Medication List        Accurate as of September 04, 2021  6:54 PM. If you have any questions, ask your nurse or doctor.          ALPRAZolam 0.5 MG tablet Commonly known as: XANAX Take 0.5 mg by  mouth at bedtime. For sleep.   diphenhydrAMINE 25 MG tablet Commonly known as: BENADRYL Take 25 mg by mouth daily as needed for allergies.   doxylamine (Sleep) 25 MG tablet Commonly known as: UNISOM Take 12.5 mg by mouth at bedtime as needed for sleep.   ibuprofen 200 MG tablet Commonly known as: ADVIL Take 200 mg by mouth every 6 (six) hours as needed for headache or moderate pain.   multivitamin with minerals Tabs tablet Take 1 tablet by mouth daily.   omeprazole 20 MG capsule Commonly known as: PRILOSEC Take 20 mg by mouth daily.   oxyCODONE-acetaminophen 5-325 MG tablet Commonly known as: Percocet Take 1 tablet by mouth every 4 (four) hours as needed for severe pain.   silodosin 8 MG Caps capsule Commonly known as: RAPAFLO Take 1 capsule (8 mg total) by mouth daily with breakfast.   SINUS/ALLERGY PE PO Take 1 tablet by mouth daily as needed (allergies).        Allergies:  Allergies  Allergen Reactions   Red Dye Nausea And Vomiting   Gadolinium Nausea And Vomiting     Code: VOM, with injection, no other reactions noted, Onset Date: [id number]     Family History  Problem Relation Age of Onset   Hypertension Mother    Cancer Father    Colon cancer Neg Hx    Diabetes Neg Hx  Thyroid disease Neg Hx     Social History:  reports that he has never smoked. He has never used smokeless tobacco. He reports current alcohol use. He reports that he does not use drugs.  ROS: A complete review of systems was performed.  All systems are negative except for pertinent findings as noted.  Physical Exam:  Vital signs in last 24 hours: There were no vitals taken for this visit. Constitutional:  Alert and oriented, No acute distress Cardiovascular: Regular rate  Respiratory: Normal respiratory effort GI: Abdomen is soft, nontender, nondistended, no abdominal masses. No CVAT.  Genitourinary: Normal male phallus, testes are descended bilaterally and non-tender and without  masses, scrotum is normal in appearance without lesions or masses, perineum is normal on inspection. Lymphatic: No lymphadenopathy Neurologic: Grossly intact, no focal deficits Psychiatric: Normal mood and affect  I have reviewed prior pt notes  I have reviewed notes from referring/previous physicians  I have reviewed urinalysis results  I have independently reviewed prior imaging  Path results reviewed w/ pt    Impression/Assessment:  1.  Left hydronephrosis diagnosed on recent TURBT/retrogrades.  Stent placed.  Will need follow-up ureteroscopy  2.  Carcinoma in situ of bladder.  Status post TURBT 2 weeks ago.  Will eventually need BCG induction  3.  Patient with Foley catheter-this was removed today.  Stent came out with Foley catheter  Plan:  1.  I discussed with the patient the following:      He will need cystoscopy, left retrograde and left ureteroscopy.  I will schedule him to have this done by Dr. Alyson Ingles again        He will eventually need BCG treatments, I discussed induction and maintenance with him.  2.  I will send him home with 3 days of an antibiotic to cover recent catheter placement  3.  He will return if he is unable to void or if he experiences flank pain

## 2021-09-05 ENCOUNTER — Other Ambulatory Visit: Payer: Self-pay | Admitting: Urology

## 2021-09-05 ENCOUNTER — Ambulatory Visit (INDEPENDENT_AMBULATORY_CARE_PROVIDER_SITE_OTHER): Payer: Medicare Other | Admitting: Urology

## 2021-09-05 ENCOUNTER — Other Ambulatory Visit: Payer: Self-pay

## 2021-09-05 ENCOUNTER — Encounter: Payer: Self-pay | Admitting: Urology

## 2021-09-05 VITALS — BP 166/92 | HR 84

## 2021-09-05 DIAGNOSIS — R310 Gross hematuria: Secondary | ICD-10-CM

## 2021-09-05 DIAGNOSIS — R339 Retention of urine, unspecified: Secondary | ICD-10-CM | POA: Diagnosis not present

## 2021-09-05 DIAGNOSIS — N1330 Unspecified hydronephrosis: Secondary | ICD-10-CM

## 2021-09-05 DIAGNOSIS — D090 Carcinoma in situ of bladder: Secondary | ICD-10-CM | POA: Diagnosis not present

## 2021-09-05 DIAGNOSIS — C678 Malignant neoplasm of overlapping sites of bladder: Secondary | ICD-10-CM

## 2021-09-05 MED ORDER — SULFAMETHOXAZOLE-TRIMETHOPRIM 800-160 MG PO TABS: 1.0000 | ORAL_TABLET | Freq: Two times a day (BID) | ORAL | 0 refills | Status: DC

## 2021-09-05 NOTE — Progress Notes (Signed)
Post op vist for turp/

## 2021-09-05 NOTE — Progress Notes (Signed)
[facility] Surgery Posting Form   Surgery Date/Time: Date: 09/07/2021  Surgeon: Dr. [name], MD  Surgery Location: Day Surgery  Inpt ( No  )   Outpt (Yes)   Obs ( No  )   Diagnosis: Left Hydronephrosis N13.30  -CPT: 56256, 38937, [phone]  Surgery:  Left Cystoscopy with stent placement and left diagnostic Ureteroscopy with left retrograde pyelogram  Stop Anticoagulations: No, may continue ASA  Cardiac/Medical/Pulmonary Clearance needed: No   *Orders entered into EPIC  Date: 09/05/21   *Case booked in [place]  Date: 09/05/21  *Notified pt of Surgery: Date: 09/05/21  *Placed into Prior Authorization Work Fabio Bering Date: 09/05/21   Assistant/laser/rep:No

## 2021-09-05 NOTE — Progress Notes (Signed)
Surgical Physician Order Form [facility]  * Scheduling expectation :  09/07/2021  *Length of Case: 30 minutes  *MD Preforming Case: [name], MD  *Assistant Needed: no  *Facility Preference: [facility]  *Clearance needed: no  *Anticoagulation Instructions: N/A  *Aspirin Instructions: Ok to continue Aspirin  -Admit type: OUTpatient  -Anesthesia: General  -Use Standing Orders:  na  *Diagnosis:  Left hydronephrosis  *Procedure: left  Cysto w/stent exchange (71245) left diagnostic ureteroscopy, left retrograde pyelogram  Additional orders: N/A  -Equipment: Digital U-scope -VTE Prophylaxis Standing Order SCD's       Other:   -Standing Lab Orders Per Anesthesia    Lab other: None  -Standing Test orders EKG/Chest x-ray per Anesthesia       Test other:   - Medications:  Ancef 2gm IV  -Other orders:   SCDs  *Post-op visit Date/Instructions:  1 week cysto stent removal

## 2021-09-05 NOTE — Progress Notes (Signed)
Fill and Pull Catheter Removal  Patient is present today for a catheter removal.  Patient was cleaned and prepped in a sterile fashion 167ml of sterile water/ saline was instilled into the bladder when the patient felt the urge to urinate. 23ml of water was then drained from the balloon.  A 22FR foley cath was removed from the bladder no complications were noted .  Patient as then given some time to void on their own.  Patient can void  124ml on their own after some time.  Patient tolerated well.  Performed by: Lenna Sciara. [place]  Follow up/ Additional notes: Per MD Note

## 2021-09-06 ENCOUNTER — Encounter (HOSPITAL_COMMUNITY)
Admission: RE | Admit: 2021-09-06 | Discharge: 2021-09-06 | Disposition: A | Discharge status: Home / Self Care | Payer: Medicare Other | Source: Ambulatory Visit | Attending: Urology | Admitting: Urology

## 2021-09-06 DIAGNOSIS — F419 Anxiety disorder, unspecified: Secondary | ICD-10-CM | POA: Diagnosis not present

## 2021-09-06 DIAGNOSIS — E663 Overweight: Secondary | ICD-10-CM | POA: Diagnosis not present

## 2021-09-06 DIAGNOSIS — Z6822 Body mass index (BMI) 22.0-22.9, adult: Secondary | ICD-10-CM | POA: Diagnosis not present

## 2021-09-07 ENCOUNTER — Ambulatory Visit (HOSPITAL_COMMUNITY): Payer: Medicare Other

## 2021-09-07 ENCOUNTER — Ambulatory Visit (HOSPITAL_COMMUNITY)
Admission: RE | Admit: 2021-09-07 | Discharge: 2021-09-07 | Disposition: A | Discharge status: Home / Self Care | Payer: Medicare Other | Attending: Urology | Admitting: Urology

## 2021-09-07 ENCOUNTER — Encounter (HOSPITAL_COMMUNITY): Payer: Self-pay | Admitting: Urology

## 2021-09-07 ENCOUNTER — Other Ambulatory Visit: Payer: Self-pay

## 2021-09-07 ENCOUNTER — Ambulatory Visit (HOSPITAL_COMMUNITY): Payer: Medicare Other | Admitting: Anesthesiology

## 2021-09-07 ENCOUNTER — Encounter (HOSPITAL_COMMUNITY)
Admission: RE | Disposition: A | Discharge status: Home / Self Care | Payer: Self-pay | Source: Home / Self Care | Attending: Urology

## 2021-09-07 DIAGNOSIS — N1330 Unspecified hydronephrosis: Secondary | ICD-10-CM | POA: Diagnosis not present

## 2021-09-07 DIAGNOSIS — Z79899 Other long term (current) drug therapy: Secondary | ICD-10-CM | POA: Diagnosis not present

## 2021-09-07 DIAGNOSIS — K219 Gastro-esophageal reflux disease without esophagitis: Secondary | ICD-10-CM | POA: Insufficient documentation

## 2021-09-07 DIAGNOSIS — I10 Essential (primary) hypertension: Secondary | ICD-10-CM | POA: Diagnosis not present

## 2021-09-07 DIAGNOSIS — C679 Malignant neoplasm of bladder, unspecified: Secondary | ICD-10-CM | POA: Insufficient documentation

## 2021-09-07 HISTORY — PX: CYSTOSCOPY W/ RETROGRADES: SHX1426

## 2021-09-07 HISTORY — PX: URETEROSCOPY: SHX842

## 2021-09-07 SURGERY — URETEROSCOPY
Anesthesia: General | Site: Ureter | Laterality: Left

## 2021-09-07 MED ORDER — SODIUM CHLORIDE 0.9 % IR SOLN
Status: DC | PRN
  Administered 2021-09-07 (×2): 3000 mL via INTRAVESICAL

## 2021-09-07 MED ORDER — ONDANSETRON HCL 4 MG/2ML IJ SOLN
INTRAMUSCULAR | Status: DC | PRN
  Administered 2021-09-07: 4 mg via INTRAVENOUS

## 2021-09-07 MED ORDER — DIATRIZOATE MEGLUMINE 30 % UR SOLN
URETHRAL | Status: DC | PRN
  Administered 2021-09-07: 9 mL via URETHRAL

## 2021-09-07 MED ORDER — STERILE WATER FOR IRRIGATION IR SOLN
Status: DC | PRN
  Administered 2021-09-07: 1000 mL

## 2021-09-07 MED ORDER — FENTANYL CITRATE PF 50 MCG/ML IJ SOSY: 25.0000 ug | PREFILLED_SYRINGE | INTRAMUSCULAR | Status: DC | PRN

## 2021-09-07 MED ORDER — MIDAZOLAM HCL 2 MG/2ML IJ SOLN
INTRAMUSCULAR | Status: AC
  Filled 2021-09-07: qty 2

## 2021-09-07 MED ORDER — DEXAMETHASONE SODIUM PHOSPHATE 10 MG/ML IJ SOLN
INTRAMUSCULAR | Status: DC | PRN
  Administered 2021-09-07: 10 mg via INTRAVENOUS

## 2021-09-07 MED ORDER — PHENYLEPHRINE 40 MCG/ML (10ML) SYRINGE FOR IV PUSH (FOR BLOOD PRESSURE SUPPORT)
PREFILLED_SYRINGE | INTRAVENOUS | Status: AC
  Filled 2021-09-07: qty 10

## 2021-09-07 MED ORDER — DEXAMETHASONE SODIUM PHOSPHATE 10 MG/ML IJ SOLN
INTRAMUSCULAR | Status: AC
  Filled 2021-09-07: qty 1

## 2021-09-07 MED ORDER — CEFAZOLIN SODIUM-DEXTROSE 2-4 GM/100ML-% IV SOLN
INTRAVENOUS | Status: AC
  Filled 2021-09-07: qty 100

## 2021-09-07 MED ORDER — FENTANYL CITRATE (PF) 100 MCG/2ML IJ SOLN
INTRAMUSCULAR | Status: DC | PRN
  Administered 2021-09-07: 100 ug via INTRAVENOUS

## 2021-09-07 MED ORDER — ONDANSETRON HCL 4 MG/2ML IJ SOLN: 4.0000 mg | Freq: Once | INTRAMUSCULAR | Status: DC | PRN

## 2021-09-07 MED ORDER — LACTATED RINGERS IV SOLN: INTRAVENOUS | Status: DC

## 2021-09-07 MED ORDER — OXYCODONE-ACETAMINOPHEN 5-325 MG PO TABS: 1.0000 | ORAL_TABLET | ORAL | 0 refills | Status: DC | PRN

## 2021-09-07 MED ORDER — CEFAZOLIN SODIUM-DEXTROSE 2-4 GM/100ML-% IV SOLN
2.0000 g | INTRAVENOUS | Status: AC
  Administered 2021-09-07: 2 g via INTRAVENOUS

## 2021-09-07 MED ORDER — LIDOCAINE HCL (CARDIAC) PF 100 MG/5ML IV SOSY
PREFILLED_SYRINGE | INTRAVENOUS | Status: DC | PRN
  Administered 2021-09-07: 40 mg via INTRAVENOUS

## 2021-09-07 MED ORDER — MIDAZOLAM HCL 5 MG/5ML IJ SOLN
INTRAMUSCULAR | Status: DC | PRN
  Administered 2021-09-07: 2 mg via INTRAVENOUS

## 2021-09-07 MED ORDER — FENTANYL CITRATE (PF) 100 MCG/2ML IJ SOLN
INTRAMUSCULAR | Status: AC
  Filled 2021-09-07: qty 2

## 2021-09-07 MED ORDER — ONDANSETRON HCL 4 MG/2ML IJ SOLN
INTRAMUSCULAR | Status: AC
  Filled 2021-09-07: qty 2

## 2021-09-07 MED ORDER — DIATRIZOATE MEGLUMINE 30 % UR SOLN
URETHRAL | Status: AC
  Filled 2021-09-07: qty 100

## 2021-09-07 MED ORDER — PROPOFOL 10 MG/ML IV BOLUS
INTRAVENOUS | Status: AC
  Filled 2021-09-07: qty 20

## 2021-09-07 MED ORDER — PROPOFOL 10 MG/ML IV BOLUS
INTRAVENOUS | Status: DC | PRN
  Administered 2021-09-07: 150 mg via INTRAVENOUS

## 2021-09-07 SURGICAL SUPPLY — 21 items
BAG DRAIN URO TABLE W/ADPT NS (BAG) ×3 IMPLANT
BAG DRN 8 ADPR NS SKTRN CSTL (BAG) ×2
BAG HAMPER (MISCELLANEOUS) ×3 IMPLANT
CATH INTERMIT  6FR 70CM (CATHETERS) ×3 IMPLANT
CLOTH BEACON ORANGE TIMEOUT ST (SAFETY) ×3 IMPLANT
DECANTER SPIKE VIAL GLASS SM (MISCELLANEOUS) ×3 IMPLANT
GLOVE SURG POLYISO LF SZ8 (GLOVE) ×3 IMPLANT
GLOVE SURG UNDER POLY LF SZ7 (GLOVE) ×6 IMPLANT
GOWN STRL REUS W/TWL LRG LVL3 (GOWN DISPOSABLE) ×8 IMPLANT
GOWN STRL REUS W/TWL XL LVL3 (GOWN DISPOSABLE) ×3 IMPLANT
GUIDEWIRE STR DUAL SENSOR (WIRE) ×2 IMPLANT
IV NS IRRIG 3000ML ARTHROMATIC (IV SOLUTION) ×5 IMPLANT
KIT TURNOVER CYSTO (KITS) ×3 IMPLANT
MANIFOLD NEPTUNE II (INSTRUMENTS) ×3 IMPLANT
PACK CYSTO (CUSTOM PROCEDURE TRAY) ×3 IMPLANT
PAD ARMBOARD 7.5X6 YLW CONV (MISCELLANEOUS) ×3 IMPLANT
SYR 10ML LL (SYRINGE) ×2 IMPLANT
TOWEL NATURAL 4PK STERILE (DISPOSABLE) ×3 IMPLANT
TOWEL OR 17X26 4PK STRL BLUE (TOWEL DISPOSABLE) ×3 IMPLANT
WATER STERILE IRR 1000ML POUR (IV SOLUTION) ×2 IMPLANT
WATER STERILE IRR 500ML POUR (IV SOLUTION) ×3 IMPLANT

## 2021-09-07 NOTE — Op Note (Signed)
Preoperative diagnosis: Left hydronephrosis  Postoperative diagnosis: Same  Procedure: 1 cystoscopy 2.  left retrograde pyelography 3.  Intraoperative fluoroscopy, under one hour, with interpretation 4.  Left diagnostic ureteroscopy  Attending: Rosie Fate  Anesthesia: General  Estimated blood loss: None  Drains: none  Specimens: none  Antibiotics: ancef  Findings: moderate left hydronephrosis to the proximal ureter with tapering of the ureter at the proximal ureter.  Indications: Patient is a 70 year old male with a history of hydronephrosis.  After discussing treatment options, she decided proceed with left diagnostic ureteroscopy  Procedure in detail: The patient was brought to the operating room and a brief timeout was done to ensure correct patient, correct procedure, correct site.  General anesthesia was administered patient was placed in dorsal lithotomy position.  His genitalia was then prepped and draped in usual sterile fashion.  A rigid 21 French cystoscope was passed in the urethra and the bladder.  Bladder was inspected  and we noted healing bladder mucosa from previous bladder tumor resection. a 6 french ureteral catheter was then instilled into the left ureter orifice.  a gentle retrograde was obtained and findings noted above.  we then placed a zip wire through the ureteral catheter and advanced up to the renal pelvis.  we then removed the cystoscope and cannulated the left ureteral orifice with a semirigid ureteroscope.  we then performed ureteroscopy up to the level of the UPJ. No stone or tumor was encountered.  the bladder was then drained and this concluded the procedure which was well tolerated by patient.  Complications: None  Condition: Stable, extubated, transferred to PACU  Plan: Pt is to followup in 2 weeks

## 2021-09-07 NOTE — Anesthesia Procedure Notes (Signed)
Procedure Name: LMA Insertion Date/Time: 09/07/2021 11:11 AM Performed by: Jonna Munro, CRNA Pre-anesthesia Checklist: Patient identified, Emergency Drugs available, Suction available, Patient being monitored and Timeout performed Patient Re-evaluated:Patient Re-evaluated prior to induction Oxygen Delivery Method: Circle system utilized Preoxygenation: Pre-oxygenation with 100% oxygen Induction Type: IV induction LMA: LMA inserted LMA Size: 4.0 Number of attempts: 1 Placement Confirmation: positive ETCO2 and breath sounds checked- equal and bilateral Tube secured with: Tape Dental Injury: Teeth and Oropharynx as per pre-operative assessment

## 2021-09-07 NOTE — Interval H&P Note (Signed)
History and Physical Interval Note:  09/07/2021 10:39 AM  Chad Butler  has presented today for surgery, with the diagnosis of Left Hydronephrosis.  The various methods of treatment have been discussed with the patient and family. After consideration of risks, benefits and other options for treatment, the patient has consented to  Procedure(s): CYSTOSCOPY WITH STENT PLACEMENT (Left) URETEROSCOPY-DIAGNOSTIC (Left) CYSTOSCOPY WITH RETROGRADE PYELOGRAM (Left) as a surgical intervention.  The patient's history has been reviewed, patient examined, no change in status, stable for surgery.  I have reviewed the patient's chart and labs.  Questions were answered to the patient's satisfaction.     [name]

## 2021-09-07 NOTE — Anesthesia Postprocedure Evaluation (Signed)
Anesthesia Post Note  Patient: Chad Butler [facility]  Procedure(s) Performed: URETEROSCOPY-DIAGNOSTIC (Left: Ureter) CYSTOSCOPY WITH RETROGRADE PYELOGRAM (Left: Ureter)  Patient location during evaluation: PACU Anesthesia Type: General Level of consciousness: awake and alert and oriented Pain management: pain level controlled Vital Signs Assessment: post-procedure vital signs reviewed and stable Respiratory status: spontaneous breathing, nonlabored ventilation and respiratory function stable Cardiovascular status: blood pressure returned to baseline and stable Postop Assessment: no apparent nausea or vomiting Anesthetic complications: no   No notable events documented.   Last Vitals:  Vitals:   09/07/21 1200 09/07/21 1217  BP: 137/67 (!) 148/84  Pulse: 76 80  Resp: 14 17  Temp:  36.7 C  SpO2: 96% 100%    Last Pain:  Vitals:   09/07/21 1217  TempSrc: Oral  PainSc: 0-No pain                 [name] C Shai Rasmussen

## 2021-09-07 NOTE — Anesthesia Preprocedure Evaluation (Addendum)
Anesthesia Evaluation  Patient identified by MRN, date of birth, ID band Patient awake    Reviewed: Allergy & Precautions, NPO status , Patient's Chart, lab work & pertinent test results  History of Anesthesia Complications Negative for: history of anesthetic complications  Airway Mallampati: II  TM Distance: >3 FB Neck ROM: Full    Dental  (+) Dental Advisory Given, Missing   Pulmonary neg pulmonary ROS,    Pulmonary exam normal breath sounds clear to auscultation       Cardiovascular Exercise Tolerance: Good hypertension, Pt. on medications + dysrhythmias (PACs)  Rhythm:Irregular Rate:Normal  16-Aug-2021 08:15:52 [facility]-AP-300 ROUTINE RECORD [birth year] (35 yr) Male Caucasian Vent. rate 74 BPM PR interval 154 ms QRS duration 84 ms QT/QTcB 392/435 ms P-R-T axes 65 82 19 Normal sinus rhythm Septal infarct , age undetermined Abnormal ECG No significant change since last tracing Confirmed by Daneen Schick [phone]) on 08/16/2021 5:55:05 PM   Neuro/Psych PSYCHIATRIC DISORDERS Anxiety Depression negative neurological ROS     GI/Hepatic GERD  Controlled,  Endo/Other  negative endocrine ROS  Renal/GU negative Renal ROS Bladder dysfunction (bladder cancer)      Musculoskeletal negative musculoskeletal ROS (+)   Abdominal   Peds  Hematology negative hematology ROS (+)   Anesthesia Other Findings   Reproductive/Obstetrics negative OB ROS                            Anesthesia Physical Anesthesia Plan  ASA: 2  Anesthesia Plan: General   Post-op Pain Management: Dilaudid IV   Induction: Intravenous  PONV Risk Score and Plan: 4 or greater and Ondansetron and Dexamethasone  Airway Management Planned: LMA and Oral ETT  Additional Equipment:   Intra-op Plan:   Post-operative Plan: Extubation in OR  Informed Consent: I have reviewed the patients History and  Physical, chart, labs and discussed the procedure including the risks, benefits and alternatives for the proposed anesthesia with the patient or authorized representative who has indicated his/her understanding and acceptance.       Plan Discussed with: CRNA and Surgeon  Anesthesia Plan Comments:         Anesthesia Quick Evaluation

## 2021-09-07 NOTE — Transfer of Care (Signed)
Immediate Anesthesia Transfer of Care Note  Patient: Chad Butler [facility]  Procedure(s) Performed: URETEROSCOPY-DIAGNOSTIC (Left: Ureter) CYSTOSCOPY WITH RETROGRADE PYELOGRAM (Left: Ureter)  Patient Location: PACU  Anesthesia Type:General  Level of Consciousness: awake, alert , oriented and patient cooperative  Airway & Oxygen Therapy: Patient Spontanous Breathing  Post-op Assessment: Report given to RN, Post -op Vital signs reviewed and stable and Patient moving all extremities X 4  Post vital signs: Reviewed and stable  Last Vitals:  Vitals Value Taken Time  BP    Temp    Pulse 81 09/07/21 1150  Resp 14 09/07/21 1150  SpO2 95 % 09/07/21 1150  Vitals shown include unvalidated device data.  Last Pain:  Vitals:   09/07/21 0907  TempSrc: Oral  PainSc: 0-No pain         Complications: No notable events documented.

## 2021-09-08 ENCOUNTER — Encounter (HOSPITAL_COMMUNITY): Payer: Self-pay | Admitting: Urology

## 2021-09-15 ENCOUNTER — Ambulatory Visit: Payer: Medicare Other | Admitting: Urology

## 2021-09-27 DIAGNOSIS — E663 Overweight: Secondary | ICD-10-CM | POA: Diagnosis not present

## 2021-09-27 DIAGNOSIS — Z6822 Body mass index (BMI) 22.0-22.9, adult: Secondary | ICD-10-CM | POA: Diagnosis not present

## 2021-09-27 DIAGNOSIS — R448 Other symptoms and signs involving general sensations and perceptions: Secondary | ICD-10-CM | POA: Diagnosis not present

## 2021-09-27 DIAGNOSIS — R5383 Other fatigue: Secondary | ICD-10-CM | POA: Diagnosis not present

## 2021-09-27 DIAGNOSIS — R112 Nausea with vomiting, unspecified: Secondary | ICD-10-CM | POA: Diagnosis not present

## 2021-09-28 ENCOUNTER — Encounter: Payer: Self-pay | Admitting: Internal Medicine

## 2021-10-26 ENCOUNTER — Encounter: Payer: Self-pay | Admitting: Internal Medicine

## 2021-10-26 ENCOUNTER — Ambulatory Visit (INDEPENDENT_AMBULATORY_CARE_PROVIDER_SITE_OTHER): Payer: Medicare Other | Admitting: Internal Medicine

## 2021-10-26 ENCOUNTER — Other Ambulatory Visit: Payer: Self-pay

## 2021-10-26 VITALS — BP 129/77 | HR 88 | Temp 97.1°F | Ht 73.0 in | Wt 160.0 lb

## 2021-10-26 DIAGNOSIS — D126 Benign neoplasm of colon, unspecified: Secondary | ICD-10-CM

## 2021-10-26 DIAGNOSIS — R112 Nausea with vomiting, unspecified: Secondary | ICD-10-CM | POA: Diagnosis not present

## 2021-10-26 DIAGNOSIS — K219 Gastro-esophageal reflux disease without esophagitis: Secondary | ICD-10-CM | POA: Diagnosis not present

## 2021-10-26 DIAGNOSIS — K5900 Constipation, unspecified: Secondary | ICD-10-CM | POA: Diagnosis not present

## 2021-10-26 MED ORDER — ONDANSETRON HCL 4 MG PO TABS: 4.0000 mg | ORAL_TABLET | Freq: Three times a day (TID) | ORAL | 2 refills | Status: DC | PRN

## 2021-10-26 NOTE — Patient Instructions (Addendum)
I will send in a medication to your pharmacy called Ondansetron for nausea.  You can take this medication every 8 hours as needed.  It may cause drowsiness.  For your constipation, I want you to start taking over the counter MiraLAX 1 capful daily.  If this does not adequately control your constipation, I would increase to 2 capfuls daily.  If this is still not adequate, then I would add on once daily Dulcolax (bisacodyl) tablet.   I also recommend increasing fiber in your diet or adding OTC Benefiber/Metamucil. Be sure to drink at least 4 to 6 glasses of water daily.   Follow-up with GI in 6 to 8 weeks.  We will reevaluate at that time and hopefully you are improved and we can schedule colonoscopy at that time.  It was very nice meeting you today.  Dr. Abbey Chatters  At [facility] we value your feedback. You may receive a survey about your visit today. Please share your experience as we strive to create trusting relationships with our patients to provide genuine, compassionate, quality care.  We appreciate your understanding and patience as we review any laboratory studies, imaging, and other diagnostic tests that are ordered as we care for you. Our office policy is 5 business days for review of these results, and any emergent or urgent results are addressed in a timely manner for your best interest. If you do not hear from our office in 1 week, please contact us.   We also encourage the use of MyChart, which contains your medical information for your review as well. If you are not enrolled in this feature, an access code is on this after visit summary for your convenience. Thank you for allowing [place] to be involved in your care.  It was great to see you today!  I hope you have a great rest of your Winter!    Chad Butler. Abbey Chatters, D.O. Gastroenterology and Hepatology [facility]

## 2021-10-26 NOTE — Progress Notes (Signed)
Primary Care Physician:  Redmond School, MD Primary Gastroenterologist:  Dr. Abbey Chatters  Chief Complaint  Patient presents with   Vomiting    Started 09/15/21, last episdode 10/18/21. Having to force self to eat. Has lost 7 lbs.    HPI:   Chad Butler is a 71 y.o. male who presents to clinic today by referral from his PCP Dr. Riley Kill for evaluation.  Patient states since middle of December he has had recurrent nausea and vomiting.  Occurs every few days after he eats.   Recent diagnosis of recurrent bladder cancer status post treatment with TURBT and gemcitabine.  Denies any dysphagia odynophagia.  Does have chronic reflux which is well controlled on omeprazole daily.  No epigastric or chest pain.  No melena hematochezia.  Also notes chronic constipation.  States she will have a bowel movement every 2 to 3 days.  Notes associated straining.  No rectal pain or discomfort.  Feels as though he will need to have a BM and then cannot.  Last colonoscopy 2013 with multiple small tubular adenomas removed recommended 10-year recall.  Today he states he has been 10 days without having any nausea or vomiting.  Feels as though he is starting to feel better finally.  Past Medical History:  Diagnosis Date   Anxiety    Bladder cancer ([place])    Cancer ([place])    Depression    Hemorrhoids    Hypertension     Past Surgical History:  Procedure Laterality Date   ADRENALECTOMY     [facility]   CHOLECYSTECTOMY  2001   COLONOSCOPY  11/09/2011   Dr. Oneida Alar: two tubular adenomas, mild diverticulosis, internal hemorrhoids, surveillance in 10 years    CYST EXCISION  1970's   back   CYSTOSCOPY W/ RETROGRADES Bilateral 08/17/2021   Procedure: CYSTOSCOPY WITH RETROGRADE PYELOGRAM;  Surgeon: Cleon Gustin, MD;  Location: [facility];  Service: Urology;  Laterality: Bilateral;   CYSTOSCOPY W/ RETROGRADES Left 09/07/2021   Procedure: CYSTOSCOPY WITH RETROGRADE PYELOGRAM;  Surgeon: Cleon Gustin, MD;  Location:  [facility];  Service: Urology;  Laterality: Left;   ESOPHAGOGASTRODUODENOSCOPY  10/07/2012   Dr. Arnoldo Morale: normal, Clotest negative   HERNIA REPAIR  1956   INGUINAL HERNIA REPAIR Right 11/04/2013   Procedure: RECURRENT RIGHT INGUINAL HERNIA REPAIR;  Surgeon: Jamesetta So, MD;  Location: [facility];  Service: General;  Laterality: Right;   Left shoulder surgery  2011   LESION REMOVAL N/A 11/04/2013   Procedure: EXCISION OF SKIN LESION ABDOMINAL WALL;  Surgeon: Jamesetta So, MD;  Location: [facility];  Service: General;  Laterality: N/A;   TRANSURETHRAL RESECTION OF BLADDER TUMOR N/A 08/17/2021   Procedure: TRANSURETHRAL RESECTION OF BLADDER TUMOR (TURBT);  Surgeon: Cleon Gustin, MD;  Location: [facility];  Service: Urology;  Laterality: N/A;   URETEROSCOPY Left 09/07/2021   Procedure: URETEROSCOPY-DIAGNOSTIC;  Surgeon: Cleon Gustin, MD;  Location: [facility];  Service: Urology;  Laterality: Left;    Current Outpatient Medications  Medication Sig Dispense Refill   ALPRAZolam (XANAX) 0.5 MG tablet Take 0.5 mg by mouth at bedtime. For sleep.     Chlorpheniramine-Phenylephrine (SINUS/ALLERGY PE PO) Take 1 tablet by mouth daily as needed (allergies).     diphenhydrAMINE (BENADRYL) 25 MG tablet Take 25 mg by mouth daily as needed for allergies.     doxylamine, Sleep, (UNISOM) 25 MG tablet Take 12.5 mg by mouth at bedtime as needed for sleep.     ibuprofen (ADVIL) 200  MG tablet Take 200 mg by mouth every 6 (six) hours as needed for headache or moderate pain.     Multiple Vitamin (MULTIVITAMIN WITH MINERALS) TABS tablet Take 1 tablet by mouth daily.     omeprazole (PRILOSEC) 20 MG capsule Take 20 mg by mouth daily.     ondansetron (ZOFRAN) 4 MG tablet Take 1 tablet (4 mg total) by mouth every 8 (eight) hours as needed for nausea or vomiting. 30 tablet 2   oxyCODONE-acetaminophen (PERCOCET) 5-325 MG tablet Take 1 tablet by mouth every 4 (four) hours as needed for severe pain. (Patient taking differently:  Take 0.5 tablets by mouth every 4 (four) hours as needed for severe pain.) 15 tablet 0   rOPINIRole (REQUIP) 0.25 MG tablet Take 0.5 mg by mouth daily.     silodosin (RAPAFLO) 8 MG CAPS capsule Take 1 capsule (8 mg total) by mouth daily with breakfast. 30 capsule 11   tamsulosin (FLOMAX) 0.4 MG CAPS capsule Take 0.4 mg by mouth daily.     sulfamethoxazole-trimethoprim (BACTRIM DS) 800-160 MG tablet Take 1 tablet by mouth 2 (two) times daily. (Patient not taking: Reported on 10/26/2021) 6 tablet 0   No current facility-administered medications for this visit.    Allergies as of 10/26/2021 - Review Complete 10/26/2021  Allergen Reaction Noted   Red dye Nausea And Vomiting 11/17/2014   Gadolinium Nausea And Vomiting 04/18/2010    Family History  Problem Relation Age of Onset   Hypertension Mother    Cancer Father    Colon cancer Neg Hx    Diabetes Neg Hx    Thyroid disease Neg Hx     Social History   Socioeconomic History   Marital status: Widowed    Spouse name: Not on file   Number of children: Not on file   Years of education: Not on file   Highest education level: Not on file  Occupational History   Occupation: Web designer  Tobacco Use   Smoking status: Never   Smokeless tobacco: Never  Vaping Use   Vaping Use: Never used  Substance and Sexual Activity   Alcohol use: Yes    Comment: 2 oz wine some days   Drug use: No   Sexual activity: Not on file  Other Topics Concern   Not on file  Social History Narrative   Not on file   Social Determinants of Health   Financial Resource Strain: Not on file  Food Insecurity: Not on file  Transportation Needs: Not on file  Physical Activity: Not on file  Stress: Not on file  Social Connections: Not on file  Intimate Partner Violence: Not on file    Subjective: Review of Systems  Constitutional:  Negative for chills and fever.  HENT:  Negative for congestion and hearing loss.   Eyes:  Negative for blurred  vision and double vision.  Respiratory:  Negative for cough and shortness of breath.   Cardiovascular:  Negative for chest pain and palpitations.  Gastrointestinal:  Positive for constipation, nausea and vomiting. Negative for abdominal pain, blood in stool, diarrhea, heartburn and melena.  Genitourinary:  Negative for dysuria and urgency.  Musculoskeletal:  Negative for joint pain and myalgias.  Skin:  Negative for itching and rash.  Neurological:  Negative for dizziness and headaches.  Psychiatric/Behavioral:  Negative for depression. The patient is not nervous/anxious.       Objective: BP 129/77    Pulse 88    Temp (!) 97.1 F (36.2 C) (Temporal)  Ht 6\' 1"  (1.854 m)    Wt 160 lb (72.6 kg)    BMI 21.11 kg/m  Physical Exam Constitutional:      Appearance: Normal appearance.  HENT:     Head: Normocephalic and atraumatic.  Eyes:     Extraocular Movements: Extraocular movements intact.     Conjunctiva/sclera: Conjunctivae normal.  Cardiovascular:     Rate and Rhythm: Normal rate and regular rhythm.  Pulmonary:     Effort: Pulmonary effort is normal.     Breath sounds: Normal breath sounds.  Abdominal:     General: Bowel sounds are normal.     Palpations: Abdomen is soft.  Musculoskeletal:        General: Normal range of motion.     Cervical back: Normal range of motion and neck supple.  Skin:    General: Skin is warm.  Neurological:     General: No focal deficit present.     Mental Status: He is alert and oriented to person, place, and time.  Psychiatric:        Mood and Affect: Mood normal.        Behavior: Behavior normal.     Assessment: *Nausea and vomiting *Chronic GERD-well-controlled on omeprazole *Constipation *Adenomatous colon polyps  Plan: Etiology of patient's nausea and vomiting unclear.  Possibly related to treatment from his recent bladder cancer including gemcitabine.  States for the last 10 days his symptoms have improved.  We will continue to  monitor for now.  We will send in ondansetron to take as needed if his symptoms recur.  For his constipation, I recommended he start taking MiraLAX 1 capful daily.  If this is not adequate he can increase to 2 capfuls daily.  If this is still not adequate I recommend add on once daily Dulcolax as well.  Follow-up with GI in 6 to 8 weeks.  Hopefully symptoms continue to prove and we can schedule colonoscopy for surveillance purposes.  Do not think he would tolerate prep at present time.  If symptoms not improved, may need to consider EGD to further evaluate  10/26/2021 9:05 AM   Disclaimer: This note was dictated with voice recognition software. Similar sounding words can inadvertently be transcribed and may not be corrected upon review.

## 2021-11-02 ENCOUNTER — Ambulatory Visit: Payer: Medicare Other | Admitting: Internal Medicine

## 2021-11-07 ENCOUNTER — Other Ambulatory Visit: Payer: Medicare Other | Admitting: Urology

## 2021-11-08 ENCOUNTER — Encounter: Payer: Self-pay | Admitting: *Deleted

## 2021-11-14 ENCOUNTER — Encounter: Payer: Self-pay | Admitting: Urology

## 2021-11-14 ENCOUNTER — Other Ambulatory Visit: Payer: Self-pay

## 2021-11-14 ENCOUNTER — Ambulatory Visit (INDEPENDENT_AMBULATORY_CARE_PROVIDER_SITE_OTHER): Payer: Medicare Other | Admitting: Urology

## 2021-11-14 VITALS — BP 131/54 | HR 101 | Wt 159.4 lb

## 2021-11-14 DIAGNOSIS — N1330 Unspecified hydronephrosis: Secondary | ICD-10-CM | POA: Diagnosis not present

## 2021-11-14 DIAGNOSIS — D090 Carcinoma in situ of bladder: Secondary | ICD-10-CM | POA: Diagnosis not present

## 2021-11-14 DIAGNOSIS — R053 Chronic cough: Secondary | ICD-10-CM | POA: Diagnosis not present

## 2021-11-14 DIAGNOSIS — R8281 Pyuria: Secondary | ICD-10-CM | POA: Diagnosis not present

## 2021-11-14 DIAGNOSIS — C678 Malignant neoplasm of overlapping sites of bladder: Secondary | ICD-10-CM

## 2021-11-14 NOTE — Final Progress Note (Signed)
History of Present Illness: Follow-up in this male who has had 2 fairly recent procedures by Dr. Alyson Ingles.  08/17/2021-cystoscopy, bilateral retrograde pyelograms, transurethral resection of bladder tumors.  There was left hydronephrosis.  His left kidney was stented.  Pathology revealed carcinoma in situ.  09/07/2021-cystoscopy, left retrograde pyelogram, left ureteroscopy.  No point of obstruction or stone seen.  No stent was left in.  2.14.2023: He comes in today for follow-up.  He complains about weight loss over the past few months, chronic cough x2 months, nausea, emesis, cold intolerance.  He has not had any follow-up or treatment for his carcinoma in situ.  Preoperative CT scan without contrast revealed no evidence of extravesical disease.  Past Medical History:  Diagnosis Date   Anxiety    Bladder cancer ([place])    Cancer ([place])    Depression    Hemorrhoids    Hypertension     Past Surgical History:  Procedure Laterality Date   AD[facility]Y     [facility]   CHOLECYSTECTOMY  2001   COLONOSCOPY  11/09/2011   Dr. Oneida Alar: two tubular adenomas, mild diverticulosis, internal hemorrhoids, surveillance in 10 years    CYST EXCISION  1970's   back   CYSTOSCOPY W/ RETROGRADES Bilateral 08/17/2021   Procedure: CYSTOSCOPY WITH RETROGRADE PYELOGRAM;  Surgeon: Cleon Gustin, MD;  Location: [facility];  Service: Urology;  Laterality: Bilateral;   CYSTOSCOPY W/ RETROGRADES Left 09/07/2021   Procedure: CYSTOSCOPY WITH RETROGRADE PYELOGRAM;  Surgeon: Cleon Gustin, MD;  Location: [facility];  Service: Urology;  Laterality: Left;   ESOPHAGOGASTRODUODENOSCOPY  10/07/2012   Dr. Arnoldo Morale: normal, Clotest negative   HERNIA REPAIR  1956   INGUINAL HERNIA REPAIR Right 11/04/2013   Procedure: RECURRENT RIGHT INGUINAL HERNIA REPAIR;  Surgeon: Jamesetta So, MD;  Location: [facility];  Service: General;  Laterality: Right;   Left shoulder surgery  2011   LESION REMOVAL N/A 11/04/2013   Procedure:  EXCISION OF SKIN LESION ABDOMINAL WALL;  Surgeon: Jamesetta So, MD;  Location: [facility];  Service: General;  Laterality: N/A;   TRANSURETHRAL RESECTION OF BLADDER TUMOR N/A 08/17/2021   Procedure: TRANSURETHRAL RESECTION OF BLADDER TUMOR (TURBT);  Surgeon: Cleon Gustin, MD;  Location: [facility];  Service: Urology;  Laterality: N/A;   URETEROSCOPY Left 09/07/2021   Procedure: URETEROSCOPY-DIAGNOSTIC;  Surgeon: Cleon Gustin, MD;  Location: [facility];  Service: Urology;  Laterality: Left;    Home Medications:  Allergies as of 11/14/2021       Reactions   Red Dye Nausea And Vomiting   Gadolinium Nausea And Vomiting    Code: VOM, with injection, no other reactions noted, Onset Date: [id number]        Medication List        Accurate as of November 14, 2021 12:29 PM. If you have any questions, ask your nurse or doctor.          ALPRAZolam 0.5 MG tablet Commonly known as: XANAX Take 0.5 mg by mouth at bedtime. For sleep.   diphenhydrAMINE 25 MG tablet Commonly known as: BENADRYL Take 25 mg by mouth daily as needed for allergies.   doxylamine (Sleep) 25 MG tablet Commonly known as: UNISOM Take 12.5 mg by mouth at bedtime as needed for sleep.   ibuprofen 200 MG tablet Commonly known as: ADVIL Take 200 mg by mouth every 6 (six) hours as needed for headache or moderate pain.   multivitamin with minerals Tabs tablet Take 1 tablet by mouth daily.  omeprazole 20 MG capsule Commonly known as: PRILOSEC Take 20 mg by mouth daily.   ondansetron 4 MG tablet Commonly known as: ZOFRAN Take 1 tablet (4 mg total) by mouth every 8 (eight) hours as needed for nausea or vomiting.   oxyCODONE-acetaminophen 5-325 MG tablet Commonly known as: Percocet Take 1 tablet by mouth every 4 (four) hours as needed for severe pain. What changed: how much to take   rOPINIRole 0.25 MG tablet Commonly known as: REQUIP Take 0.5 mg by mouth daily.   silodosin 8 MG Caps capsule Commonly known  as: RAPAFLO Take 1 capsule (8 mg total) by mouth daily with breakfast.   SINUS/ALLERGY PE PO Take 1 tablet by mouth daily as needed (allergies).   sulfamethoxazole-trimethoprim 800-160 MG tablet Commonly known as: BACTRIM DS Take 1 tablet by mouth 2 (two) times daily.   tamsulosin 0.4 MG Caps capsule Commonly known as: FLOMAX Take 0.4 mg by mouth daily.        Allergies:  Allergies  Allergen Reactions   Red Dye Nausea And Vomiting   Gadolinium Nausea And Vomiting     Code: VOM, with injection, no other reactions noted, Onset Date: [id number]     Family History  Problem Relation Age of Onset   Hypertension Mother    Cancer Father    Colon cancer Neg Hx    Diabetes Neg Hx    Thyroid disease Neg Hx     Social History:  reports that he has never smoked. He has never used smokeless tobacco. He reports current alcohol use. He reports that he does not use drugs.  ROS: A complete review of systems was performed.  All systems are negative except for pertinent findings as noted.  Physical Exam:  Vital signs in last 24 hours: BP (!) 131/54    Pulse (!) 101    Wt 159 lb 6.4 oz (72.3 kg)    BMI 21.03 kg/m  Constitutional:  Alert and oriented, No acute distress Cardiovascular: Regular rate  Respiratory: Normal respiratory effort.  He is coughing  Neurologic: Grossly intact, no focal deficits Psychiatric: Normal mood and affect  I have reviewed prior pt notes  I have reviewed notes from previous physicians  I have reviewed urinalysis results  I have independently reviewed prior imaging-CT results Pathology results reviewed with patient   Impression/Assessment:  1.  Carcinoma in situ/recurrent carcinoma of the bladder.  Status post TURBT 3 months ago.  He has had no adjuvant treatment yet.  2.  Left hydronephrosis without evident obstructive ureteral segment, he has not had follow-up for this  3.  Pyuria  Plan:  1.  Urine was cultured no antibiotics administered  yet  2.  I sent him out for renal ultrasound to follow-up hydronephrosis as well as chest x-ray  3.  I recommended that he follow-up with GI as well as his primary care providers for his weight loss as well as his chronic cough  4.  I will have him come back in the near future to start 6 weeks of BCG treatment as well as 3 months for cystoscopy

## 2021-11-15 LAB — URINALYSIS, ROUTINE W REFLEX MICROSCOPIC
Bilirubin, UA: NEGATIVE
Glucose, UA: NEGATIVE
Ketones, UA: NEGATIVE
Nitrite, UA: NEGATIVE
Specific Gravity, UA: 1.02 (ref 1.005–1.030)
Urobilinogen, Ur: 0.2 mg/dL (ref 0.2–1.0)
pH, UA: 6.5 (ref 5.0–7.5)

## 2021-11-15 LAB — MICROSCOPIC EXAMINATION
Renal Epithel, UA: NONE SEEN /hpf
WBC, UA: >30 /hpf — AB (ref 0–5)

## 2021-11-16 ENCOUNTER — Other Ambulatory Visit: Payer: Self-pay

## 2021-11-16 ENCOUNTER — Other Ambulatory Visit (HOSPITAL_COMMUNITY): Payer: Self-pay | Admitting: Family Medicine

## 2021-11-16 ENCOUNTER — Ambulatory Visit (HOSPITAL_COMMUNITY)
Admission: RE | Admit: 2021-11-16 | Discharge: 2021-11-16 | Disposition: A | Discharge status: Home / Self Care | Payer: Medicare Other | Source: Ambulatory Visit | Attending: Family Medicine | Admitting: Family Medicine

## 2021-11-16 DIAGNOSIS — Z6820 Body mass index (BMI) 20.0-20.9, adult: Secondary | ICD-10-CM | POA: Diagnosis not present

## 2021-11-16 DIAGNOSIS — C679 Malignant neoplasm of bladder, unspecified: Secondary | ICD-10-CM

## 2021-11-16 DIAGNOSIS — R0602 Shortness of breath: Secondary | ICD-10-CM | POA: Diagnosis not present

## 2021-11-16 DIAGNOSIS — R059 Cough, unspecified: Secondary | ICD-10-CM

## 2021-11-16 NOTE — Progress Notes (Signed)
No note on this page

## 2021-11-17 ENCOUNTER — Telehealth: Payer: Self-pay

## 2021-11-17 ENCOUNTER — Other Ambulatory Visit: Payer: Self-pay

## 2021-11-17 DIAGNOSIS — R8281 Pyuria: Secondary | ICD-10-CM

## 2021-11-17 LAB — URINE CULTURE

## 2021-11-17 MED ORDER — SULFAMETHOXAZOLE-TRIMETHOPRIM 800-160 MG PO TABS: 1.0000 | ORAL_TABLET | Freq: Two times a day (BID) | ORAL | 0 refills | Status: DC

## 2021-11-17 NOTE — Telephone Encounter (Signed)
Reviewed urine culture with Dr. Alyson Ingles.  Bactrim DS prescription sent to pharmacy. Patient called and notified.

## 2021-11-20 ENCOUNTER — Ambulatory Visit (HOSPITAL_COMMUNITY)
Admission: RE | Admit: 2021-11-20 | Discharge: 2021-11-20 | Disposition: A | Discharge status: Home / Self Care | Payer: Medicare Other | Source: Ambulatory Visit | Attending: Urology | Admitting: Urology

## 2021-11-20 ENCOUNTER — Other Ambulatory Visit: Payer: Self-pay

## 2021-11-20 DIAGNOSIS — C678 Malignant neoplasm of overlapping sites of bladder: Secondary | ICD-10-CM | POA: Diagnosis not present

## 2021-11-20 DIAGNOSIS — N1330 Unspecified hydronephrosis: Secondary | ICD-10-CM | POA: Diagnosis not present

## 2021-11-21 NOTE — Progress Notes (Signed)
Sent via mail 

## 2021-11-27 ENCOUNTER — Telehealth: Payer: Self-pay

## 2021-11-27 NOTE — Telephone Encounter (Signed)
Patient calling for u/s results.  Message sent to MD.

## 2021-11-28 ENCOUNTER — Other Ambulatory Visit: Payer: Self-pay | Admitting: Urology

## 2021-11-28 ENCOUNTER — Telehealth: Payer: Self-pay

## 2021-11-28 DIAGNOSIS — N1339 Other hydronephrosis: Secondary | ICD-10-CM

## 2021-11-28 NOTE — Telephone Encounter (Signed)
-----   Message from Franchot Gallo, MD sent at 11/28/2021  9:48 AM EST ----- Please call pt--us revealed both kidneys appear to be poorly draining.  I put an order in for bmp as well as ct to follouwp ----- Message ----- From: Iris Pert, LPN Sent: 11/10/3126   8:29 AM EST To: Franchot Gallo, MD, Cleon Gustin, MD  Can you please review this in Dr. Alan Ripper absence.

## 2021-11-28 NOTE — Telephone Encounter (Signed)
See other telephone encounter.

## 2021-11-30 NOTE — Telephone Encounter (Signed)
Patient called and made aware.

## 2021-12-01 NOTE — Telephone Encounter (Signed)
3/21 for CT ?

## 2021-12-07 ENCOUNTER — Encounter: Payer: Self-pay | Admitting: Internal Medicine

## 2021-12-15 ENCOUNTER — Other Ambulatory Visit: Payer: Medicare Other

## 2021-12-15 ENCOUNTER — Other Ambulatory Visit: Payer: Self-pay

## 2021-12-15 DIAGNOSIS — N1339 Other hydronephrosis: Secondary | ICD-10-CM | POA: Diagnosis not present

## 2021-12-15 NOTE — Patient Instructions (Signed)

## 2021-12-16 LAB — BASIC METABOLIC PANEL
BUN/Creatinine Ratio: 13 (ref 10–24)
BUN: 16 mg/dL (ref 8–27)
CO2: 25 mmol/L (ref 20–29)
Calcium: 9.4 mg/dL (ref 8.6–10.2)
Chloride: 102 mmol/L (ref 96–106)
Creatinine, Ser: 1.19 mg/dL (ref 0.76–1.27)
Glucose: 86 mg/dL (ref 70–99)
Potassium: 4.2 mmol/L (ref 3.5–5.2)
Sodium: 139 mmol/L (ref 134–144)
eGFR: 65 mL/min/{1.73_m2} (ref >59)

## 2021-12-19 ENCOUNTER — Ambulatory Visit (INDEPENDENT_AMBULATORY_CARE_PROVIDER_SITE_OTHER): Payer: Medicare Other | Admitting: Urology

## 2021-12-19 ENCOUNTER — Encounter: Payer: Self-pay | Admitting: Urology

## 2021-12-19 ENCOUNTER — Ambulatory Visit (HOSPITAL_COMMUNITY)
Admission: RE | Admit: 2021-12-19 | Discharge: 2021-12-19 | Disposition: A | Discharge status: Home / Self Care | Payer: Medicare Other | Source: Ambulatory Visit | Attending: Urology | Admitting: Urology

## 2021-12-19 ENCOUNTER — Other Ambulatory Visit: Payer: Self-pay

## 2021-12-19 ENCOUNTER — Other Ambulatory Visit: Payer: Self-pay | Admitting: Urology

## 2021-12-19 DIAGNOSIS — R310 Gross hematuria: Secondary | ICD-10-CM

## 2021-12-19 DIAGNOSIS — C679 Malignant neoplasm of bladder, unspecified: Secondary | ICD-10-CM | POA: Diagnosis not present

## 2021-12-19 DIAGNOSIS — K5900 Constipation, unspecified: Secondary | ICD-10-CM | POA: Diagnosis not present

## 2021-12-19 DIAGNOSIS — N3001 Acute cystitis with hematuria: Secondary | ICD-10-CM

## 2021-12-19 DIAGNOSIS — N1339 Other hydronephrosis: Secondary | ICD-10-CM | POA: Insufficient documentation

## 2021-12-19 DIAGNOSIS — K449 Diaphragmatic hernia without obstruction or gangrene: Secondary | ICD-10-CM | POA: Diagnosis not present

## 2021-12-19 DIAGNOSIS — N1330 Unspecified hydronephrosis: Secondary | ICD-10-CM

## 2021-12-19 DIAGNOSIS — R339 Retention of urine, unspecified: Secondary | ICD-10-CM | POA: Diagnosis not present

## 2021-12-19 DIAGNOSIS — D090 Carcinoma in situ of bladder: Secondary | ICD-10-CM | POA: Diagnosis not present

## 2021-12-19 DIAGNOSIS — C678 Malignant neoplasm of overlapping sites of bladder: Secondary | ICD-10-CM | POA: Diagnosis not present

## 2021-12-19 LAB — MICROSCOPIC EXAMINATION
Renal Epithel, UA: NONE SEEN /hpf
WBC, UA: >30 /hpf — AB (ref 0–5)

## 2021-12-19 LAB — URINALYSIS, ROUTINE W REFLEX MICROSCOPIC
Bilirubin, UA: NEGATIVE
Glucose, UA: NEGATIVE
Ketones, UA: NEGATIVE
Nitrite, UA: NEGATIVE
Specific Gravity, UA: 1.015 (ref 1.005–1.030)
Urobilinogen, Ur: 0.2 mg/dL (ref 0.2–1.0)
pH, UA: 6 (ref 5.0–7.5)

## 2021-12-19 MED ORDER — BCG LIVE 50 MG IS SUSR: 3.2400 mL | Freq: Once | INTRAVESICAL | Status: DC

## 2021-12-19 MED ORDER — AMOXICILLIN-POT CLAVULANATE 875-125 MG PO TABS: 1.0000 | ORAL_TABLET | Freq: Two times a day (BID) | ORAL | 0 refills | Status: DC

## 2021-12-19 NOTE — Progress Notes (Signed)
H&P ? ?Chief Complaint: Here for follow-up of carcinoma in situ/hydronephrosis ? ?History of Present Illness: Since last ? ?08/17/2021-cystoscopy, bilateral retrograde pyelograms, transurethral resection of bladder tumors.  There was left hydronephrosis.  His left kidney was stented.  Pathology revealed carcinoma in situ. ? ?09/07/2021-cystoscopy, left retrograde pyelogram, left ureteroscopy.  No point of obstruction or stone seen.  No stent was left in. ? ?2.14.2023: He comes in today for follow-up.  He complains about weight loss over the past few months, chronic cough x2 months, nausea, emesis, cold intolerance.  He has not had any follow-up or treatment for his carcinoma in situ.  Preoperative CT scan without contrast revealed no evidence of extravesical disease.  Urine culture at this visit grew Serratia, he was treated with sulfa. ? ?3.21.2023: Recent basic metabolic panel revealed a creatinine of 1.19 which is a bit higher than his baseline.  He did have a CT scan this morning.  He still has decreased appetite.  He has had no fever.  He does have mild amount of blood in urine. ? ?Past Medical History:  ?Diagnosis Date  ? Anxiety   ? Bladder cancer ([place])   ? Cancer [facility])   ? Depression   ? Hemorrhoids   ? Hypertension   ? ? ?Past Surgical History:  ?Procedure Laterality Date  ? ADRENALECTOMY    ? [facility]  ? CHOLECYSTECTOMY  2001  ? COLONOSCOPY  11/09/2011  ? Dr. Oneida Alar: two tubular adenomas, mild diverticulosis, internal hemorrhoids, surveillance in 10 years   ? CYST EXCISION  1970's  ? back  ? CYSTOSCOPY W/ RETROGRADES Bilateral 08/17/2021  ? Procedure: CYSTOSCOPY WITH RETROGRADE PYELOGRAM;  Surgeon: Cleon Gustin, MD;  Location: [facility];  Service: Urology;  Laterality: Bilateral;  ? CYSTOSCOPY W/ RETROGRADES Left 09/07/2021  ? Procedure: CYSTOSCOPY WITH RETROGRADE PYELOGRAM;  Surgeon: Cleon Gustin, MD;  Location: [facility];  Service: Urology;  Laterality: Left;  ? ESOPHAGOGASTRODUODENOSCOPY  10/07/2012   ? Dr. Arnoldo Morale: normal, Clotest negative  ? HERNIA REPAIR  1956  ? INGUINAL HERNIA REPAIR Right 11/04/2013  ? Procedure: RECURRENT RIGHT INGUINAL HERNIA REPAIR;  Surgeon: Jamesetta So, MD;  Location: [facility];  Service: General;  Laterality: Right;  ? Left shoulder surgery  2011  ? LESION REMOVAL N/A 11/04/2013  ? Procedure: EXCISION OF SKIN LESION ABDOMINAL WALL;  Surgeon: Jamesetta So, MD;  Location: [facility];  Service: General;  Laterality: N/A;  ? TRANSURETHRAL RESECTION OF BLADDER TUMOR N/A 08/17/2021  ? Procedure: TRANSURETHRAL RESECTION OF BLADDER TUMOR (TURBT);  Surgeon: Cleon Gustin, MD;  Location: [facility];  Service: Urology;  Laterality: N/A;  ? URETEROSCOPY Left 09/07/2021  ? Procedure: URETEROSCOPY-DIAGNOSTIC;  Surgeon: Cleon Gustin, MD;  Location: [facility];  Service: Urology;  Laterality: Left;  ? ? ?Home Medications:  ?Allergies as of 12/19/2021   ? ?   Reactions  ? Red Dye Nausea And Vomiting  ? Gadolinium Nausea And Vomiting  ?  Code: VOM, with injection, no other reactions noted, Onset Date: [id number]  ? ?  ? ?  ?Medication List  ?  ? ?  ? Accurate as of December 19, 2021 11:52 AM. If you have any questions, ask your nurse or doctor.  ?  ?  ? ?  ? ?ALPRAZolam 0.5 MG tablet ?Commonly known as: Duanne Moron ?Take 0.5 mg by mouth at bedtime. For sleep. ?  ?amoxicillin-clavulanate 875-125 MG tablet ?Commonly known as: AUGMENTIN ?Take 1 tablet by mouth every 12 (twelve) hours. ?Started  by: Jorja Loa, MD ?  ?diphenhydrAMINE 25 MG tablet ?Commonly known as: BENADRYL ?Take 25 mg by mouth daily as needed for allergies. ?  ?doxylamine (Sleep) 25 MG tablet ?Commonly known as: UNISOM ?Take 12.5 mg by mouth at bedtime as needed for sleep. ?  ?ibuprofen 200 MG tablet ?Commonly known as: ADVIL ?Take 200 mg by mouth every 6 (six) hours as needed for headache or moderate pain. ?  ?multivitamin with minerals Tabs tablet ?Take 1 tablet by mouth daily. ?  ?omeprazole 20 MG capsule ?Commonly known as:  PRILOSEC ?Take 20 mg by mouth daily. ?  ?ondansetron 4 MG tablet ?Commonly known as: ZOFRAN ?Take 1 tablet (4 mg total) by mouth every 8 (eight) hours as needed for nausea or vomiting. ?  ?oxyCODONE-acetaminophen 5-325 MG tablet ?Commonly known as: Percocet ?Take 1 tablet by mouth every 4 (four) hours as needed for severe pain. ?What changed: how much to take ?  ?rOPINIRole 0.25 MG tablet ?Commonly known as: REQUIP ?Take 0.5 mg by mouth daily. ?  ?silodosin 8 MG Caps capsule ?Commonly known as: RAPAFLO ?Take 1 capsule (8 mg total) by mouth daily with breakfast. ?  ?SINUS/ALLERGY PE PO ?Take 1 tablet by mouth daily as needed (allergies). ?  ?sulfamethoxazole-trimethoprim 800-160 MG tablet ?Commonly known as: BACTRIM DS ?Take 1 tablet by mouth every 12 (twelve) hours. ?  ?tamsulosin 0.4 MG Caps capsule ?Commonly known as: FLOMAX ?Take 0.4 mg by mouth daily. ?  ? ?  ? ? ?Allergies:  ?Allergies  ?Allergen Reactions  ? Red Dye Nausea And Vomiting  ? Gadolinium Nausea And Vomiting  ?   Code: VOM, with injection, no other reactions noted, Onset Date: 09233007 ?  ? ? ?Family History  ?Problem Relation Age of Onset  ? Hypertension Mother   ? Cancer Father   ? Colon cancer Neg Hx   ? Diabetes Neg Hx   ? Thyroid disease Neg Hx   ? ? ?Social History:  reports that he has never smoked. He has never used smokeless tobacco. He reports current alcohol use. He reports that he does not use drugs. ? ?ROS: ?A complete review of systems was performed.  All systems are negative except for pertinent findings as noted. ? ?Physical Exam:  ?Vital signs in last 24 hours: ?There were no vitals taken for this visit. ?Constitutional:  Alert and oriented, No acute distress ?Cardiovascular: Regular rate  ?Respiratory: Normal respiratory effort ?Psychiatric: Normal mood and affect ? ?I have reviewed prior pt notes ? ?I have reviewed notes from referring/previous physicians-prior op notes reviewed ? ?I have reviewed urinalysis results-hematuria with  pyuria ? ?I have independently reviewed prior imaging ? ?I have reviewed prior urine culture--urine culture from February grew resistant Serratia, sensitive to sulfa ? ? ?Impression/Assessment:  ?1.  Bilateral hydronephrosis.  Fairly normal renal function at the present time, but this needs to be addressed with the patient having a history of CIS of the bladder ? ?2.  CIS of the bladder, question whether he could have muscular involvement with his hydronephrosis ? ?3.  Probable urinary tract infection ? ?Plan:  ?1.  We will not give BCG currently, I will send urine for culture and have started antibiotic-Augmentin ? ?2.  I will review his CT results.  If significant hydronephrosis continues, he will need more aggressive management of his bladder cancer i.e. repeat biopsy/resection.  If muscle involvement present he will need to go on to cystectomy ? ?

## 2021-12-19 NOTE — Patient Instructions (Signed)

## 2021-12-19 NOTE — H&P (View-Only) (Signed)
H&P ? ?Chief Complaint: Here for follow-up of carcinoma in situ/hydronephrosis ? ?History of Present Illness: Since last ? ?08/17/2021-cystoscopy, bilateral retrograde pyelograms, transurethral resection of bladder tumors.  There was left hydronephrosis.  His left kidney was stented.  Pathology revealed carcinoma in situ. ? ?09/07/2021-cystoscopy, left retrograde pyelogram, left ureteroscopy.  No point of obstruction or stone seen.  No stent was left in. ? ?2.14.2023: He comes in today for follow-up.  He complains about weight loss over the past few months, chronic cough x2 months, nausea, emesis, cold intolerance.  He has not had any follow-up or treatment for his carcinoma in situ.  Preoperative CT scan without contrast revealed no evidence of extravesical disease.  Urine culture at this visit grew Serratia, he was treated with sulfa. ? ?3.21.2023: Recent basic metabolic panel revealed a creatinine of 1.19 which is a bit higher than his baseline.  He did have a CT scan this morning.  He still has decreased appetite.  He has had no fever.  He does have mild amount of blood in urine. ? ?Past Medical History:  ?Diagnosis Date  ? Anxiety   ? Bladder cancer ([place])   ? Cancer [facility])   ? Depression   ? Hemorrhoids   ? Hypertension   ? ? ?Past Surgical History:  ?Procedure Laterality Date  ? ADRENALECTOMY    ? [facility]  ? CHOLECYSTECTOMY  2001  ? COLONOSCOPY  11/09/2011  ? Dr. Oneida Alar: two tubular adenomas, mild diverticulosis, internal hemorrhoids, surveillance in 10 years   ? CYST EXCISION  1970's  ? back  ? CYSTOSCOPY W/ RETROGRADES Bilateral 08/17/2021  ? Procedure: CYSTOSCOPY WITH RETROGRADE PYELOGRAM;  Surgeon: Cleon Gustin, MD;  Location: [facility];  Service: Urology;  Laterality: Bilateral;  ? CYSTOSCOPY W/ RETROGRADES Left 09/07/2021  ? Procedure: CYSTOSCOPY WITH RETROGRADE PYELOGRAM;  Surgeon: Cleon Gustin, MD;  [facility]: [facility];  Service: Urology;  Laterality: Left;  ? ESOPHAGOGASTRODUODENOSCOPY  10/07/2012   ? Dr. Arnoldo Morale: normal, Clotest negative  ? HERNIA REPAIR  1956  ? INGUINAL HERNIA REPAIR Right 11/04/2013  ? Procedure: RECURRENT RIGHT INGUINAL HERNIA REPAIR;  Surgeon: Jamesetta So, MD;  Location: [facility];  Service: General;  Laterality: Right;  ? Left shoulder surgery  2011  ? LESION REMOVAL N/A 11/04/2013  ? Procedure: EXCISION OF SKIN LESION ABDOMINAL WALL;  Surgeon: Jamesetta So, MD;  [facility];  Service: General;  Laterality: N/A;  ? TRANSURETHRAL RESECTION OF BLADDER TUMOR N/A 08/17/2021  ? Procedure: TRANSURETHRAL RESECTION OF BLADDER TUMOR (TURBT);  Surgeon: Cleon Gustin, MD;  Location: [facility];  Service: Urology;  Laterality: N/A;  ? URETEROSCOPY Left 09/07/2021  ? Procedure: URETEROSCOPY-DIAGNOSTIC;  Surgeon: Cleon Gustin, MD;  Location: [facility];  Service: Urology;  Laterality: Left;  ? ? ?Home Medications:  ?Allergies as of 12/19/2021   ? ?   Reactions  ? Red Dye Nausea And Vomiting  ? Gadolinium Nausea And Vomiting  ?  Code: VOM, with injection, no other reactions noted, Onset Date: [id number]  ? ?  ? ?  ?Medication List  ?  ? ?  ? Accurate as of December 19, 2021 11:52 AM. If you have any questions, ask your nurse or doctor.  ?  ?  ? ?  ? ?ALPRAZolam 0.5 MG tablet ?Commonly known as: Duanne Moron ?Take 0.5 mg by mouth at bedtime. For sleep. ?  ?amoxicillin-clavulanate 875-125 MG tablet ?Commonly known as: AUGMENTIN ?Take 1 tablet by mouth every 12 (twelve) hours. ?Started  by: Jorja Loa, MD ?  ?diphenhydrAMINE 25 MG tablet ?Commonly known as: BENADRYL ?Take 25 mg by mouth daily as needed for allergies. ?  ?doxylamine (Sleep) 25 MG tablet ?Commonly known as: UNISOM ?Take 12.5 mg by mouth at bedtime as needed for sleep. ?  ?ibuprofen 200 MG tablet ?Commonly known as: ADVIL ?Take 200 mg by mouth every 6 (six) hours as needed for headache or moderate pain. ?  ?multivitamin with minerals Tabs tablet ?Take 1 tablet by mouth daily. ?  ?omeprazole 20 MG capsule ?Commonly known as:  PRILOSEC ?Take 20 mg by mouth daily. ?  ?ondansetron 4 MG tablet ?Commonly known as: ZOFRAN ?Take 1 tablet (4 mg total) by mouth every 8 (eight) hours as needed for nausea or vomiting. ?  ?oxyCODONE-acetaminophen 5-325 MG tablet ?Commonly known as: Percocet ?Take 1 tablet by mouth every 4 (four) hours as needed for severe pain. ?What changed: how much to take ?  ?rOPINIRole 0.25 MG tablet ?Commonly known as: REQUIP ?Take 0.5 mg by mouth daily. ?  ?silodosin 8 MG Caps capsule ?Commonly known as: RAPAFLO ?Take 1 capsule (8 mg total) by mouth daily with breakfast. ?  ?SINUS/ALLERGY PE PO ?Take 1 tablet by mouth daily as needed (allergies). ?  ?sulfamethoxazole-trimethoprim 800-160 MG tablet ?Commonly known as: BACTRIM DS ?Take 1 tablet by mouth every 12 (twelve) hours. ?  ?tamsulosin 0.4 MG Caps capsule ?Commonly known as: FLOMAX ?Take 0.4 mg by mouth daily. ?  ? ?  ? ? ?Allergies:  ?Allergies  ?Allergen Reactions  ? Red Dye Nausea And Vomiting  ? Gadolinium Nausea And Vomiting  ?   Code: VOM, with injection, no other reactions noted, Onset Date: [id number] ?  ? ? ?Family History  ?Problem Relation Age of Onset  ? Hypertension Mother   ? Cancer Father   ? Colon cancer Neg Hx   ? Diabetes Neg Hx   ? Thyroid disease Neg Hx   ? ? ?Social History:  reports that he has never smoked. He has never used smokeless tobacco. He reports current alcohol use. He reports that he does not use drugs. ? ?ROS: ?A complete review of systems was performed.  All systems are negative except for pertinent findings as noted. ? ?Physical Exam:  ?Vital signs in last 24 hours: ?There were no vitals taken for this visit. ?Constitutional:  Alert and oriented, No acute distress ?Cardiovascular: Regular rate  ?Respiratory: Normal respiratory effort ?Psychiatric: Normal mood and affect ? ?I have reviewed prior pt notes ? ?I have reviewed notes from referring/previous physicians-prior op notes reviewed ? ?I have reviewed urinalysis results-hematuria with  pyuria ? ?I have independently reviewed prior imaging ? ?I have reviewed prior urine culture--urine culture from February grew resistant Serratia, sensitive to sulfa ? ? ?Impression/Assessment:  ?1.  Bilateral hydronephrosis.  Fairly normal renal function at the present time, but this needs to be addressed with the patient having a history of CIS of the bladder ? ?2.  CIS of the bladder, question whether he could have muscular involvement with his hydronephrosis ? ?3.  Probable urinary tract infection ? ?Plan:  ?1.  We will not give BCG currently, I will send urine for culture and have started antibiotic-Augmentin ? ?2.  I will review his CT results.  If significant hydronephrosis continues, he will need more aggressive management of his bladder cancer i.e. repeat biopsy/resection.  If muscle involvement present he will need to go on to cystectomy ? ?

## 2021-12-19 NOTE — Progress Notes (Signed)
BCG Bladder Instillation ? ?BCG # 1 6of 6    ? ?Due to Bladder Cancer patient is present today for a BCG treatment. Patient was cleaned and prepped in a sterile fashion with betadine. A 14FR catheter was inserted, urine return was noted 361m, urine was pinkish/red in color.  BCG treatment held due to the appearance of the urine obtained from the bladder. The catheter was then removed. There was some difficulty passing the catheter through the prostate. Will increase to 16 FR for the remaining treatments. Urine sent for culture. ? ?Performed by: Juliah Scadden LPN ? ?Follow up/ Additional notes: keep next scheduled bcg tx. ? ?

## 2021-12-22 ENCOUNTER — Other Ambulatory Visit: Payer: Self-pay | Admitting: Physician Assistant

## 2021-12-22 ENCOUNTER — Telehealth: Payer: Self-pay

## 2021-12-22 ENCOUNTER — Other Ambulatory Visit: Payer: Self-pay | Admitting: Urology

## 2021-12-22 DIAGNOSIS — N3000 Acute cystitis without hematuria: Secondary | ICD-10-CM

## 2021-12-22 DIAGNOSIS — R8281 Pyuria: Secondary | ICD-10-CM

## 2021-12-22 LAB — URINE CULTURE

## 2021-12-22 MED ORDER — CIPROFLOXACIN HCL 500 MG PO TABS: 500.0000 mg | ORAL_TABLET | Freq: Two times a day (BID) | ORAL | 0 refills | Status: AC

## 2021-12-22 MED ORDER — SULFAMETHOXAZOLE-TRIMETHOPRIM 800-160 MG PO TABS: 1.0000 | ORAL_TABLET | Freq: Two times a day (BID) | ORAL | 0 refills | Status: DC

## 2021-12-22 MED ORDER — CIPROFLOXACIN HCL 500 MG PO TABS
500.0000 mg | ORAL_TABLET | Freq: Two times a day (BID) | ORAL | 0 refills | Status: DC
Start: 2021-12-22 — End: 2021-12-22

## 2021-12-22 NOTE — Telephone Encounter (Signed)
Patient called and made aware. ?Patient voiced understanding to stop Augmentin and start Cipro ?

## 2021-12-22 NOTE — Telephone Encounter (Signed)
-----   Message from Reynaldo Minium, [place] sent at 12/22/2021 12:48 PM EDT ----- ?Please let the patient know that his urine culture indicates that he is resistant to Augmentin.  He needs to stop Augmentin and start new prescription for Bactrim DS that I am sending to the pharmacy now. ?----- Message ----- ?From: Audie Box, CMA ?Sent: 12/22/2021  12:33 PM EDT ?To: Franchot Gallo, MD, # ? ?Patient started on Augmentin, Please review  ? ?

## 2021-12-22 NOTE — Progress Notes (Signed)
Urine culture resistant to Augmentin. Will DC and start Bactrim ?

## 2021-12-22 NOTE — Telephone Encounter (Signed)
Pt aware, will stop Augmentin and start Bactrim ?

## 2021-12-22 NOTE — Telephone Encounter (Signed)
-----   Message from Franchot Gallo, MD sent at 12/22/2021 12:50 PM EDT ----- ?Please let pt know culture + but res to augmentin--stop that, I sent in abx ?----- Message ----- ?From: Audie Box, CMA ?Sent: 12/22/2021  12:33 PM EDT ?To: Franchot Gallo, MD, # ? ?Patient started on Augmentin, Please review  ? ?

## 2021-12-22 NOTE — Progress Notes (Signed)
Urine culture results addressed earlier in the day by Dr. Diona Fanti who prescribed Cipro for the patient.  Inadvertently, patient was also sent a prescription for Bactrim which has been canceled via contact with the pharmacy.  The pharmacist has filled Cipro as prescribed by Dr. Diona Fanti and the patient is aware that he needs to pick this up and discontinue the Augmentin. ?

## 2021-12-26 ENCOUNTER — Ambulatory Visit (INDEPENDENT_AMBULATORY_CARE_PROVIDER_SITE_OTHER): Payer: Medicare Other

## 2021-12-26 ENCOUNTER — Other Ambulatory Visit: Payer: Self-pay

## 2021-12-26 DIAGNOSIS — C678 Malignant neoplasm of overlapping sites of bladder: Secondary | ICD-10-CM | POA: Diagnosis not present

## 2021-12-26 DIAGNOSIS — Z20822 Contact with and (suspected) exposure to covid-19: Secondary | ICD-10-CM | POA: Diagnosis not present

## 2021-12-26 MED ORDER — BCG LIVE 50 MG IS SUSR
3.2400 mL | Freq: Once | INTRAVESICAL | Status: AC
  Administered 2021-12-26: 81 mg via INTRAVESICAL

## 2021-12-26 NOTE — Progress Notes (Signed)
BCG Bladder Instillation ? ?BCG # 1 of 6 ? ?Due to Bladder Cancer patient is present today for a BCG treatment. Patient was cleaned and prepped in a sterile fashion with betadine. A 16FR catheter was inserted, urine return was noted 434m, urine was yellow in color.  529mof reconstituted BCG was instilled into the bladder. The catheter was then removed. Patient tolerated well, no complications were noted ? ?Performed by: Jemuel Laursen LPN ? ?Follow up/ Additional notes: Keep next scheduled NV ? ?

## 2021-12-26 NOTE — Patient Instructions (Signed)

## 2021-12-27 ENCOUNTER — Telehealth: Payer: Self-pay

## 2021-12-27 LAB — URINALYSIS, ROUTINE W REFLEX MICROSCOPIC
Bilirubin, UA: NEGATIVE
Ketones, UA: NEGATIVE
Nitrite, UA: NEGATIVE
Protein,UA: NEGATIVE
Specific Gravity, UA: 1.02 (ref 1.005–1.030)
Urobilinogen, Ur: 0.2 mg/dL (ref 0.2–1.0)
pH, UA: 6.5 (ref 5.0–7.5)

## 2021-12-27 LAB — MICROSCOPIC EXAMINATION

## 2021-12-27 NOTE — Telephone Encounter (Signed)
Posting reviewed with Dr. Alyson Ingles ? ?Will schedule for surgery.  ?

## 2021-12-27 NOTE — Telephone Encounter (Signed)
-----   Message from Franchot Gallo, MD sent at 12/26/2021  3:42 PM EDT ----- ?I called patient with results.  He now has hydronephrosis bilaterally.  I talked with Dr. Alyson Ingles this morning.  I think the patient needs to have repeat cystoscopy, retrogrades and bladder biopsies.  I have filled out a scheduling sheet.  Have Dr. Alyson Ingles review it. ?----- Message ----- ?From: Dorisann Frames, RN ?Sent: 12/20/2021  12:35 PM EDT ?To: Franchot Gallo, MD ? ?Please review ? ?

## 2022-01-02 NOTE — Telephone Encounter (Signed)
I spoke with Mr. Piechota. We have discussed possible surgery dates and 01/18/2022 was agreed upon by all parties. Patient given information about surgery date, what to expect pre-operatively and post operatively.  ?  ?We discussed that a pre-op nurse will be calling to set up the pre-op visit that will take place prior to surgery. Informed patient that our office will communicate any additional care to be provided after surgery.  ?  ?Patients questions or concerns were discussed during our call. Advised to call our office should there be any additional information, questions or concerns that arise. Patient verbalized understanding.   ? ? ? ?I also left message for patient that BCG treatments will be cancelled until after surgery and resume depending on pathology report per Dr. Alyson Ingles.  ?

## 2022-01-03 DIAGNOSIS — G2581 Restless legs syndrome: Secondary | ICD-10-CM | POA: Diagnosis not present

## 2022-01-03 DIAGNOSIS — G4709 Other insomnia: Secondary | ICD-10-CM | POA: Diagnosis not present

## 2022-01-03 DIAGNOSIS — Z6820 Body mass index (BMI) 20.0-20.9, adult: Secondary | ICD-10-CM | POA: Diagnosis not present

## 2022-01-03 DIAGNOSIS — C679 Malignant neoplasm of bladder, unspecified: Secondary | ICD-10-CM | POA: Diagnosis not present

## 2022-01-03 DIAGNOSIS — R5383 Other fatigue: Secondary | ICD-10-CM | POA: Diagnosis not present

## 2022-01-04 ENCOUNTER — Ambulatory Visit: Payer: Medicare Other | Admitting: Physician Assistant

## 2022-01-09 ENCOUNTER — Ambulatory Visit: Payer: Medicare Other | Admitting: Physician Assistant

## 2022-01-12 NOTE — Patient Instructions (Signed)
? ? ? ? ? ? ? ? Chad Butler ? 01/12/2022  ?  ? '@PREFPERIOPPHARMACY'$ @ ? ? Your procedure is scheduled on  01/18/2022. ? ? Report to [facility] at  0930  A.M. ? ? Call this number if you have problems the morning of surgery: ? [phone] ? ? Remember: ? Do not eat or drink after midnight. ?  ?  ? Take these medicines the morning of surgery with A SIP OF WATER  ?       ?                       prilosec, oxycodone(if needed). ?  ? ? Do not wear jewelry, make-up or nail polish. ? Do not wear lotions, powders, or perfumes, or deodorant. ? Do not shave 48 hours prior to surgery.  Men may shave face and neck. ? Do not bring valuables to the hospital. ? [facility] is not responsible for any belongings or valuables. ? ?Contacts, dentures or bridgework may not be worn into surgery.  Leave your suitcase in the car.  After surgery it may be brought to your room. ? ?For patients admitted to the hospital, discharge time will be determined by your treatment team. ? ?Patients discharged the day of surgery will not be allowed to drive home and must have someone with them for 24 hours.  ? ? ?Special instructions:   DO NOT smoke tobacco or vape for 24 hours before your procedure. ? ?Please read over the following fact sheets that you were given. ?Coughing and Deep Breathing, Surgical Site Infection Prevention, Anesthesia Post-op Instructions, and Care and Recovery After Surgery ?  ? ? ? Transurethral Resection of Bladder Tumor, Care After ?The following information offers guidance on how to care for yourself after your procedure. Your health care provider may also give you more specific instructions. If you have problems or questions, contact your health care provider. ?What can I expect after the procedure? ?After the procedure, it is common to have: ?A small amount of blood or small blood clots in your urine for up to 2 weeks. ?Soreness or mild pain from your catheter. After your catheter is removed, you may have mild soreness,  especially when urinating. ?A need to urinate often. ?Pain in your lower abdomen. ?Follow these instructions at home: ?Medicines ? ?Take over-the-counter and prescription medicines only as told by your health care provider. ?If you were prescribed an antibiotic medicine, take it as told by your health care provider. Do not stop taking the antibiotic even if you start to feel better. ?Ask your health care provider if the medicine prescribed to you: ?Requires you to avoid driving or using machinery. ?Can cause constipation. You may need to take these actions to prevent or treat constipation: ?Drink enough fluid to keep your urine pale yellow. ?Take over-the-counter or prescription medicines. ?Eat foods that are high in fiber, such as beans, whole grains, and fresh fruits and vegetables. ?Limit foods that are high in fat and processed sugars, such as fried or sweet foods. ?Activity ? ?If you were given a sedative during the procedure, it can affect you for several hours. Do not drive or operate machinery until your health care provider says that it is safe. ?Rest as told by your health care provider. ?Avoid sitting for a long time without moving. Get up to take short walks every 1-2 hours. This is important to improve blood flow and breathing. Ask for help if  you feel weak or unsteady. ?Do not lift anything that is heavier than 10 lb (4.5 kg), or the limit that you are told, until your health care provider says that it is safe. ?Avoid intense physical activity for as long as told by your health care provider. ?Do not have sex until your health care provider approves. ?Return to your normal activities as told by your health care provider. Ask your health care provider what activities are safe for you. ?General instructions ?If you have a catheter, follow instructions from your health care provider about caring for your catheter and your drainage bag. ?Do not drink alcohol for as long as told by your health care  provider. This is especially important if you are taking prescription pain medicines. ?Do not use any products that contain nicotine or tobacco. These products include cigarettes, chewing tobacco, and vaping devices, such as e-cigarettes. If you need help quitting, ask your health care provider. ?Wear compression stockings as told by your health care provider. These stockings help to prevent blood clots and reduce swelling in your legs. ?Keep all follow-up visits. This is important. ?You will need to be followed closely with regular checks of your bladder and urethra (cystoscopies) to make sure that the cancer does not come back. ?Contact a health care provider if: ?You have blood in your urine for more than 2 weeks. ?You become constipated. Signs of constipation may include: ?Having fewer than three bowel movements in a week. ?Difficulty having a bowel movement. ?Stools that are dry, hard, or larger than normal. ?You have a urinary catheter in place, and you have: ?Spasms or pain. ?Problems with your catheter or your catheter is blocked. ?Your catheter has been taken out but you are unable to urinate. ?You have signs of infection, such as: ?Fever or chills. ?Cloudy or bad-smelling urine. ?Get help right away if: ?You have severe abdominal pain that gets worse or does not improve with medicine. ?You have a lot of large blood clots in your urine. ?You develop swelling or pain in your leg. ?You have difficulty breathing. ?These symptoms may be an emergency. Get help right away. Call 911. ?Do not wait to see if the symptoms will go away. ?Do not drive yourself to the hospital. ?Summary ?After your procedure, it is common to have a small amount of blood or small blood clots in your urine, soreness or mild pain from your catheter, and pain in your lower abdomen. ?Take over-the-counter and prescription medicines only as told by your health care provider. ?Rest as told by your health care provider. Follow your health care  provider's instructions about returning to normal activities. Ask what activities are safe for you. ?If you have a catheter, follow instructions from your health care provider about caring for your catheter and your drainage bag. ?This information is not intended to replace advice given to you by your health care provider. Make sure you discuss any questions you have with your health care provider. ?Document Revised: 09/22/2021 Document Reviewed: 09/22/2021 ?Elsevier Patient Education ? [place]. ?General Anesthesia, Adult, Care After ?This sheet gives you information about how to care for yourself after your procedure. Your health care provider may also give you more specific instructions. If you have problems or questions, contact your health care provider. ?What can I expect after the procedure? ?After the procedure, the following side effects are common: ?Pain or discomfort at the IV site. ?Nausea. ?Vomiting. ?Sore throat. ?Trouble concentrating. ?Feeling cold or chills. ?Feeling weak or  tired. ?Sleepiness and fatigue. ?Soreness and body aches. These side effects can affect parts of the body that were not involved in surgery. ?Follow these instructions at home: ?For the time period you were told by your health care provider: ? ?Rest. ?Do not participate in activities where you could fall or become injured. ?Do not drive or use machinery. ?Do not drink alcohol. ?Do not take sleeping pills or medicines that cause drowsiness. ?Do not make important decisions or sign legal documents. ?Do not take care of children on your own. ?Eating and drinking ?Follow any instructions from your health care provider about eating or drinking restrictions. ?When you feel hungry, start by eating small amounts of foods that are soft and easy to digest (bland), such as toast. Gradually return to your regular diet. ?Drink enough fluid to keep your urine pale yellow. ?If you vomit, rehydrate by drinking water, juice, or clear  broth. ?General instructions ?If you have sleep apnea, surgery and certain medicines can increase your risk for breathing problems. Follow instructions from your health care provider about wearing your sleep dev

## 2022-01-15 ENCOUNTER — Ambulatory Visit: Payer: Medicare Other | Admitting: Physician Assistant

## 2022-01-15 ENCOUNTER — Encounter (HOSPITAL_COMMUNITY): Payer: Self-pay

## 2022-01-15 ENCOUNTER — Other Ambulatory Visit: Payer: Self-pay

## 2022-01-16 ENCOUNTER — Encounter (HOSPITAL_COMMUNITY)
Admission: RE | Admit: 2022-01-16 | Discharge: 2022-01-16 | Disposition: A | Discharge status: Home / Self Care | Payer: Medicare Other | Source: Ambulatory Visit | Attending: Urology | Admitting: Urology

## 2022-01-18 ENCOUNTER — Ambulatory Visit (HOSPITAL_BASED_OUTPATIENT_CLINIC_OR_DEPARTMENT_OTHER): Payer: Medicare Other | Admitting: Certified Registered Nurse Anesthetist

## 2022-01-18 ENCOUNTER — Ambulatory Visit (HOSPITAL_COMMUNITY)
Admission: RE | Admit: 2022-01-18 | Discharge: 2022-01-18 | Disposition: A | Discharge status: Home / Self Care | Payer: Medicare Other | Attending: Urology | Admitting: Urology

## 2022-01-18 ENCOUNTER — Ambulatory Visit: Payer: Medicare Other | Admitting: Physician Assistant

## 2022-01-18 ENCOUNTER — Encounter (HOSPITAL_COMMUNITY): Payer: Self-pay | Admitting: Urology

## 2022-01-18 ENCOUNTER — Ambulatory Visit (HOSPITAL_COMMUNITY): Payer: Medicare Other

## 2022-01-18 ENCOUNTER — Encounter (HOSPITAL_COMMUNITY)
Admission: RE | Disposition: A | Discharge status: Home / Self Care | Payer: Self-pay | Source: Home / Self Care | Attending: Urology

## 2022-01-18 ENCOUNTER — Ambulatory Visit (HOSPITAL_COMMUNITY): Payer: Medicare Other | Admitting: Certified Registered Nurse Anesthetist

## 2022-01-18 DIAGNOSIS — D494 Neoplasm of unspecified behavior of bladder: Secondary | ICD-10-CM | POA: Insufficient documentation

## 2022-01-18 DIAGNOSIS — F32A Depression, unspecified: Secondary | ICD-10-CM | POA: Diagnosis not present

## 2022-01-18 DIAGNOSIS — F419 Anxiety disorder, unspecified: Secondary | ICD-10-CM | POA: Diagnosis not present

## 2022-01-18 DIAGNOSIS — I10 Essential (primary) hypertension: Secondary | ICD-10-CM | POA: Insufficient documentation

## 2022-01-18 DIAGNOSIS — N1330 Unspecified hydronephrosis: Secondary | ICD-10-CM | POA: Diagnosis not present

## 2022-01-18 DIAGNOSIS — Z8551 Personal history of malignant neoplasm of bladder: Secondary | ICD-10-CM | POA: Insufficient documentation

## 2022-01-18 DIAGNOSIS — K219 Gastro-esophageal reflux disease without esophagitis: Secondary | ICD-10-CM | POA: Insufficient documentation

## 2022-01-18 DIAGNOSIS — N3090 Cystitis, unspecified without hematuria: Secondary | ICD-10-CM

## 2022-01-18 HISTORY — PX: CYSTOSCOPY W/ URETERAL STENT PLACEMENT: SHX1429

## 2022-01-18 HISTORY — PX: TRANSURETHRAL RESECTION OF BLADDER TUMOR: SHX2575

## 2022-01-18 SURGERY — TURBT (TRANSURETHRAL RESECTION OF BLADDER TUMOR)
Anesthesia: General | Site: Ureter

## 2022-01-18 MED ORDER — CEFAZOLIN SODIUM-DEXTROSE 2-4 GM/100ML-% IV SOLN
INTRAVENOUS | Status: AC
Start: 2022-01-18 — End: 2022-01-18
  Filled 2022-01-18: qty 100

## 2022-01-18 MED ORDER — ROCURONIUM BROMIDE 10 MG/ML (PF) SYRINGE
PREFILLED_SYRINGE | INTRAVENOUS | Status: DC | PRN
  Administered 2022-01-18: 60 mg via INTRAVENOUS

## 2022-01-18 MED ORDER — FENTANYL CITRATE (PF) 250 MCG/5ML IJ SOLN
INTRAMUSCULAR | Status: AC
  Filled 2022-01-18: qty 5

## 2022-01-18 MED ORDER — FENTANYL CITRATE PF 50 MCG/ML IJ SOSY: 25.0000 ug | PREFILLED_SYRINGE | INTRAMUSCULAR | Status: DC | PRN

## 2022-01-18 MED ORDER — FENTANYL CITRATE (PF) 250 MCG/5ML IJ SOLN
INTRAMUSCULAR | Status: DC | PRN
  Administered 2022-01-18: 50 ug via INTRAVENOUS
  Administered 2022-01-18: 100 ug via INTRAVENOUS
  Administered 2022-01-18: 25 ug via INTRAVENOUS
  Administered 2022-01-18: 50 ug via INTRAVENOUS
  Administered 2022-01-18: 25 ug via INTRAVENOUS

## 2022-01-18 MED ORDER — CEFAZOLIN SODIUM-DEXTROSE 2-4 GM/100ML-% IV SOLN
2.0000 g | INTRAVENOUS | Status: AC
  Administered 2022-01-18: 2 g via INTRAVENOUS

## 2022-01-18 MED ORDER — DIATRIZOATE MEGLUMINE 30 % UR SOLN
URETHRAL | Status: AC
  Filled 2022-01-18: qty 100

## 2022-01-18 MED ORDER — OXYCODONE-ACETAMINOPHEN 5-325 MG PO TABS
1.0000 | ORAL_TABLET | ORAL | 0 refills | Status: DC | PRN
Start: 2022-01-18 — End: 2022-06-07

## 2022-01-18 MED ORDER — STERILE WATER FOR IRRIGATION IR SOLN
Status: DC | PRN
  Administered 2022-01-18: 500 mL

## 2022-01-18 MED ORDER — SUGAMMADEX SODIUM 500 MG/5ML IV SOLN
INTRAVENOUS | Status: AC
  Filled 2022-01-18: qty 5

## 2022-01-18 MED ORDER — PROPOFOL 10 MG/ML IV BOLUS
INTRAVENOUS | Status: AC
  Filled 2022-01-18: qty 20

## 2022-01-18 MED ORDER — DIATRIZOATE MEGLUMINE 30 % UR SOLN
URETHRAL | Status: AC
Start: 2022-01-18 — End: ?
  Filled 2022-01-18: qty 100

## 2022-01-18 MED ORDER — DEXAMETHASONE SODIUM PHOSPHATE 10 MG/ML IJ SOLN
INTRAMUSCULAR | Status: AC
Start: 2022-01-18 — End: ?
  Filled 2022-01-18: qty 1

## 2022-01-18 MED ORDER — SUGAMMADEX SODIUM 200 MG/2ML IV SOLN
INTRAVENOUS | Status: DC | PRN
  Administered 2022-01-18: 144.6 mg via INTRAVENOUS

## 2022-01-18 MED ORDER — ROCURONIUM BROMIDE 10 MG/ML (PF) SYRINGE
PREFILLED_SYRINGE | INTRAVENOUS | Status: AC
Start: 2022-01-18 — End: ?
  Filled 2022-01-18: qty 10

## 2022-01-18 MED ORDER — PHENYLEPHRINE HCL (PRESSORS) 10 MG/ML IV SOLN
INTRAVENOUS | Status: DC | PRN
  Administered 2022-01-18 (×2): 50 ug via INTRAVENOUS

## 2022-01-18 MED ORDER — PROPOFOL 10 MG/ML IV BOLUS
INTRAVENOUS | Status: DC | PRN
  Administered 2022-01-18: 150 mg via INTRAVENOUS

## 2022-01-18 MED ORDER — LACTATED RINGERS IV SOLN
INTRAVENOUS | Status: DC
  Administered 2022-01-18: 1000 mL via INTRAVENOUS

## 2022-01-18 MED ORDER — SODIUM CHLORIDE 0.9 % IR SOLN
Status: DC | PRN
  Administered 2022-01-18: 3000 mL

## 2022-01-18 MED ORDER — KETOROLAC TROMETHAMINE 15 MG/ML IJ SOLN
INTRAMUSCULAR | Status: DC | PRN
  Administered 2022-01-18: 15 mg via INTRAVENOUS

## 2022-01-18 MED ORDER — DEXAMETHASONE SODIUM PHOSPHATE 10 MG/ML IJ SOLN
INTRAMUSCULAR | Status: DC | PRN
Start: 2022-01-18 — End: 2022-01-18
  Administered 2022-01-18: 10 mg via INTRAVENOUS

## 2022-01-18 MED ORDER — ONDANSETRON HCL 4 MG/2ML IJ SOLN: 4.0000 mg | Freq: Once | INTRAMUSCULAR | Status: DC | PRN

## 2022-01-18 MED ORDER — DIATRIZOATE MEGLUMINE 30 % UR SOLN
URETHRAL | Status: DC | PRN
  Administered 2022-01-18: 26 mL via URETHRAL

## 2022-01-18 MED ORDER — LIDOCAINE HCL (PF) 2 % IJ SOLN
INTRAMUSCULAR | Status: AC
  Filled 2022-01-18: qty 15

## 2022-01-18 MED ORDER — LIDOCAINE 2% (20 MG/ML) 5 ML SYRINGE
INTRAMUSCULAR | Status: DC | PRN
  Administered 2022-01-18: 60 mg via INTRAVENOUS

## 2022-01-18 SURGICAL SUPPLY — 32 items
BAG DRAIN URO TABLE W/ADPT NS (BAG) ×4 IMPLANT
BAG DRN 8 ADPR NS SKTRN CSTL (BAG) ×2
BAG DRN RND TRDRP ANRFLXCHMBR (UROLOGICAL SUPPLIES) ×2
BAG HAMPER (MISCELLANEOUS) ×4 IMPLANT
BAG URINE DRAIN 2000ML AR STRL (UROLOGICAL SUPPLIES) ×4 IMPLANT
CATH FOLEY 2WAY SLVR  5CC 20FR (CATHETERS) ×3
CATH FOLEY 2WAY SLVR 5CC 20FR (CATHETERS) IMPLANT
CLOTH BEACON ORANGE TIMEOUT ST (SAFETY) ×4 IMPLANT
ELECT LOOP 22F BIPOLAR SML (ELECTROSURGICAL) ×3
ELECTRODE LOOP 22F BIPOLAR SML (ELECTROSURGICAL) ×3 IMPLANT
GLOVE BIO SURGEON STRL SZ8 (GLOVE) ×4 IMPLANT
GLOVE BIOGEL PI IND STRL 6.5 (GLOVE) IMPLANT
GLOVE BIOGEL PI IND STRL 7.0 (GLOVE) ×6 IMPLANT
GLOVE BIOGEL PI INDICATOR 6.5 (GLOVE) ×1
GLOVE BIOGEL PI INDICATOR 7.0 (GLOVE) ×2
GLOVE SURG SS PI 6.5 STRL IVOR (GLOVE) ×2 IMPLANT
GOWN STRL REUS W/TWL LRG LVL3 (GOWN DISPOSABLE) ×4 IMPLANT
GOWN STRL REUS W/TWL XL LVL3 (GOWN DISPOSABLE) ×4 IMPLANT
GUIDEWIRE STR ZIPWIRE 035X150 (MISCELLANEOUS) ×4 IMPLANT
IV NS IRRIG 3000ML ARTHROMATIC (IV SOLUTION) ×8 IMPLANT
KIT TURNOVER CYSTO (KITS) ×4 IMPLANT
MANIFOLD NEPTUNE II (INSTRUMENTS) ×4 IMPLANT
PACK CYSTO (CUSTOM PROCEDURE TRAY) ×4 IMPLANT
PAD ARMBOARD 7.5X6 YLW CONV (MISCELLANEOUS) ×4 IMPLANT
PLUG CATH AND CAP STER (CATHETERS) IMPLANT
STENT URET 6FRX26 CONTOUR (STENTS) ×2 IMPLANT
SYR 10ML LL (SYRINGE) ×4 IMPLANT
SYR 30ML LL (SYRINGE) IMPLANT
SYR TOOMEY IRRIG 70ML (MISCELLANEOUS) ×3
SYRINGE TOOMEY IRRIG 70ML (MISCELLANEOUS) IMPLANT
TOWEL OR 17X26 4PK STRL BLUE (TOWEL DISPOSABLE) ×4 IMPLANT
WATER STERILE IRR 500ML POUR (IV SOLUTION) ×4 IMPLANT

## 2022-01-18 NOTE — Anesthesia Procedure Notes (Signed)
Procedure Name: Intubation ?Date/Time: 01/18/2022 11:55 AM ?Performed by: Maude Leriche, CRNA ?Pre-anesthesia Checklist: Patient identified, Emergency Drugs available, Suction available and Patient being monitored ?Patient Re-evaluated:Patient Re-evaluated prior to induction ?Oxygen Delivery Method: Circle system utilized ?Preoxygenation: Pre-oxygenation with 100% oxygen ?Induction Type: IV induction ?Ventilation: Mask ventilation without difficulty ?Laryngoscope Size: Sabra Heck and 2 ?Grade View: Grade I ?Tube type: Oral ?Tube size: 7.5 mm ?Number of attempts: 1 ?Airway Equipment and Method: Stylet and Bite block ?Placement Confirmation: ETT inserted through vocal cords under direct vision, positive ETCO2 and breath sounds checked- equal and bilateral ?Secured at: 23 cm ?Tube secured with: Tape ?Dental Injury: Teeth and Oropharynx as per pre-operative assessment  ? ? ? ? ?

## 2022-01-18 NOTE — Transfer of Care (Signed)
Immediate Anesthesia Transfer of Care Note ? ?Patient: Chad Butler ? ?Procedure(s) Performed: TRANSURETHRAL RESECTION OF BLADDER TUMOR (TURBT) (Bladder) ?CYSTOSCOPY WITH RETROGRADE PYELOGRAM/ BILATERAL URETERAL STENT PLACEMENT (Bilateral: Ureter) ? ?Patient Location: PACU ? ?Anesthesia Type:General ? ?Level of Consciousness: awake, alert  and oriented ? ?Airway & Oxygen Therapy: Patient Spontanous Breathing and Patient connected to nasal cannula oxygen ? ?Post-op Assessment: Report given to RN, Post -op Vital signs reviewed and stable, Patient moving all extremities X 4 and Patient able to stick tongue midline ? ?Post vital signs: Reviewed ? ?Last Vitals:  ?Vitals Value Taken Time  ?BP 156/76   ?Temp 97.9   ?Pulse 77 01/18/22 1239  ?Resp 13 01/18/22 1240  ?SpO2 100 % 01/18/22 1239  ?Vitals shown include unvalidated device data. ? ?Last Pain:  ?Vitals:  ? 01/18/22 1123  ?TempSrc: Oral  ?PainSc: 0-No pain  ?   ? ?Patients Stated Pain Goal: 5 (01/18/22 1123) ? ?Complications: No notable events documented. ?

## 2022-01-18 NOTE — Interval H&P Note (Signed)
History and Physical Interval Note: ? ?01/18/2022 ?11:13 AM ? ?Chad Butler  has presented today for surgery, with the diagnosis of bladder cancer ?bilateral hydronephrosis.  The various methods of treatment have been discussed with the patient and family. After consideration of risks, benefits and other options for treatment, the patient has consented to  Procedure(s): ?TRANSURETHRAL RESECTION OF BLADDER TUMOR (TURBT) ( possible) (N/A) ?CYSTOSCOPY WITH RETROGRADE PYELOGRAM/ Possible URETERAL STENT PLACEMENT (Bilateral) as a surgical intervention.  The patient's history has been reviewed, patient examined, no change in status, stable for surgery.  I have reviewed the patient's chart and labs.  Questions were answered to the patient's satisfaction.   ? ? ?[name] ? ? ?

## 2022-01-18 NOTE — Op Note (Signed)
Preoperative diagnosis: Bladder Tumor ? ?Postop diagnosis: Same ? ?Procedure: 1.  Cystoscopy ?2. Bilateral retrograde pyelography ?3. Intra-operative fluoroscopy, under 1 hour, with interpretation ?4.Transurethral resection of bladder tumor, medium ?5. Bilateral 6x26 JJ ureteral Stent Placement   ? ? ?Attending: [name] ? ?Anesthesia: General ? ?Estimated blood loss: 5 cc ? ?Drains: 1. 20 French Foley catheter ?2. Bilateral 6x 26 JJ ureteral Stent ? ?Specimens: Bladder tumor ? ?Antibiotics: Ancef ? ?Findings: Moderate to severe bilateral hydroureteronephrosis to the level of the UVJ. Golf hole ureteral orifices. Numerous areas of erythema on the left lateral wall and dome.  ? ?Indications: Patient is a 71 year old with a history of  bladder tumor found on office cystoscopy and bilateral hydronephrosis.   After discussing treatment options patient decided to proceed with transurethral resection of bladder tumor and possible bilateral stent placement. ? ?Procedure in detail: Prior to procedure consetn was obtained. Patient was brought to the operating room and briefing was done sure correct patient, correct procedure, correct site.  General anesthesia was in administered patient was placed in the dorsal lithotomy position.  The rigid 82 French cystoscope was passed urethra and bladder.  Bladder was inspected masses or lesions and we noted numerous large areas of erythema on the left lateral wall and the dome of the bladder, total area 4cm..  We then cannulated the right ureteral orifice with a 6 French ureteral catheter.  A gentle retrograde was obtained in findings noted above. We then placed a zip wire through the ureteral catheter and advanced up to the renal pelvis. We then placed a 6 x 26 double-J ureteral stent over the wire. We then removed the wire and good coil was noted in the pelvis under fluoroscopy in the bladder under direct vision.  We then turned our attention to the left ureteral orifice.   The left ureteral orifice was cannulated with a 6 French ureteral catheter.  A gentle retrograde was obtained in findings noted above.  We then placed a zip wire through the ureteral catheter and advanced up to the renal pelvis. We then placed a 6 x 26 double-J ureteral stent over the wire.  We then removed the wire and good coil was noted in the pelvis under fluoroscopy in the bladder under direct vision.  We then removed the cystoscope and placed a 27 French resectoscope in the bladder.  Using bipolar electrocautery were then removed the bladder tumors on the left lateral wall and the dome of the bladder.  We removed the bladder tumors down to the base exposing muscle.  We then removed the pieces and sent them for pathology.  To obtain hemostasis we then cauterized the bed of the tumors.  Once good hemostasis was noted the bladder was then drained and a 20 [place] Foley catheter was placed. This concluded the procedure which resulted by the patient. ? ?Complications: None ? ?Condition: Stable,  extubated, transferred to PACU. ? ?Plan: The patient is to be discharged home and followup in 5-7 days for a voiding tiral ? ?

## 2022-01-18 NOTE — Anesthesia Postprocedure Evaluation (Signed)
Anesthesia Post Note ? ?Patient: Chad Butler ? ?Procedure(s) Performed: TRANSURETHRAL RESECTION OF BLADDER TUMOR (TURBT) (Bladder) ?CYSTOSCOPY WITH RETROGRADE PYELOGRAM/ BILATERAL URETERAL STENT PLACEMENT (Bilateral: Ureter) ? ?Patient location during evaluation: Phase II ?Anesthesia Type: General ?Level of consciousness: awake ?Pain management: pain level controlled ?Vital Signs Assessment: post-procedure vital signs reviewed and stable ?Respiratory status: spontaneous breathing and respiratory function stable ?Cardiovascular status: blood pressure returned to baseline and stable ?Postop Assessment: no headache and no apparent nausea or vomiting ?Anesthetic complications: no ?Comments: Late entry ? ? ?No notable events documented. ? ? ?Last Vitals:  ?Vitals:  ? 01/18/22 1130 01/18/22 1245  ?BP: (!) 164/75 (!) 153/84  ?Pulse: 66 72  ?Resp: 13 (!) 7  ?Temp:    ?SpO2:    ?  ?Last Pain:  ?Vitals:  ? 01/18/22 1123  ?TempSrc: Oral  ?PainSc: 0-No pain  ? ? ?  ?  ?  ?  ?  ?  ? ?Louann Sjogren ? ? ? ? ?

## 2022-01-18 NOTE — Anesthesia Preprocedure Evaluation (Signed)
Anesthesia Evaluation  ?Patient identified by MRN, date of birth, ID band ?Patient awake ? ? ? ?Reviewed: ?Allergy & Precautions, H&P , NPO status , Patient's Chart, lab work & pertinent test results, reviewed documented beta blocker date and time  ? ?Airway ?Mallampati: II ? ?TM Distance: >3 FB ?Neck ROM: full ? ? ? Dental ?no notable dental hx. ? ?  ?Pulmonary ?neg pulmonary ROS,  ?  ?Pulmonary exam normal ?breath sounds clear to auscultation ? ? ? ? ? ? Cardiovascular ?Exercise Tolerance: Good ?hypertension, negative cardio ROS ? ? ?Rhythm:regular Rate:Normal ? ? ?  ?Neuro/Psych ?PSYCHIATRIC DISORDERS Anxiety Depression negative neurological ROS ?   ? GI/Hepatic ?Neg liver ROS, GERD  Medicated,  ?Endo/Other  ?negative endocrine ROS ? Renal/GU ?negative Renal ROS  ?negative genitourinary ?  ?Musculoskeletal ? ? Abdominal ?  ?Peds ? Hematology ?negative hematology ROS ?(+)   ?Anesthesia Other Findings ? ? Reproductive/Obstetrics ?negative OB ROS ? ?  ? ? ? ? ? ? ? ? ? ? ? ? ? ?  ?  ? ? ? ? ? ? ? ? ?Anesthesia Physical ?Anesthesia Plan ? ?ASA: 2 ? ?Anesthesia Plan: General and General LMA  ? ?Post-op Pain Management:   ? ?Induction:  ? ?PONV Risk Score and Plan: Ondansetron ? ?Airway Management Planned:  ? ?Additional Equipment:  ? ?Intra-op Plan:  ? ?Post-operative Plan:  ? ?Informed Consent: I have reviewed the patients History and Physical, chart, labs and discussed the procedure including the risks, benefits and alternatives for the proposed anesthesia with the patient or authorized representative who has indicated his/her understanding and acceptance.  ? ? ? ?Dental Advisory Given ? ?Plan Discussed with: CRNA ? ?Anesthesia Plan Comments:   ? ? ? ? ? ? ?Anesthesia Quick Evaluation ? ?

## 2022-01-19 ENCOUNTER — Encounter (HOSPITAL_COMMUNITY): Payer: Self-pay | Admitting: Urology

## 2022-01-22 LAB — SURGICAL PATHOLOGY

## 2022-01-23 ENCOUNTER — Ambulatory Visit: Payer: Medicare Other | Admitting: Physician Assistant

## 2022-01-25 ENCOUNTER — Ambulatory Visit (INDEPENDENT_AMBULATORY_CARE_PROVIDER_SITE_OTHER): Payer: Medicare Other | Admitting: Physician Assistant

## 2022-01-25 DIAGNOSIS — Z20822 Contact with and (suspected) exposure to covid-19: Secondary | ICD-10-CM | POA: Diagnosis not present

## 2022-01-25 DIAGNOSIS — R339 Retention of urine, unspecified: Secondary | ICD-10-CM

## 2022-01-25 DIAGNOSIS — C678 Malignant neoplasm of overlapping sites of bladder: Secondary | ICD-10-CM | POA: Diagnosis not present

## 2022-01-25 NOTE — Patient Instructions (Addendum)
Continue Rapaflo (silodosin) daily until follow up. Stop taking tamsulosin (Flomax) ?

## 2022-01-25 NOTE — Progress Notes (Signed)
Fill and Pull Catheter Removal ? ?Patient is present today for a catheter removal.  Patient was cleaned and prepped in a sterile fashion 141m of sterile water/ saline was instilled into the bladder when the patient felt the urge to urinate. 137mof water was then drained from the balloon.  A 20 FR foley cath was removed from the bladder no complications were noted .  Patient as then given some time to void on their own.  Patient can void  6062mn their own after some time.  Patient tolerated well. ? ?PVR= 76 ? ?Performed by: Ivyonna Hoelzel LPN ? ?Follow up/ Additional notes: Patient will f/u this afternoon for PVR ? ? ?Patient returned this afternoon ?post void residual= 123 ?

## 2022-01-25 NOTE — Progress Notes (Signed)
? ?Assessment: ?  ? ?Assessment: ?1. Incomplete bladder emptying ?- BLADDER SCAN AMB NON-IMAGING ? ?2. Malignant neoplasm of overlapping sites of bladder [facility]) ?  ? ?Plan: ?Continue Rapaflo and FU this afternoon for PVR, sooner if he develops pain or urgency without ability to void. Pt is scheduled for FU with Dr. Alyson Ingles for post op eval and recheck for h/o BPH and bilateral hydronephrosis ? ?Chief Complaint: ?No chief complaint on file. ? ? ?HPI: ?Chad Butler is a 71 y.o. male who presents for voiding trial post TURBT and B stent placement performed on 01/18/22. Pt is 7 days post op. No c/o today. No hematuria for the past few days. Pt denies fever, chills, abdominal pain. Pt voided well. PVR 76 ? ?Portions of the above documentation were copied from a prior visit for review purposes only. ? ?Allergies: ?Allergies  ?Allergen Reactions  ? Red Dye Nausea And Vomiting  ? Gadolinium Nausea And Vomiting  ?   Code: VOM, with injection, no other reactions noted, Onset Date: [id number] ?  ? ? ?PMH: ?Past Medical History:  ?Diagnosis Date  ? Anxiety   ? Bladder cancer ([place])   ? Cancer [facility])   ? Depression   ? Hemorrhoids   ? Hypertension   ? ? ?PSH: ?Past Surgical History:  ?Procedure Laterality Date  ? ADRENALECTOMY    ? [facility]  ? CHOLECYSTECTOMY  2001  ? COLONOSCOPY  11/09/2011  ? Dr. Oneida Alar: two tubular adenomas, mild diverticulosis, internal hemorrhoids, surveillance in 10 years   ? CYST EXCISION  1970's  ? back  ? CYSTOSCOPY W/ RETROGRADES Bilateral 08/17/2021  ? Procedure: CYSTOSCOPY WITH RETROGRADE PYELOGRAM;  Surgeon: Cleon Gustin, MD;  Location: [facility];  Service: Urology;  Laterality: Bilateral;  ? CYSTOSCOPY W/ RETROGRADES Left 09/07/2021  ? Procedure: CYSTOSCOPY WITH RETROGRADE PYELOGRAM;  Surgeon: Cleon Gustin, MD;  Location: [facility];  Service: Urology;  Laterality: Left;  ? CYSTOSCOPY W/ URETERAL STENT PLACEMENT Bilateral 01/18/2022  ? Procedure: CYSTOSCOPY WITH RETROGRADE PYELOGRAM/ BILATERAL  URETERAL STENT PLACEMENT;  Surgeon: Cleon Gustin, MD;  Location: [facility];  Service: Urology;  Laterality: Bilateral;  ? ESOPHAGOGASTRODUODENOSCOPY  10/07/2012  ? Dr. Arnoldo Morale: normal, Clotest negative  ? HERNIA REPAIR  1956  ? INGUINAL HERNIA REPAIR Right 11/04/2013  ? Procedure: RECURRENT RIGHT INGUINAL HERNIA REPAIR;  Surgeon: Jamesetta So, MD;  Location: [facility];  Service: General;  Laterality: Right;  ? Left shoulder surgery  2011  ? LESION REMOVAL N/A 11/04/2013  ? Procedure: EXCISION OF SKIN LESION ABDOMINAL WALL;  Surgeon: Jamesetta So, MD;  Location: [facility];  Service: General;  Laterality: N/A;  ? TRANSURETHRAL RESECTION OF BLADDER TUMOR N/A 08/17/2021  ? Procedure: TRANSURETHRAL RESECTION OF BLADDER TUMOR (TURBT);  Surgeon: Cleon Gustin, MD;  Location: [facility];  Service: Urology;  Laterality: N/A;  ? TRANSURETHRAL RESECTION OF BLADDER TUMOR N/A 01/18/2022  ? Procedure: TRANSURETHRAL RESECTION OF BLADDER TUMOR (TURBT);  Surgeon: Cleon Gustin, MD;  Location: [facility];  Service: Urology;  Laterality: N/A;  ? URETEROSCOPY Left 09/07/2021  ? Procedure: URETEROSCOPY-DIAGNOSTIC;  Surgeon: Cleon Gustin, MD;  Location: [facility];  Service: Urology;  Laterality: Left;  ? ? ?SH: ?Social History  ? ?Tobacco Use  ? Smoking status: Never  ? Smokeless tobacco: Never  ?Vaping Use  ? Vaping Use: Never used  ?Substance Use Topics  ? Alcohol use: Yes  ?  Comment: 2 oz wine some days  ? Drug use: No  ? ? ?  ROS: ?Constitutional:  Negative for fever, chills, weight loss ?CV: Negative for chest pain ?Respiratory:  Negative for shortness of breath, wheezing, sleep apnea, frequent cough ?GI:  Negative for nausea, vomiting, bloody stool, GERD ? ?PE: ?BP (P) 115/77   Pulse (P) 86  ?GENERAL APPEARANCE:  Well appearing, well developed, well nourished, NAD ?HEENT:  Atraumatic, normocephalic ?NECK:  Supple. Trachea midline ?ABDOMEN:  Soft, non-tender, no masses ?EXTREMITIES:  Moves all extremities well, without  clubbing, cyanosis, or edema ?NEUROLOGIC:  Alert and oriented x 3, normal gait, CN II-XII grossly intact ?MENTAL STATUS:  appropriate ?BACK:  Non-tender to palpation, No CVAT ?SKIN:  Warm, dry, and intact ? ? ?Results: ?Laboratory Data: ?Lab Results  ?Component Value Date  ? WBC 8.0 08/16/2021  ? HGB 16.6 08/16/2021  ? HCT 48.9 08/16/2021  ? MCV 96.3 08/16/2021  ? PLT 241 08/16/2021  ? ? ?Lab Results  ?Component Value Date  ? CREATININE 1.19 12/15/2021  ? ? ? ?Urinalysis ?   ?Component Value Date/Time  ? COLORURINE STRAW (A) 07/07/2021 1047  ? APPEARANCEUR Clear 12/26/2021 1047  ? LABSPEC 1.002 (L) 07/07/2021 1047  ? PHURINE 7.0 07/07/2021 1047  ? GLUCOSEU Trace (A) 12/26/2021 1047  ? HGBUR LARGE (A) 07/07/2021 1047  ? BILIRUBINUR Negative 12/26/2021 1047  ? [place] NEGATIVE 07/07/2021 1047  ? PROTEINUR Negative 12/26/2021 1047  ? PROTEINUR NEGATIVE 07/07/2021 1047  ? UROBILINOGEN 1.0 02/22/2020 1612  ? UROBILINOGEN 2.0 (H) 11/04/2009 1425  ? NITRITE Negative 12/26/2021 1047  ? NITRITE NEGATIVE 07/07/2021 1047  ? LEUKOCYTESUR 1+ (A) 12/26/2021 1047  ? LEUKOCYTESUR TRACE (A) 07/07/2021 1047  ? ? ?Lab Results  ?Component Value Date  ? LABMICR See below: 12/26/2021  ? WBCUA 6-10 (A) 12/26/2021  ? LABEPIT 0-10 12/26/2021  ? MUCUS Present 12/19/2021  ? BACTERIA Few (A) 12/26/2021  ? ? ?Pertinent Imaging: ?Results for orders placed during the hospital encounter of 01/14/04 ? ?DG Abd 1 View ? ?Narrative ?Clinical Data: Bladder calculi. ?ABDOMEN ONE VIEW ?CT scan on 03/11/03 raised the question of possible bladder calculi versus sessile bladder mass with calcification. Metallic clips, right upper quadrant secondary to cholecystectomy. Unremarkable bowel gas pattern. No definite urinary tract calcifications. There is small amount of stool in the rectum precluding optimal evaluation for bladder calculi. ?IMPRESSION ?Unremarkable plain film of the abdomen. See comments above. ? ?Provider: Briscoe Burns, Jonetta Speak ? ?No  results found for this or any previous visit. ? ?No results found for this or any previous visit. ? ?No results found for this or any previous visit. ? ?Results for orders placed in visit on 11/14/21 ? ?US RENAL ? ?Narrative ?CLINICAL DATA:  Hydronephrosis ? ?EXAM: ?RENAL / URINARY TRACT ULTRASOUND COMPLETE ? ?COMPARISON:  CT renal 07/07/2021 ? ?FINDINGS: ?Right Kidney: ? ?Renal measurements: 13.5 x 7.5 x 7.2 cm = volume: 384 mL. ?Echogenicity within normal limits. No mass. Moderate hydronephrosis ?visualized. ? ?Left Kidney: ? ?Renal measurements: 12.6 x 6.9 x 5.9 cm = volume: 268 mL. ?Echogenicity within normal limits. Severe hydronephrosis visualized. ? ?Urinary bladder: ? ?Urinary bladder diverticula. Otherwise appears normal for degree of ?bladder distention. ? ?Other: ? ?None. ? ?IMPRESSION: ?1. Severe left hydronephrosis and moderate right hydronephrosis. No ?nephrolithiasis identified. Unclear etiology with nonvisualization ?of the ureters. ?2. Urinary bladder diverticula. ? ? ?Electronically Signed ?By: Iven Finn M.D. ?On: 11/20/2021 23:56 ? ?No results found for this or any previous visit. ? ?No results found for this or any previous visit. ? ?Results for orders placed  during the hospital encounter of 07/07/21 ? ?CT Renal Stone Study ? ?Narrative ?CLINICAL DATA:  Hematuria and urinary frequency. History of bladder ?cancer. ? ?EXAM: ?CT ABDOMEN AND PELVIS WITHOUT CONTRAST ? ?TECHNIQUE: ?Multidetector CT imaging of the abdomen and pelvis was performed ?following the standard protocol without IV contrast. ? ?COMPARISON:  CT abdomen pelvis 09/13/2016 ? ?FINDINGS: ?Lower chest: No acute abnormality. ? ?Hepatobiliary: Calcified granuloma in the right lobe. No focal ?hepatic lesion. Status post cholecystectomy. No biliary dilatation. ? ?Pancreas: Diffuse severe parenchymal atrophy and fatty infiltration. ?The main pancreatic duct is not dilated. No peripancreatic fat ?stranding or fluid  collection. ? ?Spleen: Normal size.  Multiple calcified granulomas. ? ?Adrenals/Urinary Tract: Right adrenal gland is unremarkable. ?Resolution of the fluid density at the left adrenal gland with ?interval development of calcificat

## 2022-01-26 ENCOUNTER — Encounter: Payer: Self-pay | Admitting: Urology

## 2022-01-26 ENCOUNTER — Ambulatory Visit (INDEPENDENT_AMBULATORY_CARE_PROVIDER_SITE_OTHER): Payer: Medicare Other | Admitting: Urology

## 2022-01-26 VITALS — BP 149/78 | HR 93 | Ht 72.0 in | Wt 159.4 lb

## 2022-01-26 DIAGNOSIS — N1339 Other hydronephrosis: Secondary | ICD-10-CM

## 2022-01-26 DIAGNOSIS — N138 Other obstructive and reflux uropathy: Secondary | ICD-10-CM

## 2022-01-26 DIAGNOSIS — R339 Retention of urine, unspecified: Secondary | ICD-10-CM | POA: Diagnosis not present

## 2022-01-26 LAB — BLADDER SCAN AMB NON-IMAGING: Scan Result: 138

## 2022-01-26 MED ORDER — SILODOSIN 8 MG PO CAPS: 8.0000 mg | ORAL_CAPSULE | Freq: Every day | ORAL | 11 refills | Status: DC

## 2022-01-26 MED ORDER — TAMSULOSIN HCL 0.4 MG PO CAPS
0.4000 mg | ORAL_CAPSULE | Freq: Every day | ORAL | 11 refills | Status: DC
Start: 2022-01-26 — End: 2022-03-21

## 2022-01-26 NOTE — Progress Notes (Signed)
? ?01/26/2022 ?1:10 PM  ? ?Chad Butler ?12/15/1950 ?[id number] ? ?Referring provider: Redmond School, MD ?[address] ?[place],  [place] [address] ? ?Followup bladder biopsy ? ? ?HPI: ?Chad Butler is a 71yo here for followup for bladder cancer and bilateral hydronephrosis. He underwent bladder biopsy and bilateral stent placement 1 week ago. Pathology benign. He is doing well with the stent in place. He denies flank pain. He has increased urinary frequency and urgency with the stent in place.  ? ? ?PMH: ?Past Medical History:  ?Diagnosis Date  ? Anxiety   ? Bladder cancer ([place])   ? Cancer [facility])   ? Depression   ? Hemorrhoids   ? Hypertension   ? ? ?Surgical History: ?Past Surgical History:  ?Procedure Laterality Date  ? ADRENALECTOMY    ? [facility]  ? CHOLECYSTECTOMY  2001  ? COLONOSCOPY  11/09/2011  ? Dr. Oneida Alar: two tubular adenomas, mild diverticulosis, internal hemorrhoids, surveillance in 10 years   ? CYST EXCISION  1970's  ? back  ? CYSTOSCOPY W/ RETROGRADES Bilateral 08/17/2021  ? Procedure: CYSTOSCOPY WITH RETROGRADE PYELOGRAM;  Surgeon: Cleon Gustin, MD;  Location: [facility];  Service: Urology;  Laterality: Bilateral;  ? CYSTOSCOPY W/ RETROGRADES Left 09/07/2021  ? Procedure: CYSTOSCOPY WITH RETROGRADE PYELOGRAM;  Surgeon: Cleon Gustin, MD;  Location: [facility];  Service: Urology;  Laterality: Left;  ? CYSTOSCOPY W/ URETERAL STENT PLACEMENT Bilateral 01/18/2022  ? Procedure: CYSTOSCOPY WITH RETROGRADE PYELOGRAM/ BILATERAL URETERAL STENT PLACEMENT;  Surgeon: Cleon Gustin, MD;  Location: [facility];  Service: Urology;  Laterality: Bilateral;  ? ESOPHAGOGASTRODUODENOSCOPY  10/07/2012  ? Dr. Arnoldo Morale: normal, Clotest negative  ? HERNIA REPAIR  1956  ? INGUINAL HERNIA REPAIR Right 11/04/2013  ? Procedure: RECURRENT RIGHT INGUINAL HERNIA REPAIR;  Surgeon: Jamesetta So, MD;  Location: [facility];  Service: General;  Laterality: Right;  ? Left shoulder surgery  2011  ? LESION REMOVAL N/A 11/04/2013  ?  Procedure: EXCISION OF SKIN LESION ABDOMINAL WALL;  Surgeon: Jamesetta So, MD;  Location: [facility];  Service: General;  Laterality: N/A;  ? TRANSURETHRAL RESECTION OF BLADDER TUMOR N/A 08/17/2021  ? Procedure: TRANSURETHRAL RESECTION OF BLADDER TUMOR (TURBT);  Surgeon: Cleon Gustin, MD;  Location: [facility];  Service: Urology;  Laterality: N/A;  ? TRANSURETHRAL RESECTION OF BLADDER TUMOR N/A 01/18/2022  ? Procedure: TRANSURETHRAL RESECTION OF BLADDER TUMOR (TURBT);  Surgeon: Cleon Gustin, MD;  Location: [facility];  Service: Urology;  Laterality: N/A;  ? URETEROSCOPY Left 09/07/2021  ? Procedure: URETEROSCOPY-DIAGNOSTIC;  Surgeon: Cleon Gustin, MD;  Location: [facility];  Service: Urology;  Laterality: Left;  ? ? ?Home Medications:  ?Allergies as of 01/26/2022   ? ?   Reactions  ? Red Dye Nausea And Vomiting  ? Gadolinium Nausea And Vomiting  ?  Code: VOM, with injection, no other reactions noted, Onset Date: [id number]  ? ?  ? ?  ?Medication List  ?  ? ?  ? Accurate as of January 26, 2022  1:10 PM. If you have any questions, ask your nurse or doctor.  ?  ?  ? ?  ? ?ALPRAZolam 0.5 MG tablet ?Commonly known as: Duanne Moron ?Take 0.5 mg by mouth at bedtime. For sleep. ?  ?benzonatate 200 MG capsule ?Commonly known as: TESSALON ?Take 200 mg by mouth 3 (three) times daily. ?  ?doxylamine (Sleep) 25 MG tablet ?Commonly known as: UNISOM ?Take 12.5 mg by mouth at bedtime as needed for sleep. ?  ?  ibuprofen 200 MG tablet ?Commonly known as: ADVIL ?Take 200 mg by mouth every 6 (six) hours as needed for headache or moderate pain. ?  ?multivitamin with minerals Tabs tablet ?Take 1 tablet by mouth daily. Centrum Silver ?  ?omeprazole 40 MG capsule ?Commonly known as: PRILOSEC ?Take 40 mg by mouth daily. ?  ?OVER THE COUNTER MEDICATION ?Apply 1 application. topically daily as needed (leg itching). Anti-itch cream ?  ?oxyCODONE-acetaminophen 5-325 MG tablet ?Commonly known as: Percocet ?Take 1 tablet by mouth every 4 (four) hours as  needed for severe pain. ?  ?rOPINIRole 0.5 MG tablet ?Commonly known as: REQUIP ?Take 0.5 mg by mouth at bedtime. ?  ?silodosin 8 MG Caps capsule ?Commonly known as: RAPAFLO ?Take 1 capsule (8 mg total) by mouth daily with breakfast. ?  ?SINUS/ALLERGY PE PO ?Take 1 tablet by mouth daily as needed (allergies). ?  ?tamsulosin 0.4 MG Caps capsule ?Commonly known as: FLOMAX ?Take 0.4 mg by mouth daily. ?  ? ?  ? ? ?Allergies:  ?Allergies  ?Allergen Reactions  ? Red Dye Nausea And Vomiting  ? Gadolinium Nausea And Vomiting  ?   Code: VOM, with injection, no other reactions noted, Onset Date: [id number] ?  ? ? ?Family History: ?Family History  ?Problem Relation Age of Onset  ? Hypertension Mother   ? Cancer Father   ? Colon cancer Neg Hx   ? Diabetes Neg Hx   ? Thyroid disease Neg Hx   ? ? ?Social History:  reports that he has never smoked. He has never used smokeless tobacco. He reports current alcohol use. He reports that he does not use drugs. ? ?ROS: ?All other review of systems were reviewed and are negative except what is noted above in HPI ? ?Physical Exam: ?BP (!) 149/78   Pulse 93   Ht 6' (1.829 m)   Wt 159 lb 6.2 oz (72.3 kg)   BMI 21.62 kg/m?   ?Constitutional:  Alert and oriented, No acute distress. ?HEENT: [place] AT, moist mucus membranes.  Trachea midline, no masses. ?Cardiovascular: No clubbing, cyanosis, or edema. ?Respiratory: Normal respiratory effort, no increased work of breathing. ?GI: Abdomen is soft, nontender, nondistended, no abdominal masses ?GU: No CVA tenderness.  ?Lymph: No cervical or inguinal lymphadenopathy. ?Skin: No rashes, bruises or suspicious lesions. ?Neurologic: Grossly intact, no focal deficits, moving all 4 extremities. ?Psychiatric: Normal mood and affect. ? ?Laboratory Data: ?Lab Results  ?Component Value Date  ? WBC 8.0 08/16/2021  ? HGB 16.6 08/16/2021  ? HCT 48.9 08/16/2021  ? MCV 96.3 08/16/2021  ? PLT 241 08/16/2021  ? ? ?Lab Results  ?Component Value Date  ? CREATININE 1.19  12/15/2021  ? ? ?No results found for: PSA ? ?No results found for: TESTOSTERONE ? ?No results found for: HGBA1C ? ?Urinalysis ?   ?Component Value Date/Time  ? COLORURINE STRAW (A) 07/07/2021 1047  ? APPEARANCEUR Clear 12/26/2021 1047  ? LABSPEC 1.002 (L) 07/07/2021 1047  ? PHURINE 7.0 07/07/2021 1047  ? GLUCOSEU Trace (A) 12/26/2021 1047  ? HGBUR LARGE (A) 07/07/2021 1047  ? BILIRUBINUR Negative 12/26/2021 1047  ? [place] NEGATIVE 07/07/2021 1047  ? PROTEINUR Negative 12/26/2021 1047  ? PROTEINUR NEGATIVE 07/07/2021 1047  ? UROBILINOGEN 1.0 02/22/2020 1612  ? UROBILINOGEN 2.0 (H) 11/04/2009 1425  ? NITRITE Negative 12/26/2021 1047  ? NITRITE NEGATIVE 07/07/2021 1047  ? LEUKOCYTESUR 1+ (A) 12/26/2021 1047  ? LEUKOCYTESUR TRACE (A) 07/07/2021 1047  ? ? ?Lab Results  ?Component Value Date  ?  LABMICR See below: 12/26/2021  ? WBCUA 6-10 (A) 12/26/2021  ? LABEPIT 0-10 12/26/2021  ? MUCUS Present 12/19/2021  ? BACTERIA Few (A) 12/26/2021  ? ? ?Pertinent Imaging: ? ?Results for orders placed during the hospital encounter of 01/14/04 ? ?DG Abd 1 View ? ?Narrative ?Clinical Data: Bladder calculi. ?ABDOMEN ONE VIEW ?CT scan on 03/11/03 raised the question of possible bladder calculi versus sessile bladder mass with calcification. Metallic clips, right upper quadrant secondary to cholecystectomy. Unremarkable bowel gas pattern. No definite urinary tract calcifications. There is small amount of stool in the rectum precluding optimal evaluation for bladder calculi. ?IMPRESSION ?Unremarkable plain film of the abdomen. See comments above. ? ?Provider: Briscoe Burns, Jonetta Speak ? ?No results found for this or any previous visit. ? ?No results found for this or any previous visit. ? ?No results found for this or any previous visit. ? ?Results for orders placed in visit on 11/14/21 ? ?US RENAL ? ?Narrative ?CLINICAL DATA:  Hydronephrosis ? ?EXAM: ?RENAL / URINARY TRACT ULTRASOUND COMPLETE ? ?COMPARISON:  CT renal  07/07/2021 ? ?FINDINGS: ?Right Kidney: ? ?Renal measurements: 13.5 x 7.5 x 7.2 cm = volume: 384 mL. ?Echogenicity within normal limits. No mass. Moderate hydronephrosis ?visualized. ? ?Left Kidney: ? ?Renal measurements: 12.6

## 2022-01-26 NOTE — Progress Notes (Signed)
post void residual =1105m ?

## 2022-01-30 NOTE — Patient Instructions (Signed)
Hydronephrosis  Hydronephrosis is the swelling of one or both kidneys due to a blockage that stops urine from flowing out of the body. Kidneys filter waste from the blood and produce urine. This condition can lead to kidney failure and may become life-threatening if not treated promptly. What are the causes? In infants and children, common causes include problems that occur when a baby is developing in the womb. These can include problems in the kidneys or in the tubes that drain urine into the bladder (ureters). In adults, common causes include: Kidney stones. Pregnancy. A tumor or cyst in the abdomen or pelvis. An enlarged prostate gland. Other causes include: Bladder infection. Scar tissue from a previous surgery or injury. A blood clot. Cancer of the prostate, bladder, uterus, ovary, or colon. What are the signs or symptoms? Symptoms of this condition include: Pain or discomfort in your side (flank) or abdomen. Swelling in your abdomen. Nausea and vomiting. Fever. Pain when passing urine. Feelings of urgency when you need to urinate. Urinating more often than normal. In some cases, you may not have any symptoms. How is this diagnosed? This condition may be diagnosed based on: Your symptoms and medical history. A physical exam. Blood and urine tests. Imaging tests, such as an ultrasound, CT scan, or MRI. A procedure to look at your urinary tract and bladder by inserting a scope into the urethra (cystoscopy). How is this treated? Treatment for this condition depends on where the blockage is, how long it has been there, and what caused it. The goal of treatment is to remove the blockage. Treatment may include: Antibiotic medicines to treat or prevent infection. A procedure to place a small, thin tube (stent) into a blocked ureter. The stent will keep the ureter open so that urine can drain through it. A nonsurgical procedure that crushes kidney stones with shock waves  (extracorporeal shock wave lithotripsy). If kidney failure occurs, treatment may include dialysis or a kidney transplant. Follow these instructions at home:  Take over-the-counter and prescription medicines only as told by your health care provider. If you were prescribed an antibiotic medicine, take it exactly as told by your health care provider. Do not stop taking the antibiotic even if you start to feel better. Rest and return to your normal activities as told by your health care provider. Ask your health care provider what activities are safe for you. Drink enough fluid to keep your urine pale yellow. Keep all follow-up visits. This is important. Contact a health care provider if: You continue to have symptoms after treatment. You develop new symptoms. Your urine becomes cloudy or bloody. You have a fever. Get help right away if: You have severe flank or abdominal pain. You cannot drink fluids without vomiting. Summary Hydronephrosis is the swelling of one or both kidneys due to a blockage that stops urine from flowing out of the body. Hydronephrosis can lead to kidney failure and may become life-threatening if not treated promptly. The goal of treatment is to remove the blockage. It may include a procedure to insert a stent into a blocked ureter, a procedure to break up kidney stones, or taking antibiotic medicines. Follow your health care provider's instructions for taking care of yourself at home, including instructions about drinking fluids, taking medicines, and limiting activities. This information is not intended to replace advice given to you by your health care provider. Make sure you discuss any questions you have with your health care provider. Document Revised: 01/05/2020 Document Reviewed: 01/05/2020 Elsevier   Patient Education  2023 Elsevier Inc.  

## 2022-02-02 ENCOUNTER — Ambulatory Visit: Payer: Medicare Other

## 2022-02-07 ENCOUNTER — Ambulatory Visit (INDEPENDENT_AMBULATORY_CARE_PROVIDER_SITE_OTHER): Payer: Self-pay | Admitting: *Deleted

## 2022-02-07 VITALS — Ht 72.0 in | Wt 156.0 lb

## 2022-02-07 DIAGNOSIS — Z86010 Personal history of colonic polyps: Secondary | ICD-10-CM

## 2022-02-07 NOTE — Progress Notes (Addendum)
Gastroenterology Pre-Procedure Review  Request Date: 02/07/2022 Requesting Physician: Last TCS 11/09/2011 by Dr. Oneida Alar, tubular adenomas (x2), mild diverticulosis, internal hemorrhoids, 10 year recall, family hx of colon cancer (father)  PATIENT REVIEW QUESTIONS: The patient responded to the following health history questions as indicated:    1. Diabetes Melitis: no 2. Joint replacements in the past 12 months: no 3. Major health problems in the past 3 months: yes, kidney stent placed in April 4. Has an artificial valve or MVP: no 5. Has a defibrillator: no 6. Has been advised in past to take antibiotics in advance of a procedure like teeth cleaning: no 7. Family history of colon cancer: yes, father: age unknown  17. Alcohol Use: yes, 8 ounces of wine or cocktail once a week 9. Illicit drug Use: no 10. History of sleep apnea: no  11. History of coronary artery or other vascular stents placed within the last 12 months: no 12. History of any prior anesthesia complications: no 13. Body mass index is 21.16 kg/m.    MEDICATIONS & ALLERGIES:    Patient reports the following regarding taking any blood thinners:   Plavix? no Aspirin? Yes, as needed Coumadin? no Brilinta? no Xarelto? no Eliquis? no Pradaxa? no Savaysa? no Effient? no  Patient confirms/reports the following medications:  Current Outpatient Medications  Medication Sig Dispense Refill   ALPRAZolam (XANAX) 0.5 MG tablet Take 0.5 mg by mouth at bedtime. For sleep.     aspirin EC 81 MG tablet Take 81 mg by mouth as needed. Swallow whole.     benzonatate (TESSALON) 200 MG capsule Take 200 mg by mouth 3 (three) times daily.     Chlorpheniramine-Phenylephrine (SINUS/ALLERGY PE PO) Take 1 tablet by mouth daily as needed (allergies).     doxylamine, Sleep, (UNISOM) 25 MG tablet Take 12.5 mg by mouth at bedtime as needed for sleep.     ibuprofen (ADVIL) 200 MG tablet Take 200 mg by mouth as needed for headache or moderate pain.      Multiple Vitamin (MULTIVITAMIN WITH MINERALS) TABS tablet Take 1 tablet by mouth daily. Centrum Silver     omeprazole (PRILOSEC) 40 MG capsule Take 40 mg by mouth daily.     OVER THE COUNTER MEDICATION Apply 1 application. topically daily as needed (leg itching). Anti-itch cream     oxyCODONE-acetaminophen (PERCOCET) 5-325 MG tablet Take 1 tablet by mouth every 4 (four) hours as needed for severe pain. (Patient taking differently: Take 1 tablet by mouth as needed for severe pain. Takes 1/2 tablet as needed.) 15 tablet 0   rOPINIRole (REQUIP) 0.5 MG tablet Take 0.5 mg by mouth at bedtime.     silodosin (RAPAFLO) 8 MG CAPS capsule Take 1 capsule (8 mg total) by mouth daily with breakfast. 30 capsule 11   tamsulosin (FLOMAX) 0.4 MG CAPS capsule Take 1 capsule (0.4 mg total) by mouth daily. 30 capsule 11   No current facility-administered medications for this visit.    Patient confirms/reports the following allergies:  Allergies  Allergen Reactions   Red Dye Nausea And Vomiting   Gadolinium Nausea And Vomiting     Code: VOM, with injection, no other reactions noted, Onset Date: [id number]     No orders of the defined types were placed in this encounter.   AUTHORIZATION INFORMATION Primary Insurance: Medicare,  ID #: [id number] Pre-Cert / Auth required: No, not required  Secondary Insurance: [employer],  [place] #: [id number],  Group #: [id number] Pre-Cert / Auth required: No, not required  SCHEDULE INFORMATION: Procedure has been scheduled as follows:  Date: 03/20/2022, Time: 9:30 Location: [facility] with Dr. Abbey Chatters  This Gastroenterology Pre-Precedure Review Form is being routed to the following provider(s): Roseanne Kaufman, NP

## 2022-02-13 NOTE — Progress Notes (Signed)
ASA 2. Appropriate.  ?

## 2022-02-14 DIAGNOSIS — Z6820 Body mass index (BMI) 20.0-20.9, adult: Secondary | ICD-10-CM | POA: Diagnosis not present

## 2022-02-14 DIAGNOSIS — J069 Acute upper respiratory infection, unspecified: Secondary | ICD-10-CM | POA: Diagnosis not present

## 2022-02-14 NOTE — Progress Notes (Signed)
02/14/2022-Lmom on both numbers for pt to call me back. ?

## 2022-02-15 ENCOUNTER — Encounter: Payer: Self-pay | Admitting: *Deleted

## 2022-02-15 MED ORDER — PEG 3350-KCL-NA BICARB-NACL 420 G PO SOLR: 4000.0000 mL | Freq: Once | ORAL | 0 refills | Status: AC

## 2022-02-15 NOTE — Progress Notes (Signed)
Spoke to pt.  Scheduled procedure for 03/20/2022 at 9:30, arrival 8:00 at [facility]  Reviewed prep instructions with pt by phone.  Pt aware RX sent to his pharmacy.  He is aware that he will need an enema and dulcolax (OTC items).  Confirmed mailing address and mailed instructions.

## 2022-02-15 NOTE — Addendum Note (Signed)
Addended by: Metro Kung on: 02/15/2022 02:38 PM   Modules accepted: Orders

## 2022-02-20 ENCOUNTER — Other Ambulatory Visit: Payer: Medicare Other | Admitting: Urology

## 2022-02-20 DIAGNOSIS — R3914 Feeling of incomplete bladder emptying: Secondary | ICD-10-CM | POA: Diagnosis not present

## 2022-03-06 ENCOUNTER — Ambulatory Visit: Payer: Medicare Other | Admitting: Urology

## 2022-03-16 ENCOUNTER — Other Ambulatory Visit: Payer: Self-pay | Admitting: *Deleted

## 2022-03-16 MED ORDER — PEG 3350-KCL-NA BICARB-NACL 420 G PO SOLR: ORAL | 0 refills | Status: DC

## 2022-03-20 ENCOUNTER — Encounter (HOSPITAL_COMMUNITY): Payer: Self-pay

## 2022-03-20 ENCOUNTER — Other Ambulatory Visit: Payer: Self-pay

## 2022-03-20 ENCOUNTER — Ambulatory Visit (HOSPITAL_COMMUNITY)
Admission: RE | Admit: 2022-03-20 | Discharge: 2022-03-20 | Disposition: A | Discharge status: Home / Self Care | Payer: Medicare Other | Attending: Internal Medicine | Admitting: Internal Medicine

## 2022-03-20 ENCOUNTER — Ambulatory Visit (HOSPITAL_COMMUNITY): Payer: Medicare Other | Admitting: Anesthesiology

## 2022-03-20 ENCOUNTER — Encounter (HOSPITAL_COMMUNITY)
Admission: RE | Disposition: A | Discharge status: Home / Self Care | Payer: Self-pay | Source: Home / Self Care | Attending: Internal Medicine

## 2022-03-20 ENCOUNTER — Ambulatory Visit (HOSPITAL_BASED_OUTPATIENT_CLINIC_OR_DEPARTMENT_OTHER): Payer: Medicare Other | Admitting: Anesthesiology

## 2022-03-20 DIAGNOSIS — K635 Polyp of colon: Secondary | ICD-10-CM | POA: Diagnosis not present

## 2022-03-20 DIAGNOSIS — K648 Other hemorrhoids: Secondary | ICD-10-CM | POA: Insufficient documentation

## 2022-03-20 DIAGNOSIS — Z800 Family history of malignant neoplasm of digestive organs: Secondary | ICD-10-CM | POA: Insufficient documentation

## 2022-03-20 DIAGNOSIS — G8929 Other chronic pain: Secondary | ICD-10-CM

## 2022-03-20 DIAGNOSIS — Z8551 Personal history of malignant neoplasm of bladder: Secondary | ICD-10-CM | POA: Diagnosis not present

## 2022-03-20 DIAGNOSIS — D123 Benign neoplasm of transverse colon: Secondary | ICD-10-CM | POA: Insufficient documentation

## 2022-03-20 DIAGNOSIS — K5730 Diverticulosis of large intestine without perforation or abscess without bleeding: Secondary | ICD-10-CM | POA: Diagnosis not present

## 2022-03-20 DIAGNOSIS — K649 Unspecified hemorrhoids: Secondary | ICD-10-CM

## 2022-03-20 DIAGNOSIS — F32A Depression, unspecified: Secondary | ICD-10-CM | POA: Diagnosis not present

## 2022-03-20 DIAGNOSIS — Z09 Encounter for follow-up examination after completed treatment for conditions other than malignant neoplasm: Secondary | ICD-10-CM | POA: Diagnosis not present

## 2022-03-20 DIAGNOSIS — F418 Other specified anxiety disorders: Secondary | ICD-10-CM | POA: Diagnosis not present

## 2022-03-20 DIAGNOSIS — K219 Gastro-esophageal reflux disease without esophagitis: Secondary | ICD-10-CM | POA: Insufficient documentation

## 2022-03-20 DIAGNOSIS — N138 Other obstructive and reflux uropathy: Secondary | ICD-10-CM

## 2022-03-20 DIAGNOSIS — R1110 Vomiting, unspecified: Secondary | ICD-10-CM

## 2022-03-20 DIAGNOSIS — R059 Cough, unspecified: Secondary | ICD-10-CM

## 2022-03-20 DIAGNOSIS — I10 Essential (primary) hypertension: Secondary | ICD-10-CM | POA: Diagnosis not present

## 2022-03-20 DIAGNOSIS — Z1211 Encounter for screening for malignant neoplasm of colon: Secondary | ICD-10-CM | POA: Insufficient documentation

## 2022-03-20 DIAGNOSIS — F419 Anxiety disorder, unspecified: Secondary | ICD-10-CM | POA: Diagnosis not present

## 2022-03-20 DIAGNOSIS — Z86010 Personal history of colonic polyps: Secondary | ICD-10-CM | POA: Diagnosis not present

## 2022-03-20 DIAGNOSIS — N529 Male erectile dysfunction, unspecified: Secondary | ICD-10-CM

## 2022-03-20 DIAGNOSIS — D124 Benign neoplasm of descending colon: Secondary | ICD-10-CM | POA: Diagnosis not present

## 2022-03-20 HISTORY — PX: COLONOSCOPY WITH PROPOFOL: SHX5780

## 2022-03-20 HISTORY — PX: POLYPECTOMY: SHX5525

## 2022-03-20 SURGERY — COLONOSCOPY WITH PROPOFOL
Anesthesia: General

## 2022-03-20 MED ORDER — PROPOFOL 10 MG/ML IV BOLUS
INTRAVENOUS | Status: DC | PRN
  Administered 2022-03-20: 20 mg via INTRAVENOUS
  Administered 2022-03-20: 100 mg via INTRAVENOUS
  Administered 2022-03-20: 30 mg via INTRAVENOUS
  Administered 2022-03-20: 50 mg via INTRAVENOUS
  Administered 2022-03-20: 20 mg via INTRAVENOUS

## 2022-03-20 MED ORDER — LACTATED RINGERS IV SOLN: INTRAVENOUS | Status: DC

## 2022-03-20 MED ORDER — LIDOCAINE HCL (CARDIAC) PF 100 MG/5ML IV SOSY
PREFILLED_SYRINGE | INTRAVENOUS | Status: DC | PRN
  Administered 2022-03-20: 50 mg via INTRAVENOUS

## 2022-03-20 NOTE — Op Note (Signed)
[facility] Patient Name: Chad Butler Procedure Date: 03/20/2022 8:12 AM MRN: [id number] Date of Birth: October 13, 1950 Attending MD: Elon Alas. Abbey Chatters DO CSN: [id number] Age: 71 Admit Type: Outpatient Procedure:                Colonoscopy Indications:              Surveillance: Personal history of adenomatous                            polyps on last colonoscopy > 5 years ago Providers:                Elon Alas. Abbey Chatters, DO, Janeece Riggers, RN, [place] Page Referring MD:              Medicines:                See the Anesthesia note for documentation of the                            administered medications Complications:            No immediate complications. Estimated Blood Loss:     Estimated blood loss was minimal. Procedure:                Pre-Anesthesia Assessment:                           - The anesthesia plan was to use monitored                            anesthesia care (MAC).                           After obtaining informed consent, the colonoscope                            was passed under direct vision. Throughout the                            procedure, the patient's blood pressure, pulse, and                            oxygen saturations were monitored continuously. The                            PCF-HQ190L ([id number]) scope was introduced through                            the anus and advanced to the the cecum, identified                            by appendiceal orifice and ileocecal valve. The                            colonoscopy was performed without difficulty. The  patient tolerated the procedure well. The quality                            of the bowel preparation was evaluated using the                            BBPS [facility] Bowel Preparation Scale) with scores                            of: Right Colon = 3, Transverse Colon = 3 and Left                            Colon = 3 (entire mucosa seen well with no residual                             staining, small fragments of stool or opaque                            liquid). The total BBPS score equals 9. Scope In: 8:43:53 AM Scope Out: 8:59:30 AM Scope Withdrawal Time: 0 hours 13 minutes 38 seconds  Total Procedure Duration: 0 hours 15 minutes 37 seconds  Findings:      Hemorrhoids were found on perianal exam.      Non-bleeding internal hemorrhoids were found during endoscopy.      Scattered small and large-mouthed diverticula were found in the sigmoid       colon, descending colon and transverse colon.      Four sessile polyps were found in the transverse colon. The polyps were       4 to 6 mm in size. These polyps were removed with a cold snare.       Resection and retrieval were complete.      The exam was otherwise without abnormality. Impression:               - Hemorrhoids found on perianal exam.                           - Non-bleeding internal hemorrhoids.                           - Diverticulosis in the sigmoid colon, in the                            descending colon and in the transverse colon.                           - Four 4 to 6 mm polyps in the transverse colon,                            removed with a cold snare. Resected and retrieved.                           - The examination was otherwise normal. Moderate Sedation:      Per Anesthesia Care Recommendation:           -  Patient has a contact number available for                            emergencies. The signs and symptoms of potential                            delayed complications were discussed with the                            patient. Return to normal activities tomorrow.                            Written discharge instructions were provided to the                            patient.                           - Resume previous diet.                           - Continue present medications.                           - Await pathology results.                           - Repeat  colonoscopy in 5 years for surveillance.                           - Return to GI clinic PRN. Procedure Code(s):        --- Professional ---                           [phone], Colonoscopy, flexible; with removal of                            tumor(s), polyp(s), or other lesion(s) by snare                            technique Diagnosis Code(s):        --- Professional ---                           Z86.010, Personal history of colonic polyps                           K64.8, Other hemorrhoids                           K63.5, Polyp of colon                           K57.30, Diverticulosis of large intestine without                            perforation or abscess without bleeding CPT  copyright 2019 American Medical Association. All rights reserved. The codes documented in this report are preliminary and upon coder review may  be revised to meet current compliance requirements. Elon Alas. Abbey Chatters, DO [place] Abbey Chatters, DO 03/20/2022 9:08:14 AM This report has been signed electronically. Number of Addenda: 0

## 2022-03-20 NOTE — Transfer of Care (Signed)
Immediate Anesthesia Transfer of Care Note  Patient: Faisal Stradling [facility]  Procedure(s) Performed: COLONOSCOPY WITH PROPOFOL POLYPECTOMY  Patient Location: Endoscopy Unit  Anesthesia Type:General  Level of Consciousness: drowsy  Airway & Oxygen Therapy: Patient Spontanous Breathing  Post-op Assessment: Report given to RN and Post -op Vital signs reviewed and stable  Post vital signs: Reviewed and stable  Last Vitals:  Vitals Value Taken Time  BP    Temp    Pulse 79   Resp 20   SpO2 98%     Last Pain:  Vitals:   03/20/22 0826  TempSrc: Oral  PainSc: 0-No pain      Patients Stated Pain Goal: 2 (05/14/87 7195)  Complications: No notable events documented.

## 2022-03-20 NOTE — Anesthesia Postprocedure Evaluation (Signed)
Anesthesia Post Note  Patient: Chad Butler [facility]  Procedure(s) Performed: COLONOSCOPY WITH PROPOFOL POLYPECTOMY  Patient location during evaluation: Endoscopy Anesthesia Type: General Level of consciousness: awake and alert and oriented Pain management: pain level controlled Vital Signs Assessment: post-procedure vital signs reviewed and stable Respiratory status: spontaneous breathing, nonlabored ventilation and respiratory function stable Cardiovascular status: blood pressure returned to baseline and stable Postop Assessment: no apparent nausea or vomiting Anesthetic complications: no   No notable events documented.   Last Vitals:  Vitals:   03/20/22 0902 03/20/22 0907  BP: 91/64 (!) 97/58  Resp: 18   Temp: (!) 36.4 C   SpO2: 97%     Last Pain:  Vitals:   03/20/22 0902  TempSrc: Axillary  PainSc: 0-No pain                 Mateo Overbeck C Renleigh Ouellet

## 2022-03-20 NOTE — H&P (Signed)
Primary Care Physician:  Redmond School, MD Primary Gastroenterologist:  Dr. Abbey Chatters  Pre-Procedure History & Physical: HPI:  Chad Butler is a 71 y.o. male is here for a colonoscopy to be performed for surveillance purposes. Last TCS 11/09/2011 by Dr. Oneida Alar, tubular adenomas (x2), mild diverticulosis, internal hemorrhoids, 10 year recall, family hx of colon cancer (father) unknown age.   Past Medical History:  Diagnosis Date   Anxiety    Bladder cancer ([place])    Cancer ([place])    Depression    Hemorrhoids    Hypertension     Past Surgical History:  Procedure Laterality Date   ADRENALECTOMY     [facility]   CHOLECYSTECTOMY  2001   COLONOSCOPY  11/09/2011   Dr. Oneida Alar: two tubular adenomas, mild diverticulosis, internal hemorrhoids, surveillance in 10 years    CYST EXCISION  1970's   back   CYSTOSCOPY W/ RETROGRADES Bilateral 08/17/2021   Procedure: CYSTOSCOPY WITH RETROGRADE PYELOGRAM;  Surgeon: Cleon Gustin, MD;  Location: [facility];  Service: Urology;  Laterality: Bilateral;   CYSTOSCOPY W/ RETROGRADES Left 09/07/2021   Procedure: CYSTOSCOPY WITH RETROGRADE PYELOGRAM;  Surgeon: Cleon Gustin, MD;  Location: [facility];  Service: Urology;  Laterality: Left;   CYSTOSCOPY W/ URETERAL STENT PLACEMENT Bilateral 01/18/2022   Procedure: CYSTOSCOPY WITH RETROGRADE PYELOGRAM/ BILATERAL URETERAL STENT PLACEMENT;  Surgeon: Cleon Gustin, MD;  Location: [facility];  Service: Urology;  Laterality: Bilateral;   ESOPHAGOGASTRODUODENOSCOPY  10/07/2012   Dr. Arnoldo Morale: normal, Clotest negative   HERNIA REPAIR  1956   INGUINAL HERNIA REPAIR Right 11/04/2013   Procedure: RECURRENT RIGHT INGUINAL HERNIA REPAIR;  Surgeon: Jamesetta So, MD;  Location: [facility];  Service: General;  Laterality: Right;   Left shoulder surgery  2011   LESION REMOVAL N/A 11/04/2013   Procedure: EXCISION OF SKIN LESION ABDOMINAL WALL;  Surgeon: Jamesetta So, MD;  Location: [facility];  Service: General;  Laterality: N/A;    TRANSURETHRAL RESECTION OF BLADDER TUMOR N/A 08/17/2021   Procedure: TRANSURETHRAL RESECTION OF BLADDER TUMOR (TURBT);  Surgeon: Cleon Gustin, MD;  Location: [facility];  Service: Urology;  Laterality: N/A;   TRANSURETHRAL RESECTION OF BLADDER TUMOR N/A 01/18/2022   Procedure: TRANSURETHRAL RESECTION OF BLADDER TUMOR (TURBT);  Surgeon: Cleon Gustin, MD;  Location: [facility];  Service: Urology;  Laterality: N/A;   URETEROSCOPY Left 09/07/2021   Procedure: URETEROSCOPY-DIAGNOSTIC;  Surgeon: Cleon Gustin, MD;  Location: [facility];  Service: Urology;  Laterality: Left;    Prior to Admission medications   Medication Sig Start Date End Date Taking? Authorizing Provider  ALPRAZolam Duanne Moron) 0.5 MG tablet Take 0.5 mg by mouth at bedtime. For sleep.   Yes [provider]  aspirin EC 81 MG tablet Take 81 mg by mouth 3 (three) times a week. Swallow whole.   Yes [provider]  benzonatate (TESSALON) 200 MG capsule Take 200 mg by mouth in the morning. 01/08/22  Yes [provider]  Chlorpheniramine-Phenylephrine (SINUS/ALLERGY PE PO) Take 1 tablet by mouth daily as needed (allergies).   Yes [provider]  diphenhydrAMINE (BENADRYL) 2 % cream Apply 1 Application topically 3 (three) times daily as needed for itching.   Yes [provider]  doxylamine, Sleep, (UNISOM) 25 MG tablet Take 12.5 mg by mouth at bedtime as needed for sleep.   Yes [provider]  ibuprofen (ADVIL) 200 MG tablet Take 200 mg by mouth every 8 (eight) hours as needed for headache or moderate pain.  Yes [provider]  Multiple Vitamin (MULTIVITAMIN WITH MINERALS) TABS tablet Take 1 tablet by mouth daily. Centrum Silver   Yes [provider]  naproxen sodium (ALEVE) 220 MG tablet Take 220 mg by mouth 2 (two) times daily as needed (pain.).   Yes [provider]  omeprazole (PRILOSEC) 40 MG capsule Take 40 mg by mouth daily with lunch.   Yes [provider]  oxyCODONE-acetaminophen (PERCOCET) 5-325 MG tablet Take 1 tablet by mouth every 4 (four) hours as needed for severe pain. Patient taking differently: Take 0.5-1 tablets by mouth every 6 (six) hours as needed for severe pain. 01/18/22 01/18/23 Yes McKenzie, Candee Furbish, MD  rOPINIRole (REQUIP) 0.5 MG tablet Take 0.5 mg by mouth at bedtime. 10/13/21  Yes [provider]  silodosin (RAPAFLO) 8 MG CAPS capsule Take 1 capsule (8 mg total) by mouth daily with breakfast. 01/26/22  Yes McKenzie, Candee Furbish, MD  tamsulosin (FLOMAX) 0.4 MG CAPS capsule Take 1 capsule (0.4 mg total) by mouth daily. Patient taking differently: Take 0.4 mg by mouth daily with lunch. 01/26/22  Yes McKenzie, Candee Furbish, MD  polyethylene glycol-electrolytes (NULYTELY) 420 g solution As directed 03/16/22   Eloise Harman, DO  pantoprazole (PROTONIX) 40 MG tablet Take 1 tablet (40 mg total) by mouth daily. Take 30 minutes before breakfast. 03/08/17 02/22/20  Annitta Needs, NP    Allergies as of 02/15/2022 - Review Complete 02/07/2022  Allergen Reaction Noted   Red dye Nausea And Vomiting 11/17/2014   Gadolinium Nausea And Vomiting 04/18/2010    Family History  Problem Relation Age of Onset   Hypertension Mother    Cancer Father    Colon cancer Neg Hx    Diabetes Neg Hx    Thyroid disease Neg Hx     Social History   Socioeconomic History   Marital status: Widowed    Spouse name: Not on file   Number of children: Not on file   Years of education: Not on file   Highest education level: Not on file  Occupational History   Occupation: Web designer  Tobacco Use   Smoking status: Never   Smokeless tobacco: Never  Vaping Use   Vaping Use: Never used  Substance and Sexual Activity   Alcohol use: Yes    Comment: 2 oz wine some days   Drug use: No   Sexual activity: Not on file  Other Topics Concern   Not on file  Social History Narrative   Not on file   Social Determinants of Health    Financial Resource Strain: Not on file  Food Insecurity: Not on file  Transportation Needs: Not on file  Physical Activity: Not on file  Stress: Not on file  Social Connections: Not on file  Intimate Partner Violence: Not on file    Review of Systems: See HPI, otherwise negative ROS  Physical Exam: Vital signs in last 24 hours: Temp:  [97.7 F (36.5 C)] 97.7 F (36.5 C) (06/20 0826) Resp:  [19] 19 (06/20 0826) BP: (141)/(74) 141/74 (06/20 0826) SpO2:  [100 %] 100 % (06/20 0826) Weight:  [70.3 kg] 70.3 kg (06/20 0826)   General:   Alert,  Well-developed, well-nourished, pleasant and cooperative in NAD Head:  Normocephalic and atraumatic. Eyes:  Sclera clear, no icterus.   Conjunctiva pink. Ears:  Normal auditory acuity. Nose:  No deformity, discharge,  or lesions. Mouth:  No deformity or lesions, dentition normal. Neck:  Supple; no masses or thyromegaly.  Lungs:  Clear throughout to auscultation.   No wheezes, crackles, or rhonchi. No acute distress. Heart:  Regular rate and rhythm; no murmurs, clicks, rubs,  or gallops. Abdomen:  Soft, nontender and nondistended. No masses, hepatosplenomegaly or hernias noted. Normal bowel sounds, without guarding, and without rebound.   Msk:  Symmetrical without gross deformities. Normal posture. Extremities:  Without clubbing or edema. Neurologic:  Alert and  oriented x4;  grossly normal neurologically. Skin:  Intact without significant lesions or rashes. Cervical Nodes:  No significant cervical adenopathy. Psych:  Alert and cooperative. Normal mood and affect.  Impression/Plan: Chad Butler is here for a colonoscopy to be performed for surveillance purposes. Last TCS 11/09/2011 by Dr. Oneida Alar, tubular adenomas (x2), mild diverticulosis, internal hemorrhoids, 10 year recall, family hx of colon cancer (father) unknown age.   The risks of the procedure including infection, bleed, or perforation as well as benefits, limitations,  alternatives and imponderables have been reviewed with the patient. Questions have been answered. All parties agreeable.

## 2022-03-20 NOTE — Anesthesia Preprocedure Evaluation (Addendum)
Anesthesia Evaluation  Patient identified by MRN, date of birth, ID band Patient awake    Reviewed: Allergy & Precautions, NPO status , Patient's Chart, lab work & pertinent test results  Airway Mallampati: II  TM Distance: >3 FB Neck ROM: Full    Dental  (+) Dental Advisory Given, Missing   Pulmonary neg pulmonary ROS,    Pulmonary exam normal breath sounds clear to auscultation       Cardiovascular hypertension, Normal cardiovascular exam Rhythm:Regular Rate:Normal     Neuro/Psych PSYCHIATRIC DISORDERS Anxiety Depression negative neurological ROS     GI/Hepatic Neg liver ROS, GERD  Medicated and Controlled,  Endo/Other  negative endocrine ROS  Renal/GU negative Renal ROS Bladder dysfunction (bladder cancer)      Musculoskeletal negative musculoskeletal ROS (+)   Abdominal   Peds negative pediatric ROS (+)  Hematology negative hematology ROS (+)   Anesthesia Other Findings   Reproductive/Obstetrics negative OB ROS                            Anesthesia Physical Anesthesia Plan  ASA: 2  Anesthesia Plan: General   Post-op Pain Management: Minimal or no pain anticipated   Induction: Intravenous  PONV Risk Score and Plan: Propofol infusion  Airway Management Planned: Nasal Cannula and Natural Airway  Additional Equipment:   Intra-op Plan:   Post-operative Plan:   Informed Consent: I have reviewed the patients History and Physical, chart, labs and discussed the procedure including the risks, benefits and alternatives for the proposed anesthesia with the patient or authorized representative who has indicated his/her understanding and acceptance.     Dental advisory given  Plan Discussed with: CRNA and Surgeon  Anesthesia Plan Comments:         Anesthesia Quick Evaluation

## 2022-03-20 NOTE — Discharge Instructions (Addendum)
  Colonoscopy Discharge Instructions  Read the instructions outlined below and refer to this sheet in the next few weeks. These discharge instructions provide you with general information on caring for yourself after you leave the hospital. Your doctor may also give you specific instructions. While your treatment has been planned according to the most current medical practices available, unavoidable complications occasionally occur.   ACTIVITY You may resume your regular activity, but move at a slower pace for the next 24 hours.  Take frequent rest periods for the next 24 hours.  Walking will help get rid of the air and reduce the bloated feeling in your belly (abdomen).  No driving for 24 hours (because of the medicine (anesthesia) used during the test).   Do not sign any important legal documents or operate any machinery for 24 hours (because of the anesthesia used during the test).  NUTRITION Drink plenty of fluids.  You may resume your normal diet as instructed by your doctor.  Begin with a light meal and progress to your normal diet. Heavy or fried foods are harder to digest and may make you feel sick to your stomach (nauseated).  Avoid alcoholic beverages for 24 hours or as instructed.  MEDICATIONS You may resume your normal medications unless your doctor tells you otherwise.  WHAT YOU CAN EXPECT TODAY Some feelings of bloating in the abdomen.  Passage of more gas than usual.  Spotting of blood in your stool or on the toilet paper.  IF YOU HAD POLYPS REMOVED DURING THE COLONOSCOPY: No aspirin products for 7 days or as instructed.  No alcohol for 7 days or as instructed.  Eat a soft diet for the next 24 hours.  FINDING OUT THE RESULTS OF YOUR TEST Not all test results are available during your visit. If your test results are not back during the visit, make an appointment with your caregiver to find out the results. Do not assume everything is normal if you have not heard from your  caregiver or the medical facility. It is important for you to follow up on all of your test results.  SEEK IMMEDIATE MEDICAL ATTENTION IF: You have more than a spotting of blood in your stool.  Your belly is swollen (abdominal distention).  You are nauseated or vomiting.  You have a temperature over 101.  You have abdominal pain or discomfort that is severe or gets worse throughout the day.   Your colonoscopy revealed 4 polyp(s) which I removed successfully. Await pathology results, my office will contact you. I recommend repeating colonoscopy in 5 years for surveillance purposes.   You also have diverticulosis and internal hemorrhoids. I would recommend increasing fiber in your diet or adding OTC Benefiber/Metamucil. Be sure to drink at least 4 to 6 glasses of water daily. Follow-up with GI as needed.   I hope you have a great rest of your week!  Charles K. Carver, D.O. Gastroenterology and Hepatology [facility]  

## 2022-03-21 ENCOUNTER — Ambulatory Visit (INDEPENDENT_AMBULATORY_CARE_PROVIDER_SITE_OTHER): Payer: Medicare Other | Admitting: Urology

## 2022-03-21 ENCOUNTER — Encounter: Payer: Self-pay | Admitting: Urology

## 2022-03-21 VITALS — BP 111/64 | HR 98

## 2022-03-21 DIAGNOSIS — N138 Other obstructive and reflux uropathy: Secondary | ICD-10-CM

## 2022-03-21 DIAGNOSIS — R339 Retention of urine, unspecified: Secondary | ICD-10-CM

## 2022-03-21 DIAGNOSIS — N401 Enlarged prostate with lower urinary tract symptoms: Secondary | ICD-10-CM

## 2022-03-21 DIAGNOSIS — C678 Malignant neoplasm of overlapping sites of bladder: Secondary | ICD-10-CM

## 2022-03-21 DIAGNOSIS — N1330 Unspecified hydronephrosis: Secondary | ICD-10-CM | POA: Diagnosis not present

## 2022-03-21 LAB — URINALYSIS, ROUTINE W REFLEX MICROSCOPIC
Bilirubin, UA: NEGATIVE
Glucose, UA: NEGATIVE
Ketones, UA: NEGATIVE
Nitrite, UA: NEGATIVE
Specific Gravity, UA: 1.02 (ref 1.005–1.030)
Urobilinogen, Ur: 0.2 mg/dL (ref 0.2–1.0)
pH, UA: 6 (ref 5.0–7.5)

## 2022-03-21 LAB — MICROSCOPIC EXAMINATION
Bacteria, UA: NONE SEEN
RBC, Urine: >30 /hpf — AB (ref 0–2)
Renal Epithel, UA: NONE SEEN /hpf
WBC, UA: >30 /hpf — AB (ref 0–5)

## 2022-03-21 LAB — SURGICAL PATHOLOGY

## 2022-03-21 MED ORDER — SILODOSIN 8 MG PO CAPS: 8.0000 mg | ORAL_CAPSULE | Freq: Two times a day (BID) | ORAL | 11 refills | Status: DC

## 2022-03-21 NOTE — Progress Notes (Incomplete)
03/21/2022 10:29 AM   Chad Butler [birth year] [id number]  Referring provider: [employer], [place] [address] [place],  [place] [address]  No chief complaint on file.   HPI:    PMH: Past Medical History:  Diagnosis Date   Anxiety    Bladder cancer ([place])    Cancer ([place])    Depression    Hemorrhoids    Hypertension     Surgical History: Past Surgical History:  Procedure Laterality Date   ADRENALECTOMY     [facility]   CHOLECYSTECTOMY  2001   COLONOSCOPY  11/09/2011   Dr. Oneida Alar: two tubular adenomas, mild diverticulosis, internal hemorrhoids, surveillance in 10 years    CYST EXCISION  1970's   back   CYSTOSCOPY W/ RETROGRADES Bilateral 08/17/2021   Procedure: CYSTOSCOPY WITH RETROGRADE PYELOGRAM;  Surgeon: Cleon Gustin, MD;  Location: [facility];  Service: Urology;  Laterality: Bilateral;   CYSTOSCOPY W/ RETROGRADES Left 09/07/2021   Procedure: CYSTOSCOPY WITH RETROGRADE PYELOGRAM;  Surgeon: Cleon Gustin, MD;  Location: [facility];  Service: Urology;  Laterality: Left;   CYSTOSCOPY W/ URETERAL STENT PLACEMENT Bilateral 01/18/2022   Procedure: CYSTOSCOPY WITH RETROGRADE PYELOGRAM/ BILATERAL URETERAL STENT PLACEMENT;  Surgeon: Cleon Gustin, MD;  Location: [facility];  Service: Urology;  Laterality: Bilateral;   ESOPHAGOGASTRODUODENOSCOPY  10/07/2012   Dr. Arnoldo Morale: normal, Clotest negative   HERNIA REPAIR  1956   INGUINAL HERNIA REPAIR Right 11/04/2013   Procedure: RECURRENT RIGHT INGUINAL HERNIA REPAIR;  Surgeon: Jamesetta So, MD;  Location: [facility];  Service: General;  Laterality: Right;   Left shoulder surgery  2011   LESION REMOVAL N/A 11/04/2013   Procedure: EXCISION OF SKIN LESION ABDOMINAL WALL;  Surgeon: Jamesetta So, MD;  Location: [facility];  Service: General;  Laterality: N/A;   TRANSURETHRAL RESECTION OF BLADDER TUMOR N/A 08/17/2021   Procedure: TRANSURETHRAL RESECTION OF BLADDER TUMOR (TURBT);  Surgeon: Cleon Gustin, MD;  Location: [facility];  Service: Urology;  Laterality: N/A;   TRANSURETHRAL RESECTION OF BLADDER TUMOR N/A 01/18/2022   Procedure: TRANSURETHRAL RESECTION OF BLADDER TUMOR (TURBT);  Surgeon: Cleon Gustin, MD;  Location: [facility];  Service: Urology;  Laterality: N/A;   URETEROSCOPY Left 09/07/2021   Procedure: URETEROSCOPY-DIAGNOSTIC;  Surgeon: Cleon Gustin, MD;  Location: [facility];  Service: Urology;  Laterality: Left;    Home Medications:  Allergies as of 03/21/2022       Reactions   Red Dye Nausea And Vomiting   Gadolinium Nausea And Vomiting    Code: VOM, with injection, no other reactions noted, Onset Date: [id number]        Medication List        Accurate as of March 21, 2022 10:29 AM. If you have any questions, ask your nurse or doctor.          ALPRAZolam 0.5 MG tablet Commonly known as: XANAX Take 0.5 mg by mouth at bedtime. For sleep.   aspirin EC 81 MG tablet Take 81 mg by mouth 3 (three) times a week. Swallow whole.   benzonatate 200 MG capsule Commonly known as: TESSALON Take 200 mg by mouth in the morning.   diphenhydrAMINE 2 % cream Commonly known as: BENADRYL Apply 1 Application topically 3 (three) times daily as needed for itching.   doxylamine (Sleep) 25 MG tablet Commonly known as: UNISOM Take 12.5 mg by mouth at bedtime as needed for sleep.   ibuprofen 200 MG tablet Commonly known as: ADVIL Take 200 mg  by mouth every 8 (eight) hours as needed for headache or moderate pain.   multivitamin with minerals Tabs tablet Take 1 tablet by mouth daily. Centrum Silver   naproxen sodium 220 MG tablet Commonly known as: ALEVE Take 220 mg by mouth 2 (two) times daily as needed (pain.).   omeprazole 40 MG capsule Commonly known as: PRILOSEC Take 40 mg by mouth daily with lunch.   oxyCODONE-acetaminophen 5-325 MG tablet Commonly known as: Percocet Take 1 tablet by mouth every 4 (four) hours as needed for severe pain. What changed:  how much to take when to take  this   rOPINIRole 0.5 MG tablet Commonly known as: REQUIP Take 0.5 mg by mouth at bedtime.   silodosin 8 MG Caps capsule Commonly known as: RAPAFLO Take 1 capsule (8 mg total) by mouth daily with breakfast.   SINUS/ALLERGY PE PO Take 1 tablet by mouth daily as needed (allergies).   tamsulosin 0.4 MG Caps capsule Commonly known as: FLOMAX Take 1 capsule (0.4 mg total) by mouth daily. What changed: when to take this        Allergies:  Allergies  Allergen Reactions   Red Dye Nausea And Vomiting   Gadolinium Nausea And Vomiting     Code: VOM, with injection, no other reactions noted, Onset Date: [id number]     Family History: Family History  Problem Relation Age of Onset   Hypertension Mother    Cancer Father    Colon cancer Neg Hx    Diabetes Neg Hx    Thyroid disease Neg Hx     Social History:  reports that he has never smoked. He has never used smokeless tobacco. He reports current alcohol use. He reports that he does not use drugs.  ROS: All other review of systems were reviewed and are negative except what is noted above in HPI  Physical Exam: BP 111/64   Pulse 98   Constitutional:  Alert and oriented, No acute distress. HEENT: [place] AT, moist mucus membranes.  Trachea midline, no masses. Cardiovascular: No clubbing, cyanosis, or edema. Respiratory: Normal respiratory effort, no increased work of breathing. GI: Abdomen is soft, nontender, nondistended, no abdominal masses GU: No CVA tenderness.  Lymph: No cervical or inguinal lymphadenopathy. Skin: No rashes, bruises or suspicious lesions. Neurologic: Grossly intact, no focal deficits, moving all 4 extremities. Psychiatric: Normal mood and affect.  Laboratory Data: Lab Results  Component Value Date   WBC 8.0 08/16/2021   HGB 16.6 08/16/2021   HCT 48.9 08/16/2021   MCV 96.3 08/16/2021   PLT 241 08/16/2021    Lab Results  Component Value Date   CREATININE 1.19 12/15/2021    No results found for:  "PSA"  No results found for: "TESTOSTERONE"  No results found for: "HGBA1C"  Urinalysis    Component Value Date/Time   COLORURINE STRAW (A) 07/07/2021 1047   APPEARANCEUR Clear 12/26/2021 1047   LABSPEC 1.002 (L) 07/07/2021 1047   PHURINE 7.0 07/07/2021 1047   GLUCOSEU Trace (A) 12/26/2021 1047   HGBUR LARGE (A) 07/07/2021 1047   BILIRUBINUR Negative 12/26/2021 1047   KETONESUR NEGATIVE 07/07/2021 1047   PROTEINUR Negative 12/26/2021 1047   PROTEINUR NEGATIVE 07/07/2021 1047   UROBILINOGEN 1.0 02/22/2020 1612   UROBILINOGEN 2.0 (H) 11/04/2009 1425   NITRITE Negative 12/26/2021 1047   NITRITE NEGATIVE 07/07/2021 1047   LEUKOCYTESUR 1+ (A) 12/26/2021 1047   LEUKOCYTESUR TRACE (A) 07/07/2021 1047    Lab Results  Component Value Date   LABMICR See below:  12/26/2021   WBCUA 6-10 (A) 12/26/2021   LABEPIT 0-10 12/26/2021   MUCUS Present 12/19/2021   BACTERIA Few (A) 12/26/2021    Pertinent Imaging: *** Results for orders placed during the hospital encounter of 01/14/04  DG Abd 1 View  Narrative Clinical Data: Bladder calculi. ABDOMEN ONE VIEW CT scan on 03/11/03 raised the question of possible bladder calculi versus sessile bladder mass with calcification. Metallic clips, right upper quadrant secondary to cholecystectomy. Unremarkable bowel gas pattern. No definite urinary tract calcifications. There is small amount of stool in the rectum precluding optimal evaluation for bladder calculi. IMPRESSION Unremarkable plain film of the abdomen. See comments above.  Provider: Briscoe Burns, Orene Desanctis Ischen  No results found for this or any previous visit.  No results found for this or any previous visit.  No results found for this or any previous visit.  Results for orders placed in visit on 11/14/21  US RENAL  Narrative CLINICAL DATA:  Hydronephrosis  EXAM: RENAL / URINARY TRACT ULTRASOUND COMPLETE  COMPARISON:  CT renal 07/07/2021  FINDINGS: Right  Kidney:  Renal measurements: 13.5 x 7.5 x 7.2 cm = volume: 384 mL. Echogenicity within normal limits. No mass. Moderate hydronephrosis visualized.  Left Kidney:  Renal measurements: 12.6 x 6.9 x 5.9 cm = volume: 268 mL. Echogenicity within normal limits. Severe hydronephrosis visualized.  Urinary bladder:  Urinary bladder diverticula. Otherwise appears normal for degree of bladder distention.  Other:  None.  IMPRESSION: 1. Severe left hydronephrosis and moderate right hydronephrosis. No nephrolithiasis identified. Unclear etiology with nonvisualization of the ureters. 2. Urinary bladder diverticula.   Electronically Signed By: Iven Finn M.D. On: 11/20/2021 23:56  No results found for this or any previous visit.  No results found for this or any previous visit.  Results for orders placed during the hospital encounter of 07/07/21  CT Renal Stone Study  Narrative CLINICAL DATA:  Hematuria and urinary frequency. History of bladder cancer.  EXAM: CT ABDOMEN AND PELVIS WITHOUT CONTRAST  TECHNIQUE: Multidetector CT imaging of the abdomen and pelvis was performed following the standard protocol without IV contrast.  COMPARISON:  CT abdomen pelvis 09/13/2016  FINDINGS: Lower chest: No acute abnormality.  Hepatobiliary: Calcified granuloma in the right lobe. No focal hepatic lesion. Status post cholecystectomy. No biliary dilatation.  Pancreas: Diffuse severe parenchymal atrophy and fatty infiltration. The main pancreatic duct is not dilated. No peripancreatic fat stranding or fluid collection.  Spleen: Normal size.  Multiple calcified granulomas.  Adrenals/Urinary Tract: Right adrenal gland is unremarkable. Resolution of the fluid density at the left adrenal gland with interval development of calcifications, consistent with postsurgical changes. No hydronephrosis. Left prominent extrarenal pelvis with mild dilatation of the left ureter. A 0.2 cm  calcified stone is noted in the posterior left aspect of the urinary bladder, just inferior to the left UVJ (series 2, image 67; series 5, image 69). Within the urinary bladder at the right UVJ, there is a 0.3 cm calcified stone (series 2, image 69; series 5, image 65). No perinephric fat stranding or fluid collection bilaterally.  Stomach/Bowel: Patulous distal esophagus. Stomach is within normal limits. Appendix appears normal. No evidence of bowel wall thickening, distention, or inflammatory changes.  Vascular/Lymphatic: Mild scattered calcific atherosclerosis of the abdominal aorta. No abdominal aortic aneurysm. No enlarged lymph nodes in the abdomen or pelvis.  Reproductive: Prostatomegaly with mass effect on the posteroinferior aspect of the urinary bladder.  Other: No abdominal wall hernia or abnormality. No abdominopelvic ascites.  Musculoskeletal: No  acute or significant osseous findings.  IMPRESSION: 1. A 0.3 cm stone is noted within the bladder at the right UVJ. An additional 0.2 cm stone is noted within the bladder, just inferior to the left UVJ. Findings may represent recently passed stones. No additional renal stones or hydronephrosis. 2. No findings of metastatic disease in the abdomen or pelvis.   Electronically Signed By: Ileana Roup M.D. On: 07/07/2021 12:24   Assessment & Plan:    1. Malignant neoplasm of overlapping sites of bladder (HCC) *** - Urinalysis, Routine w reflex microscopic  2. BPH with obstruction/lower urinary tract symptoms ***  3. Hydronephrosis, unspecified hydronephrosis type *** - Ultrasound renal complete; Future - Basic metabolic panel; Future   Return in about 4 weeks (around 04/18/2022) for PVR and renal US.  [name], MD  [facility] [place]

## 2022-03-23 ENCOUNTER — Ambulatory Visit
Admission: EM | Admit: 2022-03-23 | Discharge: 2022-03-23 | Disposition: A | Discharge status: Home / Self Care | Payer: Medicare Other | Attending: Nurse Practitioner | Admitting: Nurse Practitioner

## 2022-03-23 DIAGNOSIS — N3000 Acute cystitis without hematuria: Secondary | ICD-10-CM | POA: Insufficient documentation

## 2022-03-23 LAB — POCT URINALYSIS DIP (MANUAL ENTRY)
Bilirubin, UA: NEGATIVE
Glucose, UA: NEGATIVE mg/dL
Ketones, POC UA: NEGATIVE mg/dL
Nitrite, UA: NEGATIVE
Protein Ur, POC: 100 mg/dL — AB
Spec Grav, UA: 1.02 (ref 1.010–1.025)
Urobilinogen, UA: 0.2 E.U./dL
pH, UA: 6.5 (ref 5.0–8.0)

## 2022-03-23 MED ORDER — AMOXICILLIN-POT CLAVULANATE 875-125 MG PO TABS: 1.0000 | ORAL_TABLET | Freq: Two times a day (BID) | ORAL | 0 refills | Status: DC

## 2022-03-24 ENCOUNTER — Other Ambulatory Visit: Payer: Self-pay | Admitting: Internal Medicine

## 2022-03-26 ENCOUNTER — Telehealth: Payer: Self-pay

## 2022-03-26 ENCOUNTER — Telehealth (HOSPITAL_COMMUNITY): Payer: Self-pay | Admitting: Emergency Medicine

## 2022-03-26 MED ORDER — FOSFOMYCIN TROMETHAMINE 3 G PO PACK: 3.0000 g | PACK | Freq: Once | ORAL | 0 refills | Status: AC

## 2022-03-26 NOTE — Telephone Encounter (Signed)
Patient called back and complains of no appetite, he is having to force himself to eat.  He is asking if there is an rx that can be sent in?  Please advise.

## 2022-03-26 NOTE — Telephone Encounter (Signed)
FYI patient states he started not feeling well on Friday, chills and throwing up.  Went to urgent care and was told he has a UTI.  Patient was given Augmentin.  Patient started taking rx Friday night, waiting for the culture results from urgent care.  Has not seen any improvement in his symptoms yet.  Patient also requesting a refill of his oxycodone and has some concerns he would like to discuss with you at his upcoming apt.

## 2022-03-27 ENCOUNTER — Encounter (HOSPITAL_COMMUNITY): Payer: Self-pay | Admitting: Internal Medicine

## 2022-03-27 LAB — URINE CULTURE: Culture: >=100000 — AB

## 2022-04-16 ENCOUNTER — Ambulatory Visit (HOSPITAL_COMMUNITY)
Admission: RE | Admit: 2022-04-16 | Discharge: 2022-04-16 | Disposition: A | Discharge status: Home / Self Care | Payer: Medicare Other | Source: Ambulatory Visit | Attending: Urology | Admitting: Urology

## 2022-04-16 DIAGNOSIS — N1330 Unspecified hydronephrosis: Secondary | ICD-10-CM | POA: Diagnosis not present

## 2022-04-18 ENCOUNTER — Ambulatory Visit (INDEPENDENT_AMBULATORY_CARE_PROVIDER_SITE_OTHER): Payer: Medicare Other | Admitting: Urology

## 2022-04-18 VITALS — BP 133/74 | HR 96

## 2022-04-18 DIAGNOSIS — R339 Retention of urine, unspecified: Secondary | ICD-10-CM

## 2022-04-18 DIAGNOSIS — N401 Enlarged prostate with lower urinary tract symptoms: Secondary | ICD-10-CM | POA: Diagnosis not present

## 2022-04-18 DIAGNOSIS — N1330 Unspecified hydronephrosis: Secondary | ICD-10-CM

## 2022-04-18 DIAGNOSIS — R8281 Pyuria: Secondary | ICD-10-CM

## 2022-04-18 DIAGNOSIS — N138 Other obstructive and reflux uropathy: Secondary | ICD-10-CM

## 2022-04-18 DIAGNOSIS — C678 Malignant neoplasm of overlapping sites of bladder: Secondary | ICD-10-CM | POA: Diagnosis not present

## 2022-04-18 LAB — MICROSCOPIC EXAMINATION
Bacteria, UA: NONE SEEN
Epithelial Cells (non renal): NONE SEEN /hpf (ref 0–10)
RBC, Urine: >30 /hpf — AB (ref 0–2)
Renal Epithel, UA: NONE SEEN /hpf
WBC, UA: >30 /hpf — AB (ref 0–5)

## 2022-04-18 LAB — URINALYSIS, ROUTINE W REFLEX MICROSCOPIC
Bilirubin, UA: NEGATIVE
Glucose, UA: NEGATIVE
Ketones, UA: NEGATIVE
Nitrite, UA: NEGATIVE
Specific Gravity, UA: 1.025 (ref 1.005–1.030)
Urobilinogen, Ur: 0.2 mg/dL (ref 0.2–1.0)
pH, UA: 6 (ref 5.0–7.5)

## 2022-04-18 LAB — BLADDER SCAN AMB NON-IMAGING: Scan Result: 107

## 2022-04-18 MED ORDER — SILODOSIN 8 MG PO CAPS: 8.0000 mg | ORAL_CAPSULE | Freq: Two times a day (BID) | ORAL | 11 refills | Status: DC

## 2022-04-18 NOTE — Progress Notes (Signed)
04/18/2022 11:01 AM   Chad Butler 26-Jun-1951 [id number]  Referring provider: Redmond School, MD [address] [place],  [place] [address]  Followup BPH and hydronephrosis   HPI: Chad Butler is a 71yo here for followup for BPH and hydronephrosis. IPSS 8 QOl 1 on rapaflo '8mg'$ . Urine strema strong. Nocturia 0-1x. Renal US 7/17 shows stable hydronephrosis. He denies flank pain.    PMH: Past Medical History:  Diagnosis Date   Anxiety    Bladder cancer ([place])    Cancer ([place])    Depression    Hemorrhoids    Hypertension     Surgical History: Past Surgical History:  Procedure Laterality Date   ADRENALECTOMY     [facility]   CHOLECYSTECTOMY  2001   COLONOSCOPY  11/09/2011   Dr. Oneida Alar: two tubular adenomas, mild diverticulosis, internal hemorrhoids, surveillance in 10 years    COLONOSCOPY WITH PROPOFOL N/A 03/20/2022   Procedure: COLONOSCOPY WITH PROPOFOL;  Surgeon: Eloise Harman, DO;  [facility];  Service: Endoscopy;  Laterality: N/A;  9:30 / ASA 2   CYST EXCISION  1970's   back   CYSTOSCOPY W/ RETROGRADES Bilateral 08/17/2021   Procedure: CYSTOSCOPY WITH RETROGRADE PYELOGRAM;  Surgeon: Cleon Gustin, MD;  Location: [facility];  Service: Urology;  Laterality: Bilateral;   CYSTOSCOPY W/ RETROGRADES Left 09/07/2021   Procedure: CYSTOSCOPY WITH RETROGRADE PYELOGRAM;  Surgeon: Cleon Gustin, MD;  Location: [facility];  Service: Urology;  Laterality: Left;   CYSTOSCOPY W/ URETERAL STENT PLACEMENT Bilateral 01/18/2022   Procedure: CYSTOSCOPY WITH RETROGRADE PYELOGRAM/ BILATERAL URETERAL STENT PLACEMENT;  Surgeon: Cleon Gustin, MD;  Location: [facility];  Service: Urology;  Laterality: Bilateral;   ESOPHAGOGASTRODUODENOSCOPY  10/07/2012   Dr. Arnoldo Morale: normal, Clotest negative   HERNIA REPAIR  1956   INGUINAL HERNIA REPAIR Right 11/04/2013   Procedure: RECURRENT RIGHT INGUINAL HERNIA REPAIR;  Surgeon: Jamesetta So, MD;  Location: [facility];  Service: General;   Laterality: Right;   Left shoulder surgery  2011   LESION REMOVAL N/A 11/04/2013   Procedure: EXCISION OF SKIN LESION ABDOMINAL WALL;  Surgeon: Jamesetta So, MD;  Location: [facility];  Service: General;  Laterality: N/A;   POLYPECTOMY  03/20/2022   Procedure: POLYPECTOMY;  Surgeon: Eloise Harman, DO;  [facility];  Service: Endoscopy;;   TRANSURETHRAL RESECTION OF BLADDER TUMOR N/A 08/17/2021   Procedure: TRANSURETHRAL RESECTION OF BLADDER TUMOR (TURBT);  Surgeon: Cleon Gustin, MD;  Location: [facility];  Service: Urology;  Laterality: N/A;   TRANSURETHRAL RESECTION OF BLADDER TUMOR N/A 01/18/2022   Procedure: TRANSURETHRAL RESECTION OF BLADDER TUMOR (TURBT);  Surgeon: Cleon Gustin, MD;  Location: [facility];  Service: Urology;  Laterality: N/A;   URETEROSCOPY Left 09/07/2021   Procedure: URETEROSCOPY-DIAGNOSTIC;  Surgeon: Cleon Gustin, MD;  Location: [facility];  Service: Urology;  Laterality: Left;    Home Medications:  Allergies as of 04/18/2022       Reactions   Red Dye Nausea And Vomiting   Gadolinium Nausea And Vomiting    Code: VOM, with injection, no other reactions noted, Onset Date: [id number]        Medication List        Accurate as of April 18, 2022 11:01 AM. If you have any questions, ask your nurse or doctor.          ALPRAZolam 0.5 MG tablet Commonly known as: XANAX Take 0.5 mg by mouth at bedtime. For sleep.   amoxicillin-clavulanate  875-125 MG tablet Commonly known as: AUGMENTIN Take 1 tablet by mouth every 12 (twelve) hours.   aspirin EC 81 MG tablet Take 81 mg by mouth 3 (three) times a week. Swallow whole.   benzonatate 200 MG capsule Commonly known as: TESSALON Take 200 mg by mouth in the morning.   diphenhydrAMINE 2 % cream Commonly known as: BENADRYL Apply 1 Application topically 3 (three) times daily as needed for itching.   doxylamine (Sleep) 25 MG tablet Commonly known as: UNISOM Take 12.5 mg by mouth at bedtime as  needed for sleep.   ibuprofen 200 MG tablet Commonly known as: ADVIL Take 200 mg by mouth every 8 (eight) hours as needed for headache or moderate pain.   multivitamin with minerals Tabs tablet Take 1 tablet by mouth daily. Centrum Silver   naproxen sodium 220 MG tablet Commonly known as: ALEVE Take 220 mg by mouth 2 (two) times daily as needed (pain.).   omeprazole 40 MG capsule Commonly known as: PRILOSEC Take 40 mg by mouth daily with lunch.   ondansetron 4 MG tablet Commonly known as: ZOFRAN TAKE 1 TABLET BY MOUTH EVERY 8 HOURS AS NEEDED FOR NAUSEA.   oxyCODONE-acetaminophen 5-325 MG tablet Commonly known as: Percocet Take 1 tablet by mouth every 4 (four) hours as needed for severe pain. What changed:  how much to take when to take this   rOPINIRole 0.5 MG tablet Commonly known as: REQUIP Take 0.5 mg by mouth at bedtime.   silodosin 8 MG Caps capsule Commonly known as: RAPAFLO Take 1 capsule (8 mg total) by mouth in the morning and at bedtime.   SINUS/ALLERGY PE PO Take 1 tablet by mouth daily as needed (allergies).        Allergies:  Allergies  Allergen Reactions   Red Dye Nausea And Vomiting   Gadolinium Nausea And Vomiting     Code: VOM, with injection, no other reactions noted, Onset Date: [id number]     Family History: Family History  Problem Relation Age of Onset   Hypertension Mother    Cancer Father    Colon cancer Neg Hx    Diabetes Neg Hx    Thyroid disease Neg Hx     Social History:  reports that he has never smoked. He has never used smokeless tobacco. He reports current alcohol use. He reports that he does not use drugs.  ROS: All other review of systems were reviewed and are negative except what is noted above in HPI  Physical Exam: BP 133/74   Pulse 96   Constitutional:  Alert and oriented, No acute distress. HEENT: [place] AT, moist mucus membranes.  Trachea midline, no masses. Cardiovascular: No clubbing, cyanosis, or  edema. Respiratory: Normal respiratory effort, no increased work of breathing. GI: Abdomen is soft, nontender, nondistended, no abdominal masses GU: No CVA tenderness.  Lymph: No cervical or inguinal lymphadenopathy. Skin: No rashes, bruises or suspicious lesions. Neurologic: Grossly intact, no focal deficits, moving all 4 extremities. Psychiatric: Normal mood and affect.  Laboratory Data: Lab Results  Component Value Date   WBC 8.0 08/16/2021   HGB 16.6 08/16/2021   HCT 48.9 08/16/2021   MCV 96.3 08/16/2021   PLT 241 08/16/2021    Lab Results  Component Value Date   CREATININE 1.19 12/15/2021    No results found for: "PSA"  No results found for: "TESTOSTERONE"  No results found for: "HGBA1C"  Urinalysis    Component Value Date/Time   COLORURINE STRAW (A) 07/07/2021 1047  APPEARANCEUR Cloudy (A) 03/21/2022 1311   LABSPEC 1.002 (L) 07/07/2021 1047   PHURINE 7.0 07/07/2021 1047   GLUCOSEU Negative 03/21/2022 1311   HGBUR LARGE (A) 07/07/2021 1047   BILIRUBINUR negative 03/23/2022 1659   BILIRUBINUR Negative 03/21/2022 1311   KETONESUR negative 03/23/2022 Finger 07/07/2021 1047   PROTEINUR =100 (A) 03/23/2022 1659   PROTEINUR 2+ (A) 03/21/2022 1311   PROTEINUR NEGATIVE 07/07/2021 1047   UROBILINOGEN 0.2 03/23/2022 1659   UROBILINOGEN 2.0 (H) 11/04/2009 1425   NITRITE Negative 03/23/2022 1659   NITRITE Negative 03/21/2022 1311   NITRITE NEGATIVE 07/07/2021 1047   LEUKOCYTESUR Large (3+) (A) 03/23/2022 1659   LEUKOCYTESUR 3+ (A) 03/21/2022 1311   LEUKOCYTESUR TRACE (A) 07/07/2021 1047    Lab Results  Component Value Date   LABMICR See below: 03/21/2022   WBCUA >30 (A) 03/21/2022   LABEPIT 0-10 03/21/2022   MUCUS Present 03/21/2022   BACTERIA None seen 03/21/2022    Pertinent Imaging: Renal US 04/16/2022: Images reviewed and discussed with the patient Results for orders placed during the hospital encounter of 01/14/04  DG Abd 1  View  Narrative Clinical Data: Bladder calculi. ABDOMEN ONE VIEW CT scan on 03/11/03 raised the question of possible bladder calculi versus sessile bladder mass with calcification. Metallic clips, right upper quadrant secondary to cholecystectomy. Unremarkable bowel gas pattern. No definite urinary tract calcifications. There is small amount of stool in the rectum precluding optimal evaluation for bladder calculi. IMPRESSION Unremarkable plain film of the abdomen. See comments above.  Provider: Briscoe Burns, Orene Desanctis Ischen  No results found for this or any previous visit.  No results found for this or any previous visit.  No results found for this or any previous visit.  Results for orders placed during the hospital encounter of 04/16/22  Ultrasound renal complete  Narrative CLINICAL DATA:  Bilateral hydronephrosis. History of bladder cancer and surgery.  EXAM: RENAL / URINARY TRACT ULTRASOUND COMPLETE  COMPARISON:  CT 12/19/2021.  Ultrasound 11/20/2021.  FINDINGS: Right Kidney:  Renal measurements: 11.7 x 5.7 x 6.1 cm = volume: 210.3 mL. Echogenicity within normal limits. No mass. Persistent prominent right-sided hydronephrosis noted. Right ureter poorly visualized.  Left Kidney:  Renal measurements: 13.9 x 6.3 x 5.0 cm = volume: 231.1 mL. Echogenicity within normal limits. No mass. Persistent prominent left-sided hydronephrosis and hydroureter.  Bladder:  Prominent multifocal bladder wall irregular thickening again noted. Patient voided prior to the study, no bladder distention. Postvoid residual is 74.4 cc. Bilateral ureteral stents appear to be present.  Other:  None.  IMPRESSION: 1. Persistent prominent right-sided hydronephrosis. Right ureter poorly visualized. Persistent prominent left-sided hydronephrosis and left hydroureter. Bilateral ureteral stents appear to be present.  2. Prominent multifocal bladder wall irregular thickening again noted.  Patient voided prior to the study, no bladder distention. Postvoid residual is 74.4 cc.   Electronically Signed By: Marcello Moores  Register M.D. On: 04/17/2022 06:28  No results found for this or any previous visit.  No results found for this or any previous visit.  Results for orders placed during the hospital encounter of 07/07/21  CT Renal Stone Study  Narrative CLINICAL DATA:  Hematuria and urinary frequency. History of bladder cancer.  EXAM: CT ABDOMEN AND PELVIS WITHOUT CONTRAST  TECHNIQUE: Multidetector CT imaging of the abdomen and pelvis was performed following the standard protocol without IV contrast.  COMPARISON:  CT abdomen pelvis 09/13/2016  FINDINGS: Lower chest: No acute abnormality.  Hepatobiliary: Calcified granuloma in the right lobe.  No focal hepatic lesion. Status post cholecystectomy. No biliary dilatation.  Pancreas: Diffuse severe parenchymal atrophy and fatty infiltration. The main pancreatic duct is not dilated. No peripancreatic fat stranding or fluid collection.  Spleen: Normal size.  Multiple calcified granulomas.  Adrenals/Urinary Tract: Right adrenal gland is unremarkable. Resolution of the fluid density at the left adrenal gland with interval development of calcifications, consistent with postsurgical changes. No hydronephrosis. Left prominent extrarenal pelvis with mild dilatation of the left ureter. A 0.2 cm calcified stone is noted in the posterior left aspect of the urinary bladder, just inferior to the left UVJ (series 2, image 67; series 5, image 69). Within the urinary bladder at the right UVJ, there is a 0.3 cm calcified stone (series 2, image 69; series 5, image 65). No perinephric fat stranding or fluid collection bilaterally.  Stomach/Bowel: Patulous distal esophagus. Stomach is within normal limits. Appendix appears normal. No evidence of bowel wall thickening, distention, or inflammatory changes.  Vascular/Lymphatic: Mild  scattered calcific atherosclerosis of the abdominal aorta. No abdominal aortic aneurysm. No enlarged lymph nodes in the abdomen or pelvis.  Reproductive: Prostatomegaly with mass effect on the posteroinferior aspect of the urinary bladder.  Other: No abdominal wall hernia or abnormality. No abdominopelvic ascites.  Musculoskeletal: No acute or significant osseous findings.  IMPRESSION: 1. A 0.3 cm stone is noted within the bladder at the right UVJ. An additional 0.2 cm stone is noted within the bladder, just inferior to the left UVJ. Findings may represent recently passed stones. No additional renal stones or hydronephrosis. 2. No findings of metastatic disease in the abdomen or pelvis.   Electronically Signed By: Ileana Roup M.D. On: 07/07/2021 12:24   Assessment & Plan:    1. Malignant neoplasm of overlapping sites of bladder ([place]) -RTC 3 months for cystoscopy - Urinalysis, Routine w reflex microscopic  2. BPH with obstruction/lower urinary tract symptoms -Continue rapaflo '8mg'$  daily - Urinalysis, Routine w reflex microscopic - BLADDER SCAN AMB NON-IMAGING  3. Hydronephrosis, unspecified hydronephrosis type RTC 3 months for stent removal - Urinalysis, Routine w reflex microscopic - BLADDER SCAN AMB NON-IMAGING   No follow-ups on file.  [name], MD  [facility] [place]

## 2022-04-18 NOTE — Patient Instructions (Signed)

## 2022-04-18 NOTE — Progress Notes (Signed)
post void residual=107 

## 2022-04-21 LAB — URINE CULTURE

## 2022-04-24 ENCOUNTER — Encounter: Payer: Self-pay | Admitting: Urology

## 2022-04-24 ENCOUNTER — Telehealth: Payer: Self-pay

## 2022-04-24 MED ORDER — NITROFURANTOIN MONOHYD MACRO 100 MG PO CAPS: 100.0000 mg | ORAL_CAPSULE | Freq: Two times a day (BID) | ORAL | 0 refills | Status: DC

## 2022-04-24 NOTE — Telephone Encounter (Signed)
Verbal from Dr. Alyson Ingles to send in [facility] for pt.  Informed patient and sent to pharmacy.  Patient states the red dye allergy is only a concern when having an MRI he thinks he will tolerate the rx.

## 2022-04-24 NOTE — Telephone Encounter (Signed)
Contradiction to red dye flagging Macrobid.  Nausea and vomiting.  Ok to still send in rx?  Please advise.

## 2022-04-24 NOTE — Telephone Encounter (Signed)
-----   Message from Cleon Gustin, MD sent at 04/24/2022 10:49 AM EDT ----- Macrobid '100mg'$  BID for 7 days please ----- Message ----- From: Audie Box, CMA Sent: 04/23/2022   8:27 AM EDT To: Cleon Gustin, MD  Please review, no current antibiotic

## 2022-05-01 DIAGNOSIS — C7A8 Other malignant neuroendocrine tumors: Secondary | ICD-10-CM | POA: Diagnosis not present

## 2022-05-01 DIAGNOSIS — E782 Mixed hyperlipidemia: Secondary | ICD-10-CM | POA: Diagnosis not present

## 2022-05-01 DIAGNOSIS — Z6822 Body mass index (BMI) 22.0-22.9, adult: Secondary | ICD-10-CM | POA: Diagnosis not present

## 2022-05-01 DIAGNOSIS — C679 Malignant neoplasm of bladder, unspecified: Secondary | ICD-10-CM | POA: Diagnosis not present

## 2022-05-01 DIAGNOSIS — F419 Anxiety disorder, unspecified: Secondary | ICD-10-CM | POA: Diagnosis not present

## 2022-05-01 DIAGNOSIS — R5383 Other fatigue: Secondary | ICD-10-CM | POA: Diagnosis not present

## 2022-05-01 DIAGNOSIS — R7309 Other abnormal glucose: Secondary | ICD-10-CM | POA: Diagnosis not present

## 2022-05-01 DIAGNOSIS — Z0001 Encounter for general adult medical examination with abnormal findings: Secondary | ICD-10-CM | POA: Diagnosis not present

## 2022-05-01 DIAGNOSIS — Z1331 Encounter for screening for depression: Secondary | ICD-10-CM | POA: Diagnosis not present

## 2022-05-01 DIAGNOSIS — G2581 Restless legs syndrome: Secondary | ICD-10-CM | POA: Diagnosis not present

## 2022-05-01 DIAGNOSIS — C678 Malignant neoplasm of overlapping sites of bladder: Secondary | ICD-10-CM | POA: Diagnosis not present

## 2022-05-01 DIAGNOSIS — N400 Enlarged prostate without lower urinary tract symptoms: Secondary | ICD-10-CM | POA: Diagnosis not present

## 2022-05-01 DIAGNOSIS — Q8501 Neurofibromatosis, type 1: Secondary | ICD-10-CM | POA: Diagnosis not present

## 2022-05-22 ENCOUNTER — Ambulatory Visit (INDEPENDENT_AMBULATORY_CARE_PROVIDER_SITE_OTHER): Payer: Medicare Other | Admitting: Physician Assistant

## 2022-05-22 VITALS — BP 153/78 | HR 86

## 2022-05-22 DIAGNOSIS — R350 Frequency of micturition: Secondary | ICD-10-CM

## 2022-05-22 DIAGNOSIS — N3001 Acute cystitis with hematuria: Secondary | ICD-10-CM | POA: Diagnosis not present

## 2022-05-22 LAB — URINALYSIS, ROUTINE W REFLEX MICROSCOPIC
Bilirubin, UA: NEGATIVE
Glucose, UA: NEGATIVE
Nitrite, UA: NEGATIVE
Specific Gravity, UA: 1.025 (ref 1.005–1.030)
Urobilinogen, Ur: 0.2 mg/dL (ref 0.2–1.0)
pH, UA: 6 (ref 5.0–7.5)

## 2022-05-22 LAB — MICROSCOPIC EXAMINATION
RBC, Urine: >30 /hpf — AB (ref 0–2)
Renal Epithel, UA: NONE SEEN /hpf

## 2022-05-22 LAB — BLADDER SCAN AMB NON-IMAGING: Scan Result: 53

## 2022-05-22 MED ORDER — NITROFURANTOIN MONOHYD MACRO 100 MG PO CAPS: 100.0000 mg | ORAL_CAPSULE | Freq: Two times a day (BID) | ORAL | 0 refills | Status: DC

## 2022-05-22 NOTE — Progress Notes (Signed)
Assessment: 1. Frequent urination - BLADDER SCAN AMB NON-IMAGING - Urinalysis, Routine w reflex microscopic - Urine Culture  2. Acute cystitis with hematuria - Urine Culture    Plan: Macrobid sent to pharmacy and cx ordered. Will adjust tx if indicated by results. Keep FU as scheduled  Chief Complaint: No chief complaint on file.   HPI: Chad Butler is a 71 y.o. male with h/o bladder CA, BPH and hydronephrosis who presents for acute visit for evaluation of burning and worsening of urinary frequency, urgency, intermittency, weakness of stream over the past week.  Patient was treated for UTI last month with recent culture history below. He denies fever, chills, NV, flank pain, gross hematuria Urine culture on 04/18/2022 grew Staphylococcus hemolyticus, 25-50,000 colonies, resistant to Cipro oxacillin penicillin, and trimethoprim sulfa.  Intermediate resistance to moxifloxacin and Levaquin. 03/23/2022= greater than 100,000 colonies of Staphylococcus hemolyticus and 20,000 colonies of diphtheroids, similar resistance pattern.   UA= 6-10 WBCs, greater than 30 RBCs, few bacteria, nitrite negative PVR=47m IPSS= 25          QOL= 5  04/18/22 Mr DRaleighis a 89yohere for followup for BPH and hydronephrosis. IPSS 8 QOl 1 on rapaflo '8mg'$ . Urine strema strong. Nocturia 0-1x. Renal UKorea7/17 shows stable hydronephrosis. He denies flank pain.   03/21/22 Mr DMizeris a 89yohere for followup for BPh with incomplete emptying. He underwent UDS which showed a low bladder capacity, positive detrusor instability, flow 8cc/sec, max detrusor pressure 30cm of water, PVR 250cc. Fluoroscopy was not avaailble during the exam. He has a weak urinary stream and occasional straining to urinate.   Portions of the above documentation were copied from a prior visit for review purposes only.  Allergies: Allergies  Allergen Reactions   Red Dye Nausea And Vomiting   Gadolinium Nausea And Vomiting     Code:  VOM, with injection, no other reactions noted, Onset Date: [id number]    PMH: Past Medical History:  Diagnosis Date   An[place]    [place]    [place]    Depression    Hemorrhoids    Hypertension     PSH: Past Surgical History:  Pro[facility] Laterality Date   [facility]  [facility]  2001   COLONOSCOPY  11/09/2011   Dr. FOneida Alar two tubular adenomas, mild diverticulosis, internal hemorrhoids, surveillance in 10 years    COLONOSCOPY WITH PROPOFOL N/A 03/20/2022   Procedure: COLONOSCOPY WITH PROPOFOL;  Surgeon: CEloise Harman DO;  [facility];  Service: Endoscopy;  Laterality: N/A;  9:30 / ASA 2   CYST EXCISION  1970's   back   CYSTOSCOPY W/ RETROGRADES Bilateral 08/17/2021   Procedure: CYSTOSCOPY WITH RETROGRADE PYELOGRAM;  Surgeon: MCleon Gustin MD;  Location: [facility];  Service: Urology;  Laterality: Bilateral;   CYSTOSCOPY W/ RETROGRADES Left 09/07/2021   Procedure: CYSTOSCOPY WITH RETROGRADE PYELOGRAM;  Surgeon: MCleon Gustin MD;  Location: [facility];  Service: Urology;  Laterality: Left;   CYSTOSCOPY W/ URETERAL STENT PLACEMENT Bilateral 01/18/2022   Procedure: CYSTOSCOPY WITH RETROGRADE PYELOGRAM/ BILATERAL URETERAL STENT PLACEMENT;  Surgeon: MCleon Gustin MD;  Location: [facility];  Service: Urology;  Laterality: Bilateral;   ESOPHAGOGASTRODUODENOSCOPY  10/07/2012   Dr. JArnoldo Morale normal, Clotest negative   HERNIA REPAIR  1956   INGUINAL HERNIA REPAIR Right 11/04/2013   Procedure: RECURRENT RIGHT INGUINAL HERNIA REPAIR;  Surgeon: MJamesetta So MD;  Location: [facility];  Service:  General;  Laterality: Right;   Left shoulder surgery  2011   LESION REMOVAL N/A 11/04/2013   Procedure: EXCISION OF SKIN LESION ABDOMINAL WALL;  Surgeon: Jamesetta So, MD;  Location: [facility];  Service: General;  Laterality: N/A;   POLYPECTOMY  03/20/2022   Procedure: POLYPECTOMY;  Surgeon: Eloise Harman, DO;  [facility];   Service: Endoscopy;;   TRANSURETHRAL RESECTION OF BLADDER TUMOR N/A 08/17/2021   Procedure: TRANSURETHRAL RESECTION OF BLADDER TUMOR (TURBT);  Surgeon: Cleon Gustin, MD;  Location: [facility];  Service: Urology;  Laterality: N/A;   TRANSURETHRAL RESECTION OF BLADDER TUMOR N/A 01/18/2022   Procedure: TRANSURETHRAL RESECTION OF BLADDER TUMOR (TURBT);  Surgeon: Cleon Gustin, MD;  Location: [facility];  Service: Urology;  Laterality: N/A;   URETEROSCOPY Left 09/07/2021   Procedure: URETEROSCOPY-DIAGNOSTIC;  Surgeon: Cleon Gustin, MD;  Location: [facility];  Service: Urology;  Laterality: Left;    SH: Social History   Tobacco Use   Smoking status: Never   Smokeless tobacco: Never  Vaping Use   Vaping Use: Never used  Substance Use Topics   Alcohol use: Yes    Comment: 2 oz wine some days   Drug use: No    ROS: All other review of systems were reviewed and are negative except what is noted above in HPI  PE: BP (!) 153/78   Pulse 86  GENERAL APPEARANCE:  Well appearing, well developed, well nourished, NAD HEENT:  Atraumatic, normocephalic NECK:  Supple. Trachea midline ABDOMEN:  Soft, non-tender, no masses EXTREMITIES:  Moves all extremities well, without clubbing, cyanosis, or edema NEUROLOGIC:  Alert and oriented x 3, normal gait, CN II-XII grossly intact MENTAL STATUS:  appropriate BACK:  Non-tender to palpation, No CVAT SKIN:  Warm, dry, and intact   Results: Laboratory Data: Lab Results  Component Value Date   WBC 8.0 08/16/2021   HGB 16.6 08/16/2021   HCT 48.9 08/16/2021   MCV 96.3 08/16/2021   PLT 241 08/16/2021    Lab Results  Component Value Date   CREATININE 1.19 12/15/2021    Urinalysis    Component Value Date/Time   COLORURINE STRAW (A) 07/07/2021 1047   APPEARANCEUR Cloudy (A) 04/18/2022 1058   LABSPEC 1.002 (L) 07/07/2021 1047   PHURINE 7.0 07/07/2021 1047   GLUCOSEU Negative 04/18/2022 1058   HGBUR LARGE (A) 07/07/2021 1047   BILIRUBINUR  Negative 04/18/2022 1058   KETONESUR negative 03/23/2022 1659   KETONESUR NEGATIVE 07/07/2021 1047   PROTEINUR 3+ (A) 04/18/2022 1058   PROTEINUR NEGATIVE 07/07/2021 1047   UROBILINOGEN 0.2 03/23/2022 1659   UROBILINOGEN 2.0 (H) 11/04/2009 1425   NITRITE Negative 04/18/2022 1058   NITRITE NEGATIVE 07/07/2021 1047   LEUKOCYTESUR 3+ (A) 04/18/2022 1058   LEUKOCYTESUR TRACE (A) 07/07/2021 1047    Lab Results  Component Value Date   LABMICR See below: 04/18/2022   WBCUA >30 (A) 04/18/2022   LABEPIT None seen 04/18/2022   MUCUS Present 04/18/2022   BACTERIA None seen 04/18/2022    Pertinent Imaging: Results for orders placed during the hospital encounter of 01/14/04  DG Abd 1 View  Narrative Clinical Data: Bladder calculi. ABDOMEN ONE VIEW CT scan on 03/11/03 raised the question of possible bladder calculi versus sessile bladder mass with calcification. Metallic clips, right upper quadrant secondary to cholecystectomy. Unremarkable bowel gas pattern. No definite urinary tract calcifications. There is small amount of stool in the rectum precluding optimal evaluation for bladder calculi. IMPRESSION Unremarkable plain film of  the abdomen. See comments above.  Provider: Briscoe Burns, Orene Desanctis Ischen  No results found for this or any previous visit.  No results found for this or any previous visit.  No results found for this or any previous visit.  Results for orders placed during the hospital encounter of 04/16/22  Ultrasound renal complete  Narrative CLINICAL DATA:  Bilateral hydronephrosis. History of bladder cancer and surgery.  EXAM: RENAL / URINARY TRACT ULTRASOUND COMPLETE  COMPARISON:  CT 12/19/2021.  Ultrasound 11/20/2021.  FINDINGS: Right Kidney:  Renal measurements: 11.7 x 5.7 x 6.1 cm = volume: 210.3 mL. Echogenicity within normal limits. No mass. Persistent prominent right-sided hydronephrosis noted. Right ureter poorly visualized.  Left Kidney:  Renal  measurements: 13.9 x 6.3 x 5.0 cm = volume: 231.1 mL. Echogenicity within normal limits. No mass. Persistent prominent left-sided hydronephrosis and hydroureter.  Bladder:  Prominent multifocal bladder wall irregular thickening again noted. Patient voided prior to the study, no bladder distention. Postvoid residual is 74.4 cc. Bilateral ureteral stents appear to be present.  Other:  None.  IMPRESSION: 1. Persistent prominent right-sided hydronephrosis. Right ureter poorly visualized. Persistent prominent left-sided hydronephrosis and left hydroureter. Bilateral ureteral stents appear to be present.  2. Prominent multifocal bladder wall irregular thickening again noted. Patient voided prior to the study, no bladder distention. Postvoid residual is 74.4 cc.   Electronically Signed By: Marcello Moores  Register M.D. On: 04/17/2022 06:28  No results found for this or any previous visit.  No results found for this or any previous visit.  Results for orders placed during the hospital encounter of 07/07/21  CT Renal Stone Study  Narrative CLINICAL DATA:  Hematuria and urinary frequency. History of bladder cancer.  EXAM: CT ABDOMEN AND PELVIS WITHOUT CONTRAST  TECHNIQUE: Multidetector CT imaging of the abdomen and pelvis was performed following the standard protocol without IV contrast.  COMPARISON:  CT abdomen pelvis 09/13/2016  FINDINGS: Lower chest: No acute abnormality.  Hepatobiliary: Calcified granuloma in the right lobe. No focal hepatic lesion. Status post cholecystectomy. No biliary dilatation.  Pancreas: Diffuse severe parenchymal atrophy and fatty infiltration. The main pancreatic duct is not dilated. No peripancreatic fat stranding or fluid collection.  Spleen: Normal size.  Multiple calcified granulomas.  Adrenals/Urinary Tract: Right adrenal gland is unremarkable. Resolution of the fluid density at the left adrenal gland with interval development of  calcifications, consistent with postsurgical changes. No hydronephrosis. Left prominent extrarenal pelvis with mild dilatation of the left ureter. A 0.2 cm calcified stone is noted in the posterior left aspect of the urinary bladder, just inferior to the left UVJ (series 2, image 67; series 5, image 69). Within the urinary bladder at the right UVJ, there is a 0.3 cm calcified stone (series 2, image 69; series 5, image 65). No perinephric fat stranding or fluid collection bilaterally.  Stomach/Bowel: Patulous distal esophagus. Stomach is within normal limits. Appendix appears normal. No evidence of bowel wall thickening, distention, or inflammatory changes.  Vascular/Lymphatic: Mild scattered calcific atherosclerosis of the abdominal aorta. No abdominal aortic aneurysm. No enlarged lymph nodes in the abdomen or pelvis.  Reproductive: Prostatomegaly with mass effect on the posteroinferior aspect of the urinary bladder.  Other: No abdominal wall hernia or abnormality. No abdominopelvic ascites.  Musculoskeletal: No acute or significant osseous findings.  IMPRESSION: 1. A 0.3 cm stone is noted within the bladder at the right UVJ. An additional 0.2 cm stone is noted within the bladder, just inferior to the left UVJ. Findings may represent recently passed  stones. No additional renal stones or hydronephrosis. 2. No findings of metastatic disease in the abdomen or pelvis.   Electronically Signed By: Ileana Roup M.D. On: 07/07/2021 12:24  No results found for this or any previous visit (from the past 24 hour(s)).

## 2022-05-24 LAB — URINE CULTURE: Organism ID, Bacteria: NO GROWTH

## 2022-05-31 ENCOUNTER — Telehealth: Payer: Self-pay | Admitting: *Deleted

## 2022-05-31 NOTE — Patient Outreach (Signed)
  Care Coordination   05/31/2022 Name: Chad Butler MRN: [id number] DOB: 06/15/1951   Care Coordination Outreach Attempts:  An unsuccessful telephone outreach was attempted today to offer the patient information about available care coordination services as a benefit of their health plan.   Follow Up Plan:  Additional outreach attempts will be made to offer the patient care coordination information and services.   Encounter Outcome:  No Answer  Care Coordination Interventions Activated:  No   Care Coordination Interventions:  No, not indicated    Chong Sicilian, BSN, RN-BC [place] / [facility], community Dial: [phone]

## 2022-06-07 ENCOUNTER — Telehealth: Payer: Self-pay

## 2022-06-07 ENCOUNTER — Ambulatory Visit (INDEPENDENT_AMBULATORY_CARE_PROVIDER_SITE_OTHER): Payer: Medicare Other | Admitting: Physician Assistant

## 2022-06-07 VITALS — BP 144/78 | HR 81

## 2022-06-07 DIAGNOSIS — R310 Gross hematuria: Secondary | ICD-10-CM

## 2022-06-07 DIAGNOSIS — Z8551 Personal history of malignant neoplasm of bladder: Secondary | ICD-10-CM | POA: Diagnosis not present

## 2022-06-07 DIAGNOSIS — C678 Malignant neoplasm of overlapping sites of bladder: Secondary | ICD-10-CM

## 2022-06-07 DIAGNOSIS — R350 Frequency of micturition: Secondary | ICD-10-CM

## 2022-06-07 DIAGNOSIS — Z960 Presence of urogenital implants: Secondary | ICD-10-CM | POA: Diagnosis not present

## 2022-06-07 DIAGNOSIS — R8281 Pyuria: Secondary | ICD-10-CM

## 2022-06-07 MED ORDER — PHENAZOPYRIDINE HCL 100 MG PO TABS: 100.0000 mg | ORAL_TABLET | Freq: Three times a day (TID) | ORAL | 0 refills | Status: AC | PRN

## 2022-06-07 NOTE — Telephone Encounter (Signed)
Patient called in with concern of gross hematuria, UTI, and OAB. Made patient aware that he can come in today to see PA regarding his concerns. Patient voiced understanding.

## 2022-06-07 NOTE — Progress Notes (Signed)
Assessment: 1. Pyuria - Urine Culture  2. Malignant neoplasm of overlapping sites of bladder ([place])  3. Gross hematuria  4. Ureteral stent present    Plan: Urine cx and pyridium for occasional burning. Will start [place]. Continue silodosin. He will keep current appt scheduled for FU and stent removal.  Chief Complaint: No chief complaint on file.   HPI: Chad Butler is a 71 y.o. Butler who presents for evaluation of acute onset urinary frequency with passing some intermittent blood and clots. He continues to have burning on occasion. No fever, chills, abdominal pain. No difficulty passing urine. Pt scheduled for stent removal on 07/18/22.  UA= 6-10 WBC, moderate bacteria, nitrite negative  Culture on 05/22/22-no growth Previous cxs noted below  05/22/22 Chad Butler is a 71 y.o. Butler with h/o bladder CA, BPH and hydronephrosis who presents for acute visit for evaluation of burning and worsening of urinary frequency, urgency, intermittency, weakness of stream over the past week.  Patient was treated for UTI last month with recent culture history below. He denies fever, chills, NV, flank pain, gross hematuria Urine culture on 04/18/2022 grew Staphylococcus hemolyticus, 25-50,000 colonies, resistant to Cipro oxacillin penicillin, and trimethoprim sulfa.  Intermediate resistance to moxifloxacin and Levaquin. 03/23/2022= greater than 100,000 colonies of Staphylococcus hemolyticus and 20,000 colonies of diphtheroids, similar resistance pattern.   04/18/22 Chad Butler is a 71yo here for followup for BPH and hydronephrosis. IPSS 8 QOl 1 on rapaflo '8mg'$ . Urine strema strong. Nocturia 0-1x. Renal US 7/17 shows stable hydronephrosis. He denies flank pain.   Portions of the above documentation were copied from a prior visit for review purposes only.  Allergies: Allergies  Allergen Reactions   Red Dye Nausea And Vomiting   Gadolinium Nausea And Vomiting     Code: VOM, with injection,  no other reactions noted, Onset Date: [id number]     PMH: Past Medical History:  Diagnosis Date   Anxiety    Bladder cancer ([place])    Cancer ([place])    Depression    Hemorrhoids    Hypertension     PSH: Past Surgical History:  Procedure Laterality Date   ADRENALECTOMY     [facility]   CHOLECYSTECTOMY  2001   COLONOSCOPY  11/09/2011   Dr. Oneida Alar: two tubular adenomas, mild diverticulosis, internal hemorrhoids, surveillance in 10 years    COLONOSCOPY WITH PROPOFOL N/A 03/20/2022   Procedure: COLONOSCOPY WITH PROPOFOL;  Surgeon: Eloise Harman, DO;  [facility];  Service: Endoscopy;  Laterality: N/A;  9:30 / ASA 2   CYST EXCISION  1970's   back   CYSTOSCOPY W/ RETROGRADES Bilateral 08/17/2021   Procedure: CYSTOSCOPY WITH RETROGRADE PYELOGRAM;  Surgeon: Cleon Gustin, MD;  Location: [facility];  Service: Urology;  Laterality: Bilateral;   CYSTOSCOPY W/ RETROGRADES Left 09/07/2021   Procedure: CYSTOSCOPY WITH RETROGRADE PYELOGRAM;  Surgeon: Cleon Gustin, MD;  Location: [facility];  Service: Urology;  Laterality: Left;   CYSTOSCOPY W/ URETERAL STENT PLACEMENT Bilateral 01/18/2022   Procedure: CYSTOSCOPY WITH RETROGRADE PYELOGRAM/ BILATERAL URETERAL STENT PLACEMENT;  Surgeon: Cleon Gustin, MD;  Location: [facility];  Service: Urology;  Laterality: Bilateral;   ESOPHAGOGASTRODUODENOSCOPY  10/07/2012   Dr. Arnoldo Morale: normal, Clotest negative   HERNIA REPAIR  1956   INGUINAL HERNIA REPAIR Right 11/04/2013   Procedure: RECURRENT RIGHT INGUINAL HERNIA REPAIR;  Surgeon: Jamesetta So, MD;  Location: [facility];  Service: General;  Laterality: Right;   Left shoulder surgery  2011  LESION REMOVAL N/A 11/04/2013   Procedure: EXCISION OF SKIN LESION ABDOMINAL WALL;  Surgeon: Jamesetta So, MD;  Location: [facility];  Service: General;  Laterality: N/A;   POLYPECTOMY  03/20/2022   Procedure: POLYPECTOMY;  Surgeon: Eloise Harman, DO;  [facility];  Service: Endoscopy;;    TRANSURETHRAL RESECTION OF BLADDER TUMOR N/A 08/17/2021   Procedure: TRANSURETHRAL RESECTION OF BLADDER TUMOR (TURBT);  Surgeon: Cleon Gustin, MD;  Location: [facility];  Service: Urology;  Laterality: N/A;   TRANSURETHRAL RESECTION OF BLADDER TUMOR N/A 01/18/2022   Procedure: TRANSURETHRAL RESECTION OF BLADDER TUMOR (TURBT);  Surgeon: Cleon Gustin, MD;  Location: [facility];  Service: Urology;  Laterality: N/A;   URETEROSCOPY Left 09/07/2021   Procedure: URETEROSCOPY-DIAGNOSTIC;  Surgeon: Cleon Gustin, MD;  Location: [facility];  Service: Urology;  Laterality: Left;    SH: Social History   Tobacco Use   Smoking status: Never   Smokeless tobacco: Never  Vaping Use   Vaping Use: Never used  Substance Use Topics   Alcohol use: Yes    Comment: 2 oz wine some days   Drug use: No    ROS: All other review of systems were reviewed and are negative except what is noted above in HPI  PE: BP (!) 144/78   Pulse 81  GENERAL APPEARANCE:  Well appearing, well developed, well nourished, NAD HEENT:  Atraumatic, normocephalic NECK:  Supple. Trachea midline ABDOMEN:  Soft, non-tender, no masses EXTREMITIES:  Moves all extremities well, without clubbing, cyanosis, or edema NEUROLOGIC:  Alert and oriented x 3, normal gait, CN II-XII grossly intact MENTAL STATUS:  appropriate BACK:  Non-tender to palpation, No CVAT SKIN:  Warm, dry, and intact   Results: Laboratory Data: Lab Results  Component Value Date   WBC 8.0 08/16/2021   HGB 16.6 08/16/2021   HCT 48.9 08/16/2021   MCV 96.3 08/16/2021   PLT 241 08/16/2021    Lab Results  Component Value Date   CREATININE 1.19 12/15/2021    Urinalysis    Component Value Date/Time   COLORURINE STRAW (A) 07/07/2021 1047   APPEARANCEUR Cloudy (A) 05/22/2022 1038   LABSPEC 1.002 (L) 07/07/2021 1047   PHURINE 7.0 07/07/2021 1047   GLUCOSEU Negative 05/22/2022 1038   HGBUR LARGE (A) 07/07/2021 1047   BILIRUBINUR Negative 05/22/2022  1038   KETONESUR negative 03/23/2022 1659   KETONESUR NEGATIVE 07/07/2021 1047   PROTEINUR 3+ (A) 05/22/2022 1038   PROTEINUR NEGATIVE 07/07/2021 1047   UROBILINOGEN 0.2 03/23/2022 1659   UROBILINOGEN 2.0 (H) 11/04/2009 1425   NITRITE Negative 05/22/2022 1038   NITRITE NEGATIVE 07/07/2021 1047   LEUKOCYTESUR 1+ (A) 05/22/2022 1038   LEUKOCYTESUR TRACE (A) 07/07/2021 1047    Lab Results  Component Value Date   LABMICR See below: 05/22/2022   WBCUA 6-10 (A) 05/22/2022   LABEPIT 0-10 05/22/2022   MUCUS Present 05/22/2022   BACTERIA Few 05/22/2022    Pertinent Imaging: Results for orders placed during the hospital encounter of 01/14/04  DG Abd 1 View  Narrative Clinical Data: Bladder calculi. ABDOMEN ONE VIEW CT scan on 03/11/03 raised the question of possible bladder calculi versus sessile bladder mass with calcification. Metallic clips, right upper quadrant secondary to cholecystectomy. Unremarkable bowel gas pattern. No definite urinary tract calcifications. There is small amount of stool in the rectum precluding optimal evaluation for bladder calculi. IMPRESSION Unremarkable plain film of the abdomen. See comments above.  Provider: Briscoe Burns, Orene Desanctis Ischen  No results found  for this or any previous visit.  No results found for this or any previous visit.  No results found for this or any previous visit.  Results for orders placed during the hospital encounter of 04/16/22  Ultrasound renal complete  Narrative CLINICAL DATA:  Bilateral hydronephrosis. History of bladder cancer and surgery.  EXAM: RENAL / URINARY TRACT ULTRASOUND COMPLETE  COMPARISON:  CT 12/19/2021.  Ultrasound 11/20/2021.  FINDINGS: Right Kidney:  Renal measurements: 11.7 x 5.7 x 6.1 cm = volume: 210.3 mL. Echogenicity within normal limits. No mass. Persistent prominent right-sided hydronephrosis noted. Right ureter poorly visualized.  Left Kidney:  Renal measurements: 13.9 x 6.3 x 5.0  cm = volume: 231.1 mL. Echogenicity within normal limits. No mass. Persistent prominent left-sided hydronephrosis and hydroureter.  Bladder:  Prominent multifocal bladder wall irregular thickening again noted. Patient voided prior to the study, no bladder distention. Postvoid residual is 74.4 cc. Bilateral ureteral stents appear to be present.  Other:  None.  IMPRESSION: 1. Persistent prominent right-sided hydronephrosis. Right ureter poorly visualized. Persistent prominent left-sided hydronephrosis and left hydroureter. Bilateral ureteral stents appear to be present.  2. Prominent multifocal bladder wall irregular thickening again noted. Patient voided prior to the study, no bladder distention. Postvoid residual is 74.4 cc.   Electronically Signed By: Marcello Moores  Register M.D. On: 04/17/2022 06:28  No results found for this or any previous visit.  No results found for this or any previous visit.  Results for orders placed during the hospital encounter of 07/07/21  CT Renal Stone Study  Narrative CLINICAL DATA:  Hematuria and urinary frequency. History of bladder cancer.  EXAM: CT ABDOMEN AND PELVIS WITHOUT CONTRAST  TECHNIQUE: Multidetector CT imaging of the abdomen and pelvis was performed following the standard protocol without IV contrast.  COMPARISON:  CT abdomen pelvis 09/13/2016  FINDINGS: Lower chest: No acute abnormality.  Hepatobiliary: Calcified granuloma in the right lobe. No focal hepatic lesion. Status post cholecystectomy. No biliary dilatation.  Pancreas: Diffuse severe parenchymal atrophy and fatty infiltration. The main pancreatic duct is not dilated. No peripancreatic fat stranding or fluid collection.  Spleen: Normal size.  Multiple calcified granulomas.  Adrenals/Urinary Tract: Right adrenal gland is unremarkable. Resolution of the fluid density at the left adrenal gland with interval development of calcifications, consistent with  postsurgical changes. No hydronephrosis. Left prominent extrarenal pelvis with mild dilatation of the left ureter. A 0.2 cm calcified stone is noted in the posterior left aspect of the urinary bladder, just inferior to the left UVJ (series 2, image 67; series 5, image 69). Within the urinary bladder at the right UVJ, there is a 0.3 cm calcified stone (series 2, image 69; series 5, image 65). No perinephric fat stranding or fluid collection bilaterally.  Stomach/Bowel: Patulous distal esophagus. Stomach is within normal limits. Appendix appears normal. No evidence of bowel wall thickening, distention, or inflammatory changes.  Vascular/Lymphatic: Mild scattered calcific atherosclerosis of the abdominal aorta. No abdominal aortic aneurysm. No enlarged lymph nodes in the abdomen or pelvis.  Reproductive: Prostatomegaly with mass effect on the posteroinferior aspect of the urinary bladder.  Other: No abdominal wall hernia or abnormality. No abdominopelvic ascites.  Musculoskeletal: No acute or significant osseous findings.  IMPRESSION: 1. A 0.3 cm stone is noted within the bladder at the right UVJ. An additional 0.2 cm stone is noted within the bladder, just inferior to the left UVJ. Findings may represent recently passed stones. No additional renal stones or hydronephrosis. 2. No findings of metastatic disease in the  abdomen or pelvis.   Electronically Signed By: Ileana Roup M.D. On: 07/07/2021 12:24  No results found for this or any previous visit (from the past 24 hour(s)).

## 2022-06-08 ENCOUNTER — Telehealth: Payer: Self-pay

## 2022-06-08 MED ORDER — NITROFURANTOIN MONOHYD MACRO 100 MG PO CAPS: 100.0000 mg | ORAL_CAPSULE | Freq: Two times a day (BID) | ORAL | 0 refills | Status: DC

## 2022-06-08 NOTE — Telephone Encounter (Signed)
-----   Message from Reynaldo Minium, [place] sent at 06/08/2022 12:22 PM EDT ----- Please let pt know Dr. Alyson Ingles wants the stent to stay in until his next appt in October. Pt culture is not back so I sent a Rx for Macrobid for him to start for possible infection. He may start that today.

## 2022-06-08 NOTE — Telephone Encounter (Signed)
Made patient aware to keep stent in until next appt and a rx was sent in for possible infection that need to be started today. Patient voiced understanding.

## 2022-06-09 LAB — URINE CULTURE: Organism ID, Bacteria: NO GROWTH

## 2022-06-11 ENCOUNTER — Telehealth: Payer: Self-pay

## 2022-06-11 NOTE — Addendum Note (Signed)
Addended by: Dorisann Frames on: 06/11/2022 01:55 PM   Modules accepted: Orders

## 2022-06-11 NOTE — Telephone Encounter (Signed)
-----   Message from Reynaldo Minium, [place] sent at 06/11/2022  8:42 AM EDT ----- Please let pt know his urine cx is negative. He may stop the Macrobid   ----- Message ----- From: Interface, [facility] Lab Results In Sent: 06/09/2022  10:36 AM EDT To: Reynaldo Minium, PA-C

## 2022-06-11 NOTE — Telephone Encounter (Signed)
Patient returned call to office to clarify which medication he should not be taking.  Reviewed with patient to stop macrobid (nitrofurantoin) patient voiced understanding.

## 2022-06-11 NOTE — Telephone Encounter (Signed)
Made patient aware that his culture was negative and he can stop taking the Macrobid. Patient voiced understanding.

## 2022-07-17 ENCOUNTER — Telehealth: Payer: Self-pay | Admitting: *Deleted

## 2022-07-17 NOTE — Patient Outreach (Signed)
  Care Coordination   Initial Visit Note   07/17/2022 Name: SEAN MALINOWSKI MRN: [id number] DOB: [birth year]  Lauris Chroman is a 71 y.o. year old male who sees Redmond School, MD for primary care. I spoke with  Fenton Malling Pedro by phone today.  What matters to the patients health and wellness today?  Report he is "good" and denies need for care coordination services at this time. Does agree to receive information packet in the mail.    SDOH assessments and interventions completed:  No     Care Coordination Interventions Activated:  No  Care Coordination Interventions:  No, not indicated   Follow up plan: No further intervention required.   Encounter Outcome:  Pt. Refused   Valente Aydon, RN, MSN, [place] Care Management Care Management Coordinator [phone]

## 2022-07-18 ENCOUNTER — Ambulatory Visit (INDEPENDENT_AMBULATORY_CARE_PROVIDER_SITE_OTHER): Payer: Medicare Other | Admitting: Urology

## 2022-07-18 VITALS — BP 131/79 | HR 90

## 2022-07-18 DIAGNOSIS — C678 Malignant neoplasm of overlapping sites of bladder: Secondary | ICD-10-CM | POA: Diagnosis not present

## 2022-07-18 MED ORDER — CIPROFLOXACIN HCL 500 MG PO TABS
500.0000 mg | ORAL_TABLET | Freq: Once | ORAL | Status: AC
  Administered 2022-07-18: 500 mg via ORAL

## 2022-07-18 NOTE — Patient Instructions (Signed)
Dear Mr. Turay,   Thank you for choosing [facility] to assist in your urologic care for your upcoming surgery. The following information below includes specific dates and details related to surgery:   The Surgical Procedure you are scheduled to have performed is cystoscopy left ureteral stent removal   Surgery Date: 07/26/2022   Physician performing the surgery: Dr. [name]   Do not eat or drink after midnight the day before your surgery.    You will need a driver the day of surgery and will not be able to operate heavy machinery for 24 hours after.    Your surgery will be performed at  [facility] [address], [place] [address]   Enter at the Main Entrance and check in at the [place] desk.     Pre-Admit Testing Info   Pre- Admit appointments are interview with an anesthesiologist or a pre-operative anesthesia nurse. These appointments are typically completed as an in person visit but can take place over the telephone.  You will be contacted to confirm the date and time window.    If you have any questions or concerns, please don't hesitate to call the office at [phone]   Thank you,    Gibson Ramp, RN Clinical Surgery Coordinator [facility]

## 2022-07-18 NOTE — Progress Notes (Signed)
I spoke with Chad Butler. We have discussed possible surgery dates and 07/26/2022 was agreed upon by all parties. Patient given information about surgery date, what to expect pre-operatively and post operatively.    We discussed that a pre-op nurse will be calling to set up the pre-op visit that will take place prior to surgery. Informed patient that our office will communicate any additional care to be provided after surgery.    Patients questions or concerns were discussed during our call. Advised to call our office should there be any additional information, questions or concerns that arise. Patient verbalized understanding.

## 2022-07-18 NOTE — Progress Notes (Signed)
   07/18/22  CC: followup stent removal  HPI: Mr Grimaldo is a 71yo here for ureteral stent removal Blood pressure 131/79, pulse 90. NED. A&Ox3.   No respiratory distress   Abd soft, NT, ND Normal phallus with bilateral descended testicles  Cystoscopy Procedure Note  Patient identification was confirmed, informed consent was obtained, and patient was prepped using Betadine solution.  Lidocaine jelly was administered per urethral meatus.     Pre-Procedure: - Inspection reveals a normal caliber ureteral meatus.  Procedure: The flexible cystoscope was introduced without difficulty - No urethral strictures/lesions are present. - Enlarged prostate  - Normal bladder neck - Bilateral ureteral orifices identified - Bladder mucosa  reveals no ulcers, tumors, or lesions - No bladder stones - No trabeculation  Using a grasper the right ureteral stent was removed intact   Post-Procedure: - Patient tolerated the procedure well  Assessment/ Plan: Schedule for left encrusted stent removal  No follow-ups on file.  [name], MD

## 2022-07-18 NOTE — H&P (View-Only) (Signed)
   07/18/22  CC: followup stent removal  HPI: [name] is a 71yo here for ureteral stent removal Blood pressure 131/79, pulse 90. NED. A&Ox3.   No respiratory distress   Abd soft, NT, ND Normal phallus with bilateral descended testicles  Cystoscopy Procedure Note  Patient identification was confirmed, informed consent was obtained, and patient was prepped using Betadine solution.  Lidocaine jelly was administered per urethral meatus.     Pre-Procedure: - Inspection reveals a normal caliber ureteral meatus.  Procedure: The flexible cystoscope was introduced without difficulty - No urethral strictures/lesions are present. - Enlarged prostate  - Normal bladder neck - Bilateral ureteral orifices identified - Bladder mucosa  reveals no ulcers, tumors, or lesions - No bladder stones - No trabeculation  Using a grasper the right ureteral stent was removed intact   Post-Procedure: - Patient tolerated the procedure well  Assessment/ Plan: Schedule for left encrusted stent removal  No follow-ups on file.  [name], MD

## 2022-07-19 DIAGNOSIS — Z23 Encounter for immunization: Secondary | ICD-10-CM | POA: Diagnosis not present

## 2022-07-19 LAB — URINALYSIS, ROUTINE W REFLEX MICROSCOPIC
Bilirubin, UA: NEGATIVE
Glucose, UA: NEGATIVE
Nitrite, UA: NEGATIVE
Specific Gravity, UA: 1.02 (ref 1.005–1.030)
Urobilinogen, Ur: 0.2 mg/dL (ref 0.2–1.0)
pH, UA: 6 (ref 5.0–7.5)

## 2022-07-19 LAB — MICROSCOPIC EXAMINATION: RBC, Urine: >30 /hpf — AB (ref 0–2)

## 2022-07-24 ENCOUNTER — Encounter (HOSPITAL_COMMUNITY): Payer: Self-pay

## 2022-07-24 ENCOUNTER — Encounter (HOSPITAL_COMMUNITY)
Admission: RE | Admit: 2022-07-24 | Discharge: 2022-07-24 | Disposition: A | Discharge status: Home / Self Care | Payer: Medicare Other | Source: Ambulatory Visit | Attending: Urology | Admitting: Urology

## 2022-07-24 ENCOUNTER — Other Ambulatory Visit: Payer: Self-pay

## 2022-07-24 NOTE — Patient Instructions (Signed)
[facility]  07/24/2022     '@[email or web address]'$ @   Your procedure is scheduled on  07/26/2022.   Report to [facility] at  0800  A.M.   Call this number if you have problems the morning of surgery:  [phone]  If you experience any cold or flu symptoms such as cough, fever, chills, shortness of breath, etc. between now and your scheduled surgery, please notify us at the above number.   Remember:  Do not eat or drink after midnight.      Take these medicines the morning of surgery with A SIP OF WATER                   prilosec, zofran (if needed), pyridium, rapaflo.     Do not wear jewelry, make-up or nail polish.  Do not wear lotions, powders, or perfumes, or deodorant.  Do not shave 48 hours prior to surgery.  Men may shave face and neck.  Do not bring valuables to the hospital.  [facility] is not responsible for any belongings or valuables.  Contacts, dentures or bridgework may not be worn into surgery.  Leave your suitcase in the car.  After surgery it may be brought to your room.  For patients admitted to the hospital, discharge time will be determined by your treatment team.  Patients discharged the day of surgery will not be allowed to drive home and must have someone with them for 24 hours.    Special instructions:   DO NOT smoke tobacco or vape for 24 hours before your procedure.  Please read over the following fact sheets that you were given. Coughing and Deep Breathing, Surgical Site Infection Prevention, Anesthesia Post-op Instructions, and Care and Recovery After Surgery         Cystoscopy Cystoscopy is a procedure that is used to help diagnose and sometimes treat conditions that affect the lower urinary tract. The lower urinary tract includes the bladder and the urethra. The urethra is the tube that drains urine from the bladder. Cystoscopy is done using a thin, tube-shaped instrument with a light and camera at the end (cystoscope). The  cystoscope may be hard or flexible, depending on the goal of the procedure. The cystoscope is inserted through the urethra, into the bladder. Cystoscopy may be recommended if you have: Urinary tract infections that keep coming back. Blood in the urine (hematuria). An inability to control when you urinate (urinary incontinence) or an overactive bladder. Unusual cells found in a urine sample. A blockage in the urethra, such as a urinary stone. Painful urination. An abnormality in the bladder found during an intravenous pyelogram (IVP) or CT scan. Cystoscopy may also be done to remove a sample of tissue to be examined under a microscope (biopsy). Tell a health care provider about: Any allergies you have. All medicines you are taking, including vitamins, herbs, eye drops, creams, and over-the-counter medicines. Any problems you or family members have had with anesthetic medicines. Any blood disorders you have. Any surgeries you have had. Any medical conditions you have. Whether you are pregnant or may be pregnant. What are the risks? Generally, this is a safe procedure. However, problems may occur, including: Infection. Bleeding. Allergic reactions to medicines. Damage to other structures or organs. What happens before the procedure? Medicines Ask your health care provider about: Changing or stopping your regular medicines. This is especially important if you are taking diabetes medicines or blood thinners. Taking medicines such as  aspirin and ibuprofen. These medicines can thin your blood. Do not take these medicines unless your health care provider tells you to take them. Taking over-the-counter medicines, vitamins, herbs, and supplements. Tests You may have an exam or testing, such as: X-rays of the bladder, urethra, or kidneys. CT scan of the abdomen or pelvis. Urine tests to check for signs of infection. General instructions Follow instructions from your health care provider  about eating or drinking restrictions. Ask your health care provider what steps will be taken to help prevent infection. These steps may include: Washing skin with a germ-killing soap. Taking antibiotic medicine. Plan to have a responsible adult take you home from the hospital or clinic. What happens during the procedure?  You will be given one or more of the following: A medicine to help you relax (sedative). A medicine to numb the area (local anesthetic). The area around the opening of your urethra will be cleaned. The cystoscope will be passed through your urethra into your bladder. Germ-free (sterile) fluid will flow through the cystoscope to fill your bladder. The fluid will stretch your bladder so that your health care provider can clearly examine your bladder walls. Your doctor will look at the urethra and bladder. Your doctor may take a biopsy or remove stones. The cystoscope will be removed, and your bladder will be emptied. The procedure may vary among health care providers and hospitals. What can I expect after the procedure? After the procedure, it is common to have: Some soreness or pain in your abdomen and urethra. Urinary symptoms. These include: Mild pain or burning when you urinate. Pain should stop within a few minutes after you urinate. This may last for up to 1 week. A small amount of blood in your urine for several days. Feeling like you need to urinate but producing only a small amount of urine. Follow these instructions at home: Medicines Take over-the-counter and prescription medicines only as told by your health care provider. If you were prescribed an antibiotic medicine, take it as told by your health care provider. Do not stop taking the antibiotic even if you start to feel better. General instructions Return to your normal activities as told by your health care provider. Ask your health care provider what activities are safe for you. If you were given a  sedative during the procedure, it can affect you for several hours. Do not drive or operate machinery until your health care provider says that it is safe. Watch for any blood in your urine. If the amount of blood in your urine increases, call your health care provider. Follow instructions from your health care provider about eating or drinking restrictions. If a tissue sample was removed for testing (biopsy) during your procedure, it is up to you to get your test results. Ask your health care provider, or the department that is doing the test, when your results will be ready. Drink enough fluid to keep your urine pale yellow. Keep all follow-up visits. This is important. Contact a health care provider if: You have pain that gets worse or does not get better with medicine, especially pain when you urinate. You have trouble urinating. You have more blood in your urine. Get help right away if: You have blood clots in your urine. You have abdominal pain. You have a fever or chills. You are unable to urinate. Summary Cystoscopy is a procedure that is used to help diagnose and sometimes treat conditions that affect the lower urinary tract. Cystoscopy is  done using a thin, tube-shaped instrument with a light and camera at the end. After the procedure, it is common to have some soreness or pain in your abdomen and urethra. Watch for any blood in your urine. If the amount of blood in your urine increases, call your health care provider. If you were prescribed an antibiotic medicine, take it as told by your health care provider. Do not stop taking the antibiotic even if you start to feel better. This information is not intended to replace advice given to you by your health care provider. Make sure you discuss any questions you have with your health care provider. Document Revised: 05/31/2021 Document Reviewed: 04/29/2020 Elsevier Patient Education  [place] Anesthesia, Adult, Care  After The following information offers guidance on how to care for yourself after your procedure. Your health care provider may also give you more specific instructions. If you have problems or questions, contact your health care provider. What can I expect after the procedure? After the procedure, it is common for people to: Have pain or discomfort at the IV site. Have nausea or vomiting. Have a sore throat or hoarseness. Have trouble concentrating. Feel cold or chills. Feel weak, sleepy, or tired (fatigue). Have soreness and body aches. These can affect parts of the body that were not involved in surgery. Follow these instructions at home: For the time period you were told by your health care provider:  Rest. Do not participate in activities where you could fall or become injured. Do not drive or use machinery. Do not drink alcohol. Do not take sleeping pills or medicines that cause drowsiness. Do not make important decisions or sign legal documents. Do not take care of children on your own. General instructions Drink enough fluid to keep your urine pale yellow. If you have sleep apnea, surgery and certain medicines can increase your risk for breathing problems. Follow instructions from your health care provider about wearing your sleep device: Anytime you are sleeping, including during daytime naps. While taking prescription pain medicines, sleeping medicines, or medicines that make you drowsy. Return to your normal activities as told by your health care provider. Ask your health care provider what activities are safe for you. Take over-the-counter and prescription medicines only as told by your health care provider. Do not use any products that contain nicotine or tobacco. These products include cigarettes, chewing tobacco, and vaping devices, such as e-cigarettes. These can delay incision healing after surgery. If you need help quitting, ask your health care provider. Contact a  health care provider if: You have nausea or vomiting that does not get better with medicine. You vomit every time you eat or drink. You have pain that does not get better with medicine. You cannot urinate or have bloody urine. You develop a skin rash. You have a fever. Get help right away if: You have trouble breathing. You have chest pain. You vomit blood. These symptoms may be an emergency. Get help right away. Call 911. Do not wait to see if the symptoms will go away. Do not drive yourself to the hospital. Summary After the procedure, it is common to have a sore throat, hoarseness, nausea, vomiting, or to feel weak, sleepy, or fatigue. For the time period you were told by your health care provider, do not drive or use machinery. Get help right away if you have difficulty breathing, have chest pain, or vomit blood. These symptoms may be an emergency. This information is not intended to  replace advice given to you by your health care provider. Make sure you discuss any questions you have with your health care provider. Document Revised: 12/15/2021 Document Reviewed: 12/15/2021 Elsevier Patient Education  [place].

## 2022-07-26 ENCOUNTER — Ambulatory Visit (HOSPITAL_COMMUNITY): Payer: Medicare Other | Admitting: Certified Registered Nurse Anesthetist

## 2022-07-26 ENCOUNTER — Ambulatory Visit (HOSPITAL_COMMUNITY): Payer: Medicare Other

## 2022-07-26 ENCOUNTER — Encounter (HOSPITAL_COMMUNITY)
Admission: RE | Disposition: A | Discharge status: Home / Self Care | Payer: Self-pay | Source: Home / Self Care | Attending: Urology

## 2022-07-26 ENCOUNTER — Ambulatory Visit (HOSPITAL_COMMUNITY)
Admission: RE | Admit: 2022-07-26 | Discharge: 2022-07-26 | Disposition: A | Discharge status: Home / Self Care | Payer: Medicare Other | Attending: Urology | Admitting: Urology

## 2022-07-26 ENCOUNTER — Encounter (HOSPITAL_COMMUNITY): Payer: Self-pay | Admitting: Urology

## 2022-07-26 ENCOUNTER — Ambulatory Visit (HOSPITAL_BASED_OUTPATIENT_CLINIC_OR_DEPARTMENT_OTHER): Payer: Medicare Other | Admitting: Certified Registered Nurse Anesthetist

## 2022-07-26 DIAGNOSIS — Z79899 Other long term (current) drug therapy: Secondary | ICD-10-CM | POA: Insufficient documentation

## 2022-07-26 DIAGNOSIS — N1330 Unspecified hydronephrosis: Secondary | ICD-10-CM | POA: Diagnosis not present

## 2022-07-26 DIAGNOSIS — Z466 Encounter for fitting and adjustment of urinary device: Secondary | ICD-10-CM | POA: Insufficient documentation

## 2022-07-26 DIAGNOSIS — K219 Gastro-esophageal reflux disease without esophagitis: Secondary | ICD-10-CM | POA: Diagnosis not present

## 2022-07-26 DIAGNOSIS — F32A Depression, unspecified: Secondary | ICD-10-CM | POA: Insufficient documentation

## 2022-07-26 DIAGNOSIS — T83192A Other mechanical complication of urinary stent, initial encounter: Secondary | ICD-10-CM

## 2022-07-26 DIAGNOSIS — I10 Essential (primary) hypertension: Secondary | ICD-10-CM | POA: Diagnosis not present

## 2022-07-26 DIAGNOSIS — F419 Anxiety disorder, unspecified: Secondary | ICD-10-CM | POA: Insufficient documentation

## 2022-07-26 DIAGNOSIS — T83193A Other mechanical complication of other urinary stent, initial encounter: Secondary | ICD-10-CM | POA: Diagnosis not present

## 2022-07-26 HISTORY — PX: CYSTOSCOPY W/ RETROGRADES: SHX1426

## 2022-07-26 SURGERY — CYSTOSCOPY, WITH RETROGRADE PYELOGRAM
Anesthesia: General | Site: Ureter | Laterality: Left

## 2022-07-26 MED ORDER — LIDOCAINE HCL (CARDIAC) PF 100 MG/5ML IV SOSY
PREFILLED_SYRINGE | INTRAVENOUS | Status: DC | PRN
  Administered 2022-07-26: 60 mg via INTRATRACHEAL

## 2022-07-26 MED ORDER — CEFAZOLIN SODIUM-DEXTROSE 2-4 GM/100ML-% IV SOLN
2.0000 g | INTRAVENOUS | Status: AC
  Administered 2022-07-26: 2 g via INTRAVENOUS

## 2022-07-26 MED ORDER — DIATRIZOATE MEGLUMINE 30 % UR SOLN
URETHRAL | Status: DC | PRN
  Administered 2022-07-26: 20 mL via URETHRAL

## 2022-07-26 MED ORDER — CEFAZOLIN SODIUM-DEXTROSE 2-4 GM/100ML-% IV SOLN
INTRAVENOUS | Status: AC
  Filled 2022-07-26: qty 100

## 2022-07-26 MED ORDER — PROPOFOL 10 MG/ML IV BOLUS
INTRAVENOUS | Status: AC
  Filled 2022-07-26: qty 20

## 2022-07-26 MED ORDER — LACTATED RINGERS IV SOLN: INTRAVENOUS | Status: DC

## 2022-07-26 MED ORDER — STERILE WATER FOR IRRIGATION IR SOLN
Status: DC | PRN
  Administered 2022-07-26: 250 mL

## 2022-07-26 MED ORDER — LIDOCAINE VISCOUS HCL 2 % MT SOLN
OROMUCOSAL | Status: AC
  Filled 2022-07-26: qty 15

## 2022-07-26 MED ORDER — SODIUM CHLORIDE 0.9 % IR SOLN
Status: DC | PRN
  Administered 2022-07-26: 3000 mL

## 2022-07-26 MED ORDER — LIDOCAINE HCL URETHRAL/MUCOSAL 2 % EX GEL
CUTANEOUS | Status: DC | PRN
  Administered 2022-07-26: 1

## 2022-07-26 MED ORDER — PROPOFOL 500 MG/50ML IV EMUL
INTRAVENOUS | Status: DC | PRN
  Administered 2022-07-26: 50 ug/kg/min via INTRAVENOUS

## 2022-07-26 MED ORDER — FENTANYL CITRATE (PF) 100 MCG/2ML IJ SOLN
INTRAMUSCULAR | Status: AC
  Filled 2022-07-26: qty 2

## 2022-07-26 MED ORDER — CHLORHEXIDINE GLUCONATE 0.12 % MT SOLN
OROMUCOSAL | Status: AC
  Administered 2022-07-26: 15 mL
  Filled 2022-07-26: qty 15

## 2022-07-26 MED ORDER — DIATRIZOATE MEGLUMINE 30 % UR SOLN
URETHRAL | Status: AC
  Filled 2022-07-26: qty 100

## 2022-07-26 MED ORDER — PROPOFOL 10 MG/ML IV BOLUS
INTRAVENOUS | Status: DC | PRN
  Administered 2022-07-26: 30 mg via INTRAVENOUS

## 2022-07-26 MED ORDER — LIDOCAINE HCL URETHRAL/MUCOSAL 2 % EX GEL
CUTANEOUS | Status: AC
  Filled 2022-07-26: qty 10

## 2022-07-26 MED ORDER — OXYCODONE-ACETAMINOPHEN 5-325 MG PO TABS: 1.0000 | ORAL_TABLET | ORAL | 0 refills | Status: AC | PRN

## 2022-07-26 SURGICAL SUPPLY — 17 items
BAG DRAIN URO TABLE W/ADPT NS (BAG) ×2 IMPLANT
BAG DRN 8 ADPR NS SKTRN CSTL (BAG) ×1
BAG HAMPER (MISCELLANEOUS) ×2 IMPLANT
CATH INTERMIT  6FR 70CM (CATHETERS) ×2 IMPLANT
CLOTH BEACON ORANGE TIMEOUT ST (SAFETY) ×2 IMPLANT
GLOVE BIO SURGEON STRL SZ8 (GLOVE) ×2 IMPLANT
GLOVE BIOGEL PI IND STRL 7.0 (GLOVE) ×4 IMPLANT
GOWN STRL REUS W/TWL LRG LVL3 (GOWN DISPOSABLE) ×2 IMPLANT
GOWN STRL REUS W/TWL XL LVL3 (GOWN DISPOSABLE) ×2 IMPLANT
GUIDEWIRE STR DUAL SENSOR (WIRE) IMPLANT
IV NS IRRIG 3000ML ARTHROMATIC (IV SOLUTION) ×2 IMPLANT
KIT TURNOVER CYSTO (KITS) ×2 IMPLANT
MANIFOLD NEPTUNE II (INSTRUMENTS) ×2 IMPLANT
PACK CYSTO (CUSTOM PROCEDURE TRAY) ×2 IMPLANT
PAD ARMBOARD 7.5X6 YLW CONV (MISCELLANEOUS) ×2 IMPLANT
TOWEL OR 17X26 4PK STRL BLUE (TOWEL DISPOSABLE) ×2 IMPLANT
WATER STERILE IRR 500ML POUR (IV SOLUTION) ×2 IMPLANT

## 2022-07-26 NOTE — Interval H&P Note (Signed)
History and Physical Interval Note:  07/26/2022 10:04 AM  Chad Butler  has presented today for surgery, with the diagnosis of retained ureteral stent.  The various methods of treatment have been discussed with the patient and family. After consideration of risks, benefits and other options for treatment, the patient has consented to  Procedure(s): CYSTOSCOPY WITH RETROGRADE PYELOGRAM- stent removal (Left) as a surgical intervention.  The patient's history has been reviewed, patient examined, no change in status, stable for surgery.  I have reviewed the patient's chart and labs.  Questions were answered to the patient's satisfaction.     [name]

## 2022-07-26 NOTE — Op Note (Signed)
Preoperative diagnosis: Retained left ureteral stnet  Postoperative diagnosis: Same  Procedure: 1 cystoscopy 2.  left retrograde pyelography 3.  Intraoperative fluoroscopy, under one hour, with interpretation 4.  Left ureteral stent removal  Attending: Rosie Fate  Anesthesia: MAC  Estimated blood loss: None  Drains: none  Specimens: none  Antibiotics: ancef  Findings: encrusted left ureteral stent. Severe left hydronephrosis.  Indications: Patient is a 71 year old male with a history of a retained left ureteral stent.  After discussing treatment options, she decided proceed with left ureteral stent removal.  Procedure in detail: The patient was brought to the operating room and a brief timeout was done to ensure correct patient, correct procedure, correct site.  General anesthesia was administered patient was placed in dorsal lithotomy position.  His genitalia was then prepped and draped in usual sterile fashion.  A rigid 6 French cystoscope was passed in the urethra and the bladder.  Bladder was inspected free masses or lesions.  the ureteral orifices were in the normal orthotopic locations.  a 6 french ureteral catheter was then instilled into the left ureter orifice.  a gentle retrograde was obtained and findings noted above.  Using a rigid grasper the left ureteral stent was removed intact.  the bladder was then drained and this concluded the procedure which was well tolerated by patient.  Complications: None  Condition: Stable, extubated, transferred to PACU  Plan: Pt is to followup in 2 weeks

## 2022-07-26 NOTE — Interval H&P Note (Signed)
History and Physical Interval Note:  07/26/2022 10:03 AM  Chad Butler  has presented today for surgery, with the diagnosis of retained ureteral stent.  The various methods of treatment have been discussed with the patient and family. After consideration of risks, benefits and other options for treatment, the patient has consented to  Procedure(s): CYSTOSCOPY WITH RETROGRADE PYELOGRAM- stent removal (Left) as a surgical intervention.  The patient's history has been reviewed, patient examined, no change in status, stable for surgery.  I have reviewed the patient's chart and labs.  Questions were answered to the patient's satisfaction.     [name]

## 2022-07-26 NOTE — Transfer of Care (Signed)
Immediate Anesthesia Transfer of Care Note  Patient: Chad Butler [facility]  Procedure(s) Performed: CYSTOSCOPY WITH RETROGRADE PYELOGRAM- stent removal (Left: Ureter)  Patient Location: Short Stay  Anesthesia Type:General  Level of Consciousness: awake  Airway & Oxygen Therapy: Patient Spontanous Breathing  Post-op Assessment: Report given to RN and Post -op Vital signs reviewed and stable  Post vital signs: Reviewed and stable  Last Vitals:  Vitals Value Taken Time  BP 113/74 07/26/22 1104  Temp 36.4 C 07/26/22 1104  Pulse 84 07/26/22 1104  Resp 14 07/26/22 1104  SpO2 100 % 07/26/22 1104    Last Pain:  Vitals:   07/26/22 1104  TempSrc: Oral  PainSc:          Complications: No notable events documented.

## 2022-07-26 NOTE — Anesthesia Preprocedure Evaluation (Signed)
Anesthesia Evaluation  Patient identified by MRN, date of birth, ID band Patient awake    Reviewed: Allergy & Precautions, H&P , NPO status , Patient's Chart, lab work & pertinent test results, reviewed documented beta blocker date and time   Airway Mallampati: II  TM Distance: >3 FB Neck ROM: full    Dental no notable dental hx.    Pulmonary neg pulmonary ROS,    Pulmonary exam normal breath sounds clear to auscultation       Cardiovascular Exercise Tolerance: Good hypertension, negative cardio ROS   Rhythm:regular Rate:Normal     Neuro/Psych PSYCHIATRIC DISORDERS Anxiety Depression negative neurological ROS     GI/Hepatic negative GI ROS, Neg liver ROS, GERD  Medicated,  Endo/Other  negative endocrine ROS  Renal/GU negative Renal ROS  negative genitourinary   Musculoskeletal   Abdominal   Peds  Hematology negative hematology ROS (+)   Anesthesia Other Findings   Reproductive/Obstetrics negative OB ROS                             Anesthesia Physical Anesthesia Plan  ASA: 2  Anesthesia Plan: General   Post-op Pain Management:    Induction:   PONV Risk Score and Plan: Propofol infusion  Airway Management Planned:   Additional Equipment:   Intra-op Plan:   Post-operative Plan:   Informed Consent: I have reviewed the patients History and Physical, chart, labs and discussed the procedure including the risks, benefits and alternatives for the proposed anesthesia with the patient or authorized representative who has indicated his/her understanding and acceptance.     Dental Advisory Given  Plan Discussed with: CRNA  Anesthesia Plan Comments:         Anesthesia Quick Evaluation

## 2022-07-27 NOTE — Anesthesia Postprocedure Evaluation (Signed)
Anesthesia Post Note  Patient: Chad Butler  Procedure(s) Performed: CYSTOSCOPY WITH RETROGRADE PYELOGRAM- stent removal (Left: Ureter)  Patient location during evaluation: Phase II Anesthesia Type: General Level of consciousness: awake Pain management: pain level controlled Vital Signs Assessment: post-procedure vital signs reviewed and stable Respiratory status: spontaneous breathing and respiratory function stable Cardiovascular status: blood pressure returned to baseline and stable Postop Assessment: no headache and no apparent nausea or vomiting Anesthetic complications: no Comments: Late entry   No notable events documented.   Last Vitals:  Vitals:   07/26/22 1004 07/26/22 1104  BP: (!) 159/86 113/74  Pulse:  84  Resp:  14  Temp:  (!) 36.4 C  SpO2:  100%    Last Pain:  Vitals:   07/26/22 1104  TempSrc: Oral  PainSc: 0-No pain                 Louann Sjogren

## 2022-07-31 ENCOUNTER — Encounter (HOSPITAL_COMMUNITY): Payer: Self-pay | Admitting: Urology

## 2022-08-01 DIAGNOSIS — H04123 Dry eye syndrome of bilateral lacrimal glands: Secondary | ICD-10-CM | POA: Diagnosis not present

## 2022-08-02 ENCOUNTER — Ambulatory Visit (INDEPENDENT_AMBULATORY_CARE_PROVIDER_SITE_OTHER): Payer: Medicare Other | Admitting: Physician Assistant

## 2022-08-02 VITALS — BP 146/80 | HR 82 | Wt 165.0 lb

## 2022-08-02 DIAGNOSIS — Z8551 Personal history of malignant neoplasm of bladder: Secondary | ICD-10-CM | POA: Diagnosis not present

## 2022-08-02 DIAGNOSIS — N1330 Unspecified hydronephrosis: Secondary | ICD-10-CM | POA: Diagnosis not present

## 2022-08-02 DIAGNOSIS — R339 Retention of urine, unspecified: Secondary | ICD-10-CM

## 2022-08-02 DIAGNOSIS — C678 Malignant neoplasm of overlapping sites of bladder: Secondary | ICD-10-CM

## 2022-08-02 LAB — BLADDER SCAN AMB NON-IMAGING: Scan Result: 163

## 2022-08-02 NOTE — Progress Notes (Signed)
2:02 PM  post void residual =163

## 2022-08-02 NOTE — Progress Notes (Signed)
Assessment: 1. Malignant neoplasm of overlapping sites of bladder (HCC) - Urinalysis, Routine w reflex microscopic - BLADDER SCAN AMB NON-IMAGING  2. Hydronephrosis, unspecified hydronephrosis type - Ultrasound renal complete; Future  3. Incomplete bladder emptying    Plan: UA and PVR at FU visit. Continue Silodocin and will repeat renal US 6 weeks post op. Will discuss initiation of BCG tx and plan for BPH with Dr. Alyson Ingles. Dr. Alyson Ingles agrees with plan to initiate BCG tx and pt will FU for first dose with him.   Chief Complaint: No chief complaint on file.   HPI: Chad Butler is a 71 y.o. male with h/o bladder cancer, BPH, and B hydro, who presents for post op evaluation of retained ureteral stent with cystoscopy and left ureteral stent removal with fluoroscopy performed on 07/26/22. Right side removed in office on 10/18. Left was encrusted at that time. Pt voiding sxs have improved significantly since stent removal and his only c/o new post void dribbling and feeling of incomplete emptying with weak stream since surgery. Noct x 2. No gross hematuria, dysuria, burning. He continues Rapaflo BID. Pt asks if he can start finasteride for BPH.   Renal US on 7/17 indicated significant bilateral hydronephrosis  PVR=158m UA=clear IPSS=9, QOL=3  06/07/22 Chad CRISPis a 71y.o. male who presents for evaluation of acute onset urinary frequency with passing some intermittent blood and clots. He continues to have burning on occasion. No fever, chills, abdominal pain. No difficulty passing urine. Pt scheduled for stent removal on 07/18/22.   UA= 6-10 WBC, moderate bacteria, nitrite negative   Culture on 05/22/22-no growth Previous cxs noted below   05/22/22 Chad CHADDERDONis a 71y.o. male with h/o bladder CA, BPH and hydronephrosis who presents for acute visit for evaluation of burning and worsening of urinary frequency, urgency, intermittency, weakness of stream over the past  week.  Patient was treated for UTI last month with recent culture history below. He denies fever, chills, NV, flank pain, gross hematuria Urine culture on 04/18/2022 grew Staphylococcus hemolyticus, 25-50,000 colonies, resistant to Cipro oxacillin penicillin, and trimethoprim sulfa.  Intermediate resistance to moxifloxacin and Levaquin. 03/23/2022= greater than 100,000 colonies of Staphylococcus hemolyticus and 20,000 colonies of diphtheroids, similar resistance pattern.   04/18/22 Chad Butler a 89yohere for followup for BPH and hydronephrosis. IPSS 8 QOl 1 on rapaflo '8mg'$ . Urine strema strong. Nocturia 0-1x. Renal [place]7/17 shows stable hydronephrosis. He denies flank pain.   Portions of the above documentation were copied from a prior visit for review purposes only.  Allergies: Allergies  Allergen Reactions   Red Dye Nausea And Vomiting   Gadolinium Nausea And Vomiting     Code: VOM, with injection, no other reactions noted, Onset Date: [id number]    PMH: Past Medical History:  Diagnosis Date   Anxiety    Bladder [place]    Depression    Hemorrhoids    Hypertension     PSH: Past Surgical History:  Procedure Laterality Date   [facility]  [facility]  [facility]   COLONOSCOPY  11/09/2011   Dr. FOneida Alar two tubular adenomas, mild diverticulosis, internal hemorrhoids, surveillance in 10 years    COLONOSCOPY WITH PROPOFOL N/A 03/20/2022   Procedure: COLONOSCOPY WITH PROPOFOL;  Surgeon: CEloise Harman DO;  [facility];  Service: Endoscopy;  Laterality: N/A;  9:30 / ASA 2   CYST EXCISION  1970's  back   CYSTOSCOPY W/ RETROGRADES Bilateral 08/17/2021   Procedure: CYSTOSCOPY WITH RETROGRADE PYELOGRAM;  Surgeon: Cleon Gustin, MD;  Location: [facility];  Service: Urology;  Laterality: Bilateral;   CYSTOSCOPY W/ RETROGRADES Left 09/07/2021   Procedure: CYSTOSCOPY WITH RETROGRADE PYELOGRAM;  Surgeon: Cleon Gustin, MD;  Location: [facility];  Service: Urology;  Laterality: Left;   CYSTOSCOPY W/ RETROGRADES Left 07/26/2022   Procedure: CYSTOSCOPY WITH RETROGRADE PYELOGRAM- stent removal;  Surgeon: Cleon Gustin, MD;  Location: [facility];  Service: Urology;  Laterality: Left;   CYSTOSCOPY W/ URETERAL STENT PLACEMENT Bilateral 01/18/2022   Procedure: CYSTOSCOPY WITH RETROGRADE PYELOGRAM/ BILATERAL URETERAL STENT PLACEMENT;  Surgeon: Cleon Gustin, MD;  Location: [facility];  Service: Urology;  Laterality: Bilateral;   ESOPHAGOGASTRODUODENOSCOPY  10/07/2012   Dr. Arnoldo Morale: normal, Clotest negative   HERNIA REPAIR  1956   INGUINAL HERNIA REPAIR Right 11/04/2013   Procedure: RECURRENT RIGHT INGUINAL HERNIA REPAIR;  Surgeon: Jamesetta So, MD;  Location: [facility];  Service: General;  Laterality: Right;   Left shoulder surgery  2011   LESION REMOVAL N/A 11/04/2013   Procedure: EXCISION OF SKIN LESION ABDOMINAL WALL;  Surgeon: Jamesetta So, MD;  Location: [facility];  Service: General;  Laterality: N/A;   POLYPECTOMY  03/20/2022   Procedure: POLYPECTOMY;  Surgeon: Eloise Harman, DO;  [facility];  Service: Endoscopy;;   TRANSURETHRAL RESECTION OF BLADDER TUMOR N/A 08/17/2021   Procedure: TRANSURETHRAL RESECTION OF BLADDER TUMOR (TURBT);  Surgeon: Cleon Gustin, MD;  Location: [facility];  Service: Urology;  Laterality: N/A;   TRANSURETHRAL RESECTION OF BLADDER TUMOR N/A 01/18/2022   Procedure: TRANSURETHRAL RESECTION OF BLADDER TUMOR (TURBT);  Surgeon: Cleon Gustin, MD;  Location: [facility];  Service: Urology;  Laterality: N/A;   URETEROSCOPY Left 09/07/2021   Procedure: URETEROSCOPY-DIAGNOSTIC;  Surgeon: Cleon Gustin, MD;  Location: [facility];  Service: Urology;  Laterality: Left;    SH: Social History   Tobacco Use   Smoking status: Never   Smokeless tobacco: Never  Vaping Use   Vaping Use: Never used  Substance Use Topics   Alcohol use: Yes    Comment: 2 oz wine some days   Drug use: No     ROS: All other review of systems were reviewed and are negative except what is noted above in HPI  PE: BP (!) 146/80   Pulse 82   Wt 165 lb (74.8 kg)   BMI 22.38 kg/m  GENERAL APPEARANCE:  Well appearing, well developed, NAD HEENT:  Atraumatic, normocephalic NECK:  Supple. Trachea midline ABDOMEN:  Soft, non-tender, no masses, no CVAT EXTREMITIES:  Moves all extremities well NEUROLOGIC:  Alert and oriented x 3 MENTAL STATUS:  appropriate SKIN:  Warm, dry, and intact   Results: Laboratory Data: Lab Results  Component Value Date   WBC 8.0 08/16/2021   HGB 16.6 08/16/2021   HCT 48.9 08/16/2021   MCV 96.3 08/16/2021   PLT 241 08/16/2021    Lab Results  Component Value Date   CREATININE 1.19 12/15/2021    No results found for: "PSA"  No results found for: "TESTOSTERONE"  No results found for: "HGBA1C"  Urinalysis    Component Value Date/Time   COLORURINE STRAW (A) 07/07/2021 1047   APPEARANCEUR Cloudy (A) 07/18/2022 1029   LABSPEC 1.002 (L) 07/07/2021 1047   PHURINE 7.0 07/07/2021 1047   GLUCOSEU Negative 07/18/2022 1029   HGBUR LARGE (A) 07/07/2021 1047   BILIRUBINUR Negative 07/18/2022  [place] negative 03/23/2022 [place] 07/07/2021 1047   PROTEINUR 3+ (A) 07/18/2022 1029   PROTEINUR NEGATIVE 07/07/2021 1047   UROBILINOGEN 0.2 03/23/2022 1659   UROBILINOGEN 2.0 (H) 11/04/2009 1425   NITRITE Negative 07/18/2022 1029   NITRITE NEGATIVE 07/07/2021 1047   LEUKOCYTESUR 1+ (A) 07/18/2022 1029   LEUKOCYTESUR TRACE (A) 07/07/2021 1047    Lab Results  Component Value Date   LABMICR See below: 07/18/2022   WBCUA 0-5 07/18/2022   LABEPIT 0-10 07/18/2022   MUCUS Present 05/22/2022   BACTERIA Few 07/18/2022    Pertinent Imaging: Results for orders placed during the hospital encounter of 01/14/04  DG Abd 1 View  Narrative Clinical Data: Bladder calculi. ABDOMEN ONE VIEW CT scan on 03/11/03 raised the question of possible  bladder calculi versus sessile bladder mass with calcification. Metallic clips, right upper quadrant secondary to cholecystectomy. Unremarkable bowel gas pattern. No definite urinary tract calcifications. There is small amount of stool in the rectum precluding optimal evaluation for bladder calculi. IMPRESSION Unremarkable plain film of the abdomen. See comments above.  Provider: Briscoe Burns, Orene Desanctis Ischen  No results found for this or any previous visit.  No results found for this or any previous visit.  No results found for this or any previous visit.  Results for orders placed during the hospital encounter of 04/16/22  Ultrasound renal complete  Narrative CLINICAL DATA:  Bilateral hydronephrosis. History of bladder cancer and surgery.  EXAM: RENAL / URINARY TRACT ULTRASOUND COMPLETE  COMPARISON:  CT 12/19/2021.  Ultrasound 11/20/2021.  FINDINGS: Right Kidney:  Renal measurements: 11.7 x 5.7 x 6.1 cm = volume: 210.3 mL. Echogenicity within normal limits. No mass. Persistent prominent right-sided hydronephrosis noted. Right ureter poorly visualized.  Left Kidney:  Renal measurements: 13.9 x 6.3 x 5.0 cm = volume: 231.1 mL. Echogenicity within normal limits. No mass. Persistent prominent left-sided hydronephrosis and hydroureter.  Bladder:  Prominent multifocal bladder wall irregular thickening again noted. Patient voided prior to the study, no bladder distention. Postvoid residual is 74.4 cc. Bilateral ureteral stents appear to be present.  Other:  None.  IMPRESSION: 1. Persistent prominent right-sided hydronephrosis. Right ureter poorly visualized. Persistent prominent left-sided hydronephrosis and left hydroureter. Bilateral ureteral stents appear to be present.  2. Prominent multifocal bladder wall irregular thickening again noted. Patient voided prior to the study, no bladder distention. Postvoid residual is 74.4 cc.   Electronically Signed By: Marcello Moores   Register M.D. On: 04/17/2022 06:28  No valid procedures specified. No results found for this or any previous visit.  Results for orders placed during the hospital encounter of 07/07/21  CT Renal Stone Study  Narrative CLINICAL DATA:  Hematuria and urinary frequency. History of bladder cancer.  EXAM: CT ABDOMEN AND PELVIS WITHOUT CONTRAST  TECHNIQUE: Multidetector CT imaging of the abdomen and pelvis was performed following the standard protocol without IV contrast.  COMPARISON:  CT abdomen pelvis 09/13/2016  FINDINGS: Lower chest: No acute abnormality.  Hepatobiliary: Calcified granuloma in the right lobe. No focal hepatic lesion. Status post cholecystectomy. No biliary dilatation.  Pancreas: Diffuse severe parenchymal atrophy and fatty infiltration. The main pancreatic duct is not dilated. No peripancreatic fat stranding or fluid collection.  Spleen: Normal size.  Multiple calcified granulomas.  Adrenals/Urinary Tract: Right adrenal gland is unremarkable. Resolution of the fluid density at the left adrenal gland with interval development of calcifications, consistent with postsurgical changes. No hydronephrosis. Left prominent extrarenal pelvis with mild dilatation of the left ureter.  A 0.2 cm calcified stone is noted in the posterior left aspect of the urinary bladder, just inferior to the left UVJ (series 2, image 67; series 5, image 69). Within the urinary bladder at the right UVJ, there is a 0.3 cm calcified stone (series 2, image 69; series 5, image 65). No perinephric fat stranding or fluid collection bilaterally.  Stomach/Bowel: Patulous distal esophagus. Stomach is within normal limits. Appendix appears normal. No evidence of bowel wall thickening, distention, or inflammatory changes.  Vascular/Lymphatic: Mild scattered calcific atherosclerosis of the abdominal aorta. No abdominal aortic aneurysm. No enlarged lymph nodes in the abdomen or  pelvis.  Reproductive: Prostatomegaly with mass effect on the posteroinferior aspect of the urinary bladder.  Other: No abdominal wall hernia or abnormality. No abdominopelvic ascites.  Musculoskeletal: No acute or significant osseous findings.  IMPRESSION: 1. A 0.3 cm stone is noted within the bladder at the right UVJ. An additional 0.2 cm stone is noted within the bladder, just inferior to the left UVJ. Findings may represent recently passed stones. No additional renal stones or hydronephrosis. 2. No findings of metastatic disease in the abdomen or pelvis.   Electronically Signed By: Ileana Roup M.D. On: 07/07/2021 12:24  No results found for this or any previous visit (from the past 24 hour(s)).

## 2022-08-03 ENCOUNTER — Encounter: Payer: Self-pay | Admitting: Physician Assistant

## 2022-08-03 LAB — URINALYSIS, ROUTINE W REFLEX MICROSCOPIC
Bilirubin, UA: NEGATIVE
Glucose, UA: NEGATIVE
Ketones, UA: NEGATIVE
Leukocytes,UA: NEGATIVE
Nitrite, UA: NEGATIVE
Protein,UA: NEGATIVE
Specific Gravity, UA: 1.02 (ref 1.005–1.030)
Urobilinogen, Ur: 0.2 mg/dL (ref 0.2–1.0)
pH, UA: 6 (ref 5.0–7.5)

## 2022-08-15 ENCOUNTER — Ambulatory Visit (INDEPENDENT_AMBULATORY_CARE_PROVIDER_SITE_OTHER): Payer: Medicare Other | Admitting: Urology

## 2022-08-15 VITALS — BP 149/70 | HR 84

## 2022-08-15 DIAGNOSIS — C678 Malignant neoplasm of overlapping sites of bladder: Secondary | ICD-10-CM | POA: Diagnosis not present

## 2022-08-15 DIAGNOSIS — R339 Retention of urine, unspecified: Secondary | ICD-10-CM

## 2022-08-15 DIAGNOSIS — N401 Enlarged prostate with lower urinary tract symptoms: Secondary | ICD-10-CM

## 2022-08-15 DIAGNOSIS — N138 Other obstructive and reflux uropathy: Secondary | ICD-10-CM

## 2022-08-15 MED ORDER — BCG LIVE 50 MG IS SUSR: 3.2400 mL | Freq: Once | INTRAVESICAL | Status: DC

## 2022-08-15 MED ORDER — SILODOSIN 8 MG PO CAPS: 8.0000 mg | ORAL_CAPSULE | Freq: Two times a day (BID) | ORAL | 11 refills | Status: DC

## 2022-08-15 NOTE — Progress Notes (Signed)
08/15/2022 1:51 PM   Chad Butler May 01, 1951 [id number]  Referring provider: Redmond School, MD [address] [place],  [place] [address]  Followup urinary retention and bladder cancer   HPI: Chad Butler is a 71yo here for folllowup for bladder cancer and urinary retention. He was scheduled to start BCG today but the patient does not want to pursue BCG therapy for 2-3 weeks. He denies any worsening LUTS. IPSS 11 QOL 2. Urine stream strong. No straining to urinate.    PMH: Past Medical History:  Diagnosis Date   Anxiety    Bladder cancer ([place])    Cancer ([place])    Depression    Hemorrhoids    Hypertension     Surgical History: Past Surgical History:  Procedure Laterality Date   ADRENALECTOMY     [facility]   CHOLECYSTECTOMY  2001   COLONOSCOPY  11/09/2011   Dr. Oneida Alar: two tubular adenomas, mild diverticulosis, internal hemorrhoids, surveillance in 10 years    COLONOSCOPY WITH PROPOFOL N/A 03/20/2022   Procedure: COLONOSCOPY WITH PROPOFOL;  Surgeon: Eloise Harman, DO;  [facility];  Service: Endoscopy;  Laterality: N/A;  9:30 / ASA 2   CYST EXCISION  1970's   back   CYSTOSCOPY W/ RETROGRADES Bilateral 08/17/2021   Procedure: CYSTOSCOPY WITH RETROGRADE PYELOGRAM;  Surgeon: Cleon Gustin, MD;  Location: [facility];  Service: Urology;  Laterality: Bilateral;   CYSTOSCOPY W/ RETROGRADES Left 09/07/2021   Procedure: CYSTOSCOPY WITH RETROGRADE PYELOGRAM;  Surgeon: Cleon Gustin, MD;  Location: [facility];  Service: Urology;  Laterality: Left;   CYSTOSCOPY W/ RETROGRADES Left 07/26/2022   Procedure: CYSTOSCOPY WITH RETROGRADE PYELOGRAM- stent removal;  Surgeon: Cleon Gustin, MD;  Location: [facility];  Service: Urology;  Laterality: Left;   CYSTOSCOPY W/ URETERAL STENT PLACEMENT Bilateral 01/18/2022   Procedure: CYSTOSCOPY WITH RETROGRADE PYELOGRAM/ BILATERAL URETERAL STENT PLACEMENT;  Surgeon: Cleon Gustin, MD;  Location: [facility];  Service: Urology;   Laterality: Bilateral;   ESOPHAGOGASTRODUODENOSCOPY  10/07/2012   Dr. Arnoldo Morale: normal, Clotest negative   HERNIA REPAIR  1956   INGUINAL HERNIA REPAIR Right 11/04/2013   Procedure: RECURRENT RIGHT INGUINAL HERNIA REPAIR;  Surgeon: Jamesetta So, MD;  Location: [facility];  Service: General;  Laterality: Right;   Left shoulder surgery  2011   LESION REMOVAL N/A 11/04/2013   Procedure: EXCISION OF SKIN LESION ABDOMINAL WALL;  Surgeon: Jamesetta So, MD;  Location: [facility];  Service: General;  Laterality: N/A;   POLYPECTOMY  03/20/2022   Procedure: POLYPECTOMY;  Surgeon: Eloise Harman, DO;  [facility];  Service: Endoscopy;;   TRANSURETHRAL RESECTION OF BLADDER TUMOR N/A 08/17/2021   Procedure: TRANSURETHRAL RESECTION OF BLADDER TUMOR (TURBT);  Surgeon: Cleon Gustin, MD;  Location: [facility];  Service: Urology;  Laterality: N/A;   TRANSURETHRAL RESECTION OF BLADDER TUMOR N/A 01/18/2022   Procedure: TRANSURETHRAL RESECTION OF BLADDER TUMOR (TURBT);  Surgeon: Cleon Gustin, MD;  Location: [facility];  Service: Urology;  Laterality: N/A;   URETEROSCOPY Left 09/07/2021   Procedure: URETEROSCOPY-DIAGNOSTIC;  Surgeon: Cleon Gustin, MD;  Location: [facility];  Service: Urology;  Laterality: Left;    Home Medications:  Allergies as of 08/15/2022       Reactions   Red Dye Nausea And Vomiting   Gadolinium Nausea And Vomiting    Code: VOM, with injection, no other reactions noted, Onset Date: 03474259        Medication List  Accurate as of August 15, 2022  1:51 PM. If you have any questions, ask your nurse or doctor.          ALPRAZolam 0.5 MG tablet Commonly known as: XANAX Take 0.5 mg by mouth at bedtime. For sleep.   amoxicillin-clavulanate 875-125 MG tablet Commonly known as: AUGMENTIN Take 1 tablet by mouth every 12 (twelve) hours.   aspirin EC 81 MG tablet Take 81 mg by mouth 4 (four) times a week. Swallow whole.   benzonatate 200 MG  capsule Commonly known as: TESSALON Take 200 mg by mouth daily as needed for cough.   diphenhydrAMINE 2 % cream Commonly known as: BENADRYL Apply 1 Application topically 3 (three) times daily as needed for itching.   docusate sodium 100 MG capsule Commonly known as: COLACE Take 100 mg by mouth daily as needed for constipation.   doxylamine (Sleep) 25 MG tablet Commonly known as: UNISOM Take 12.5 mg by mouth at bedtime as needed for sleep.   FISH OIL PO Take 2,000 mg by mouth 3 (three) times a week.   GOODY HEADACHE PO Take 1 packet by mouth daily as needed (headaches.).   ibuprofen 200 MG tablet Commonly known as: ADVIL Take 200 mg by mouth every 8 (eight) hours as needed for headache or moderate pain.   multivitamin with minerals Tabs tablet Take 1 tablet by mouth in the morning. Centrum Silver   omeprazole 40 MG capsule Commonly known as: PRILOSEC Take 40 mg by mouth daily with lunch.   ondansetron 4 MG tablet Commonly known as: ZOFRAN TAKE 1 TABLET BY MOUTH EVERY 8 HOURS AS NEEDED FOR NAUSEA.   oxyCODONE-acetaminophen 5-325 MG tablet Commonly known as: Percocet Take 1 tablet by mouth every 4 (four) hours as needed for severe pain.   phenazopyridine 100 MG tablet Commonly known as: Pyridium Take 1 tablet (100 mg total) by mouth 3 (three) times daily as needed for pain.   rOPINIRole 0.5 MG tablet Commonly known as: REQUIP Take 0.5 mg by mouth at bedtime.   silodosin 8 MG Caps capsule Commonly known as: RAPAFLO Take 1 capsule (8 mg total) by mouth in the morning and at bedtime.   VITAMIN B-12 PO Take 1 tablet by mouth 3 (three) times a week.        Allergies:  Allergies  Allergen Reactions   Red Dye Nausea And Vomiting   Gadolinium Nausea And Vomiting     Code: VOM, with injection, no other reactions noted, Onset Date: [id number]     Family History: Family History  Problem Relation Age of Onset   Hypertension Mother    Cancer Father    Colon  cancer Neg Hx    Diabetes Neg Hx    Thyroid disease Neg Hx     Social History:  reports that he has never smoked. He has never used smokeless tobacco. He reports current alcohol use. He reports that he does not use drugs.  ROS: All other review of systems were reviewed and are negative except what is noted above in HPI  Physical Exam: BP (!) 149/70   Pulse 84   Constitutional:  Alert and oriented, No acute distress. HEENT: [place] AT, moist mucus membranes.  Trachea midline, no masses. Cardiovascular: No clubbing, cyanosis, or edema. Respiratory: Normal respiratory effort, no increased work of breathing. GI: Abdomen is soft, nontender, nondistended, no abdominal masses GU: No CVA tenderness.  Lymph: No cervical or inguinal lymphadenopathy. Skin: No rashes, bruises or suspicious lesions. Neurologic: Grossly intact,  no focal deficits, moving all 4 extremities. Psychiatric: Normal mood and affect.  Laboratory Data: Lab Results  Component Value Date   WBC 8.0 08/16/2021   HGB 16.6 08/16/2021   HCT 48.9 08/16/2021   MCV 96.3 08/16/2021   PLT 241 08/16/2021    Lab Results  Component Value Date   CREATININE 1.19 12/15/2021    No results found for: "PSA"  No results found for: "TESTOSTERONE"  No results found for: "HGBA1C"  Urinalysis    Component Value Date/Time   COLORURINE STRAW (A) 07/07/2021 1047   APPEARANCEUR Clear 08/02/2022 1343   LABSPEC 1.002 (L) 07/07/2021 1047   PHURINE 7.0 07/07/2021 1047   GLUCOSEU Negative 08/02/2022 1343   HGBUR LARGE (A) 07/07/2021 1047   BILIRUBINUR Negative 08/02/2022 1343   KETONESUR negative 03/23/2022 1659   KETONESUR NEGATIVE 07/07/2021 1047   PROTEINUR Negative 08/02/2022 1343   PROTEINUR NEGATIVE 07/07/2021 1047   UROBILINOGEN 0.2 03/23/2022 1659   UROBILINOGEN 2.0 (H) 11/04/2009 1425   NITRITE Negative 08/02/2022 1343   NITRITE NEGATIVE 07/07/2021 1047   LEUKOCYTESUR Negative 08/02/2022 1343   LEUKOCYTESUR TRACE (A)  07/07/2021 1047    Lab Results  Component Value Date   LABMICR See below: 07/18/2022   WBCUA 0-5 07/18/2022   LABEPIT 0-10 07/18/2022   MUCUS Present 05/22/2022   BACTERIA Few 07/18/2022    Pertinent Imaging:  Results for orders placed during the hospital encounter of 01/14/04  DG Abd 1 View  Narrative Clinical Data: Bladder calculi. ABDOMEN ONE VIEW CT scan on 03/11/03 raised the question of possible bladder calculi versus sessile bladder mass with calcification. Metallic clips, right upper quadrant secondary to cholecystectomy. Unremarkable bowel gas pattern. No definite urinary tract calcifications. There is small amount of stool in the rectum precluding optimal evaluation for bladder calculi. IMPRESSION Unremarkable plain film of the abdomen. See comments above.  Provider: Briscoe Burns, Orene Desanctis Ischen  No results found for this or any previous visit.  No results found for this or any previous visit.  No results found for this or any previous visit.  Results for orders placed during the hospital encounter of 04/16/22  Ultrasound renal complete  Narrative CLINICAL DATA:  Bilateral hydronephrosis. History of bladder cancer and surgery.  EXAM: RENAL / URINARY TRACT ULTRASOUND COMPLETE  COMPARISON:  CT 12/19/2021.  Ultrasound 11/20/2021.  FINDINGS: Right Kidney:  Renal measurements: 11.7 x 5.7 x 6.1 cm = volume: 210.3 mL. Echogenicity within normal limits. No mass. Persistent prominent right-sided hydronephrosis noted. Right ureter poorly visualized.  Left Kidney:  Renal measurements: 13.9 x 6.3 x 5.0 cm = volume: 231.1 mL. Echogenicity within normal limits. No mass. Persistent prominent left-sided hydronephrosis and hydroureter.  Bladder:  Prominent multifocal bladder wall irregular thickening again noted. Patient voided prior to the study, no bladder distention. Postvoid residual is 74.4 cc. Bilateral ureteral stents appear to be  present.  Other:  None.  IMPRESSION: 1. Persistent prominent right-sided hydronephrosis. Right ureter poorly visualized. Persistent prominent left-sided hydronephrosis and left hydroureter. Bilateral ureteral stents appear to be present.  2. Prominent multifocal bladder wall irregular thickening again noted. Patient voided prior to the study, no bladder distention. Postvoid residual is 74.4 cc.   Electronically Signed By: Marcello Moores  Register M.D. On: 04/17/2022 06:28  No valid procedures specified. No results found for this or any previous visit.  Results for orders placed during the hospital encounter of 07/07/21  CT Renal Stone Study  Narrative CLINICAL DATA:  Hematuria and urinary frequency. History of bladder  cancer.  EXAM: CT ABDOMEN AND PELVIS WITHOUT CONTRAST  TECHNIQUE: Multidetector CT imaging of the abdomen and pelvis was performed following the standard protocol without IV contrast.  COMPARISON:  CT abdomen pelvis 09/13/2016  FINDINGS: Lower chest: No acute abnormality.  Hepatobiliary: Calcified granuloma in the right lobe. No focal hepatic lesion. Status post cholecystectomy. No biliary dilatation.  Pancreas: Diffuse severe parenchymal atrophy and fatty infiltration. The main pancreatic duct is not dilated. No peripancreatic fat stranding or fluid collection.  Spleen: Normal size.  Multiple calcified granulomas.  Adrenals/Urinary Tract: Right adrenal gland is unremarkable. Resolution of the fluid density at the left adrenal gland with interval development of calcifications, consistent with postsurgical changes. No hydronephrosis. Left prominent extrarenal pelvis with mild dilatation of the left ureter. A 0.2 cm calcified stone is noted in the posterior left aspect of the urinary bladder, just inferior to the left UVJ (series 2, image 67; series 5, image 69). Within the urinary bladder at the right UVJ, there is a 0.3 cm calcified stone (series 2,  image 69; series 5, image 65). No perinephric fat stranding or fluid collection bilaterally.  Stomach/Bowel: Patulous distal esophagus. Stomach is within normal limits. Appendix appears normal. No evidence of bowel wall thickening, distention, or inflammatory changes.  Vascular/Lymphatic: Mild scattered calcific atherosclerosis of the abdominal aorta. No abdominal aortic aneurysm. No enlarged lymph nodes in the abdomen or pelvis.  Reproductive: Prostatomegaly with mass effect on the posteroinferior aspect of the urinary bladder.  Other: No abdominal wall hernia or abnormality. No abdominopelvic ascites.  Musculoskeletal: No acute or significant osseous findings.  IMPRESSION: 1. A 0.3 cm stone is noted within the bladder at the right UVJ. An additional 0.2 cm stone is noted within the bladder, just inferior to the left UVJ. Findings may represent recently passed stones. No additional renal stones or hydronephrosis. 2. No findings of metastatic disease in the abdomen or pelvis.   Electronically Signed By: Ileana Roup M.D. On: 07/07/2021 12:24   Assessment & Plan:    1. Malignant neoplasm of overlapping sites of bladder (HCC) -Followup 2-3 weeks to start BCG - Urinalysis, Routine w reflex microscopic   2. Incomplete bladder emptying Continue rapaflo '8mg'$  daily  3. BPH with obstruction/lower urinary tract symptoms -Continue rapaflo '8mg'$  daily   No follow-ups on file.  [name], MD  [facility] [place]

## 2022-08-16 LAB — URINALYSIS, ROUTINE W REFLEX MICROSCOPIC
Bilirubin, UA: NEGATIVE
Glucose, UA: NEGATIVE
Ketones, UA: NEGATIVE
Leukocytes,UA: NEGATIVE
Nitrite, UA: NEGATIVE
Protein,UA: NEGATIVE
RBC, UA: NEGATIVE
Specific Gravity, UA: 1.02 (ref 1.005–1.030)
Urobilinogen, Ur: 0.2 mg/dL (ref 0.2–1.0)
pH, UA: 5.5 (ref 5.0–7.5)

## 2022-08-20 DIAGNOSIS — L309 Dermatitis, unspecified: Secondary | ICD-10-CM | POA: Diagnosis not present

## 2022-08-20 DIAGNOSIS — Q8501 Neurofibromatosis, type 1: Secondary | ICD-10-CM | POA: Diagnosis not present

## 2022-08-20 DIAGNOSIS — Z6821 Body mass index (BMI) 21.0-21.9, adult: Secondary | ICD-10-CM | POA: Diagnosis not present

## 2022-08-20 DIAGNOSIS — C7A8 Other malignant neuroendocrine tumors: Secondary | ICD-10-CM | POA: Diagnosis not present

## 2022-08-20 DIAGNOSIS — G2581 Restless legs syndrome: Secondary | ICD-10-CM | POA: Diagnosis not present

## 2022-08-22 ENCOUNTER — Ambulatory Visit: Payer: Medicare Other

## 2022-08-26 ENCOUNTER — Encounter: Payer: Self-pay | Admitting: Urology

## 2022-08-26 NOTE — Patient Instructions (Signed)

## 2022-08-28 ENCOUNTER — Encounter: Payer: Self-pay | Admitting: Urology

## 2022-08-28 ENCOUNTER — Telehealth: Payer: Self-pay

## 2022-08-28 ENCOUNTER — Ambulatory Visit (INDEPENDENT_AMBULATORY_CARE_PROVIDER_SITE_OTHER): Payer: Medicare Other | Admitting: Urology

## 2022-08-28 DIAGNOSIS — C678 Malignant neoplasm of overlapping sites of bladder: Secondary | ICD-10-CM

## 2022-08-28 LAB — URINALYSIS, ROUTINE W REFLEX MICROSCOPIC
Bilirubin, UA: NEGATIVE
Glucose, UA: NEGATIVE
Ketones, UA: NEGATIVE
Leukocytes,UA: NEGATIVE
Nitrite, UA: NEGATIVE
Protein,UA: NEGATIVE
RBC, UA: NEGATIVE
Specific Gravity, UA: 1.02 (ref 1.005–1.030)
Urobilinogen, Ur: 0.2 mg/dL (ref 0.2–1.0)
pH, UA: 5.5 (ref 5.0–7.5)

## 2022-08-28 LAB — BLADDER SCAN AMB NON-IMAGING: Scan Result: 77

## 2022-08-28 MED ORDER — BCG LIVE 50 MG IS SUSR
3.2400 mL | Freq: Once | INTRAVESICAL | Status: AC
  Administered 2022-08-28: 81 mg via INTRAVESICAL

## 2022-08-28 NOTE — Patient Instructions (Signed)

## 2022-08-28 NOTE — Progress Notes (Addendum)
BCG Bladder Instillation  BCG # 1 of 6  Due to Bladder Cancer patient is present today for a BCG treatment. Patient was cleaned and prepped in a sterile fashion with betadine. A 14FR catheter was inserted, urine return was noted 350 ml, urine was yellow in color.  44m of reconstituted BCG was instilled into the bladder. The catheter was then removed. Patient tolerated well, no complications were noted  Performed by: Hope LPN  Follow up/ Additional notes: keep scheduled NV

## 2022-08-28 NOTE — Addendum Note (Signed)
Addended by: Iris Pert on: 08/28/2022 11:29 AM   Modules accepted: Orders

## 2022-08-28 NOTE — Progress Notes (Deleted)
BCG Bladder Instillation  BCG # 1 of 6  Due to Bladder Cancer patient is present today for a BCG treatment. Patient was cleaned and prepped in a sterile fashion with betadine. A 14FR catheter was inserted, urine return was noted 350 ml, urine was yellow in color.  73m of reconstituted BCG was instilled into the bladder. The catheter was then removed. Patient tolerated well, no complications were noted  Performed by: Ailine Hefferan LPN  Follow up/ Additional notes: keep scheduled NV

## 2022-08-28 NOTE — Progress Notes (Signed)
Patient called office stating he was going to bathroom q 15 mins and only small amounts. Patient states he also has been experiencing some discomfort while voiding. Symptoms reviewed with Dr. Patient came back into office for pvr.  Pvr noted @ 22. Side effects from BCG reiterated to patient. Patient will call office in morning if symptoms worsen.

## 2022-08-28 NOTE — Addendum Note (Signed)
Addended by: Iris Pert on: 08/28/2022 04:27 PM   Modules accepted: Orders

## 2022-08-28 NOTE — Telephone Encounter (Signed)
Patient noted to have 350 ml of urine in bladder before BCG.

## 2022-08-31 ENCOUNTER — Ambulatory Visit (HOSPITAL_COMMUNITY)
Admission: RE | Admit: 2022-08-31 | Discharge: 2022-08-31 | Disposition: A | Discharge status: Home / Self Care | Payer: Medicare Other | Source: Ambulatory Visit | Attending: Physician Assistant | Admitting: Physician Assistant

## 2022-08-31 DIAGNOSIS — N1330 Unspecified hydronephrosis: Secondary | ICD-10-CM | POA: Insufficient documentation

## 2022-09-04 ENCOUNTER — Ambulatory Visit (INDEPENDENT_AMBULATORY_CARE_PROVIDER_SITE_OTHER): Payer: Medicare Other | Admitting: Urology

## 2022-09-04 DIAGNOSIS — C678 Malignant neoplasm of overlapping sites of bladder: Secondary | ICD-10-CM | POA: Diagnosis not present

## 2022-09-04 LAB — URINALYSIS, ROUTINE W REFLEX MICROSCOPIC
Bilirubin, UA: NEGATIVE
Glucose, UA: NEGATIVE
Ketones, UA: NEGATIVE
Nitrite, UA: NEGATIVE
Specific Gravity, UA: 1.015 (ref 1.005–1.030)
Urobilinogen, Ur: 0.2 mg/dL (ref 0.2–1.0)
pH, UA: 6 (ref 5.0–7.5)

## 2022-09-04 MED ORDER — BCG LIVE 50 MG IS SUSR
3.2400 mL | Freq: Once | INTRAVESICAL | Status: AC
  Administered 2022-09-04: 81 mg via INTRAVESICAL

## 2022-09-04 NOTE — Patient Instructions (Signed)

## 2022-09-04 NOTE — Progress Notes (Signed)
BCG Bladder Instillation  BCG # 2 of 6  Due to Bladder Cancer patient is present today for a BCG treatment. Patient was cleaned and prepped in a sterile fashion with betadine. A 14FR catheter was inserted, urine return was noted 320m, urine was amber in color.  580mof reconstituted BCG was instilled into the bladder. The catheter was then removed. Patient tolerated well, no complications were noted  Performed by: Annamarie Yamaguchi LPN  Follow up/ Additional notes: keep scheduled NV

## 2022-09-11 ENCOUNTER — Ambulatory Visit (INDEPENDENT_AMBULATORY_CARE_PROVIDER_SITE_OTHER): Payer: Medicare Other | Admitting: Urology

## 2022-09-11 DIAGNOSIS — C678 Malignant neoplasm of overlapping sites of bladder: Secondary | ICD-10-CM

## 2022-09-11 LAB — URINALYSIS, ROUTINE W REFLEX MICROSCOPIC
Bilirubin, UA: NEGATIVE
Glucose, UA: NEGATIVE
Ketones, UA: NEGATIVE
Nitrite, UA: NEGATIVE
Specific Gravity, UA: 1.015 (ref 1.005–1.030)
Urobilinogen, Ur: 0.2 mg/dL (ref 0.2–1.0)
pH, UA: 5.5 (ref 5.0–7.5)

## 2022-09-11 LAB — MICROSCOPIC EXAMINATION
Epithelial Cells (non renal): NONE SEEN /hpf (ref 0–10)
RBC, Urine: NONE SEEN /hpf (ref 0–2)
WBC, UA: >30 /hpf — ABNORMAL HIGH (ref 0–5)

## 2022-09-11 MED ORDER — BCG LIVE 50 MG IS SUSR
3.2400 mL | Freq: Once | INTRAVESICAL | Status: AC
  Administered 2022-09-11: 81 mg via INTRAVESICAL

## 2022-09-11 NOTE — Progress Notes (Signed)
BCG Bladder Instillation  BCG # 4 of 6  Due to Bladder Cancer patient is present today for a BCG treatment. Patient was cleaned and prepped in a sterile fashion with betadine. A 14FR catheter was inserted, urine return was noted 445m, urine was yellow in color.  57mof reconstituted BCG was instilled into the bladder. The catheter was then removed. Patient tolerated well, no complications were noted  Performed by: Jariah Tarkowski LPN  Follow up/ Additional notes: keep scheduled nv

## 2022-09-11 NOTE — Patient Instructions (Signed)

## 2022-09-18 ENCOUNTER — Ambulatory Visit (INDEPENDENT_AMBULATORY_CARE_PROVIDER_SITE_OTHER): Payer: Medicare Other | Admitting: Urology

## 2022-09-18 DIAGNOSIS — C678 Malignant neoplasm of overlapping sites of bladder: Secondary | ICD-10-CM | POA: Diagnosis not present

## 2022-09-18 LAB — URINALYSIS, ROUTINE W REFLEX MICROSCOPIC
Bilirubin, UA: NEGATIVE
Glucose, UA: NEGATIVE
Ketones, UA: NEGATIVE
Nitrite, UA: NEGATIVE
Specific Gravity, UA: 1.015 (ref 1.005–1.030)
Urobilinogen, Ur: 0.2 mg/dL (ref 0.2–1.0)
pH, UA: 6.5 (ref 5.0–7.5)

## 2022-09-18 MED ORDER — BCG LIVE 50 MG IS SUSR
3.2400 mL | Freq: Once | INTRAVESICAL | Status: AC
  Administered 2022-09-18: 81 mg via INTRAVESICAL

## 2022-09-18 NOTE — Progress Notes (Signed)
BCG Bladder Instillation  BCG # 4 og 6  Due to Bladder Cancer patient is present today for a BCG treatment. Patient was cleaned and prepped in a sterile fashion with betadine. A 16FR catheter was inserted, urine return was noted 251m, urine was yellow in color.  571mof reconstituted BCG was instilled into the bladder. The catheter was then removed. Patient tolerated well, no complications were noted  Performed by: KoLevi AlandCMA  Follow up/ Additional notes: follow up as scheduled.

## 2022-09-27 ENCOUNTER — Ambulatory Visit: Payer: Medicare Other

## 2022-10-02 ENCOUNTER — Ambulatory Visit (INDEPENDENT_AMBULATORY_CARE_PROVIDER_SITE_OTHER): Payer: Medicare Other | Admitting: Urology

## 2022-10-02 DIAGNOSIS — C678 Malignant neoplasm of overlapping sites of bladder: Secondary | ICD-10-CM | POA: Diagnosis not present

## 2022-10-02 LAB — URINALYSIS, ROUTINE W REFLEX MICROSCOPIC
Bilirubin, UA: NEGATIVE
Glucose, UA: NEGATIVE
Ketones, UA: NEGATIVE
Nitrite, UA: NEGATIVE
Protein,UA: NEGATIVE
Specific Gravity, UA: 1.015 (ref 1.005–1.030)
Urobilinogen, Ur: 0.2 mg/dL (ref 0.2–1.0)
pH, UA: 5.5 (ref 5.0–7.5)

## 2022-10-02 MED ORDER — BCG LIVE 50 MG IS SUSR
3.2400 mL | Freq: Once | INTRAVESICAL | Status: AC
  Administered 2022-10-02: 81 mg via INTRAVESICAL

## 2022-10-02 NOTE — Progress Notes (Addendum)
BCG Bladder Instillation  BCG # 5 of 6  Due to Bladder Cancer patient is present today for a BCG treatment. Patient was cleaned and prepped in a sterile fashion with betadine. A 14FR catheter was inserted, urine return was noted 484m, urine was yellow in color.  552mof reconstituted BCG was instilled into the bladder. The catheter was then removed. Patient tolerated well, no complications were noted  Performed by: AmEstill BambergN  Follow up/ Additional notes: weekly BCG

## 2022-10-04 ENCOUNTER — Ambulatory Visit: Payer: Medicare Other

## 2022-10-10 ENCOUNTER — Ambulatory Visit (INDEPENDENT_AMBULATORY_CARE_PROVIDER_SITE_OTHER): Payer: Medicare Other | Admitting: Urology

## 2022-10-10 DIAGNOSIS — C678 Malignant neoplasm of overlapping sites of bladder: Secondary | ICD-10-CM

## 2022-10-10 LAB — URINALYSIS, ROUTINE W REFLEX MICROSCOPIC
Bilirubin, UA: NEGATIVE
Glucose, UA: NEGATIVE
Ketones, UA: NEGATIVE
Nitrite, UA: NEGATIVE
Specific Gravity, UA: 1.015 (ref 1.005–1.030)
Urobilinogen, Ur: 0.2 mg/dL (ref 0.2–1.0)
pH, UA: 5.5 (ref 5.0–7.5)

## 2022-10-10 MED ORDER — BCG LIVE 50 MG IS SUSR
3.2400 mL | Freq: Once | INTRAVESICAL | Status: AC
  Administered 2022-10-10: 81 mg via INTRAVESICAL

## 2022-10-10 NOTE — Progress Notes (Signed)
BCG Bladder Instillation  BCG # 6 of 6  Due to Bladder Cancer patient is present today for a BCG treatment. Patient was cleaned and prepped in a sterile fashion with betadine. A 14FR catheter was inserted, urine return was noted 471m, urine was yellow in color.  528mof reconstituted BCG was instilled into the bladder. The catheter was then removed. Patient tolerated well, no complications were noted  Performed by: KoLevi AlandCMA  Follow up/ Additional notes: Follow up as scheduled.

## 2022-10-11 ENCOUNTER — Ambulatory Visit: Payer: Medicare Other

## 2022-11-02 DIAGNOSIS — Q8501 Neurofibromatosis, type 1: Secondary | ICD-10-CM | POA: Diagnosis not present

## 2022-11-02 DIAGNOSIS — Z6821 Body mass index (BMI) 21.0-21.9, adult: Secondary | ICD-10-CM | POA: Diagnosis not present

## 2022-11-02 DIAGNOSIS — M25551 Pain in right hip: Secondary | ICD-10-CM | POA: Diagnosis not present

## 2022-11-21 ENCOUNTER — Ambulatory Visit (INDEPENDENT_AMBULATORY_CARE_PROVIDER_SITE_OTHER): Payer: Medicare Other | Admitting: Urology

## 2022-11-21 VITALS — BP 165/93 | HR 76

## 2022-11-21 DIAGNOSIS — C678 Malignant neoplasm of overlapping sites of bladder: Secondary | ICD-10-CM

## 2022-11-21 DIAGNOSIS — Z8551 Personal history of malignant neoplasm of bladder: Secondary | ICD-10-CM

## 2022-11-21 LAB — URINALYSIS, ROUTINE W REFLEX MICROSCOPIC
Bilirubin, UA: NEGATIVE
Glucose, UA: NEGATIVE
Ketones, UA: NEGATIVE
Nitrite, UA: NEGATIVE
Specific Gravity, UA: 1.015 (ref 1.005–1.030)
Urobilinogen, Ur: 0.2 mg/dL (ref 0.2–1.0)
pH, UA: 7 (ref 5.0–7.5)

## 2022-11-21 LAB — MICROSCOPIC EXAMINATION
RBC, Urine: >30 /hpf — AB (ref 0–2)
WBC, UA: >30 /hpf — AB (ref 0–5)

## 2022-11-21 MED ORDER — CIPROFLOXACIN HCL 500 MG PO TABS
500.0000 mg | ORAL_TABLET | Freq: Once | ORAL | Status: AC
  Administered 2022-11-21: 500 mg via ORAL

## 2022-11-21 NOTE — Progress Notes (Unsigned)
   11/21/22  CC: followup bladder CIS  HPI: Chad Butler is a 72yo here for cystoscopy after finishing BCG Blood pressure (!) 165/93, pulse 76. NED. A&Ox3.   No respiratory distress   Abd soft, NT, ND Normal phallus with bilateral descended testicles  Cystoscopy Procedure Note  Patient identification was confirmed, informed consent was obtained, and patient was prepped using Betadine solution.  Lidocaine jelly was administered per urethral meatus.     Pre-Procedure: - Inspection reveals a normal caliber ureteral meatus.  Procedure: The flexible cystoscope was introduced without difficulty - No urethral strictures/lesions are present. - Enlarged prostate  - Normal bladder neck - Bilateral ureteral orifices identified - Bladder mucosa  reveals no ulcers, tumors, or lesions - No bladder stones - No trabeculation  Retroflexion shows    Post-Procedure: - Patient tolerated the procedure well  Assessment/ Plan: Urine for cytology.  Followup in 3 months for cytology  No follow-ups on file.  [name], MD

## 2022-11-22 LAB — CYTOLOGY, URINE

## 2022-11-22 NOTE — Patient Instructions (Signed)

## 2022-12-27 ENCOUNTER — Ambulatory Visit (INDEPENDENT_AMBULATORY_CARE_PROVIDER_SITE_OTHER): Payer: Medicare Other | Admitting: Urology

## 2022-12-27 DIAGNOSIS — R350 Frequency of micturition: Secondary | ICD-10-CM | POA: Diagnosis not present

## 2022-12-27 DIAGNOSIS — R339 Retention of urine, unspecified: Secondary | ICD-10-CM | POA: Diagnosis not present

## 2022-12-27 LAB — URINALYSIS, ROUTINE W REFLEX MICROSCOPIC
Bilirubin, UA: NEGATIVE
Glucose, UA: NEGATIVE
Ketones, UA: NEGATIVE
Nitrite, UA: POSITIVE — AB
Specific Gravity, UA: 1.015 (ref 1.005–1.030)
Urobilinogen, Ur: 0.2 mg/dL (ref 0.2–1.0)
pH, UA: 6.5 (ref 5.0–7.5)

## 2022-12-27 LAB — MICROSCOPIC EXAMINATION
RBC, Urine: >30 /hpf — AB (ref 0–2)
WBC, UA: >30 /hpf — AB (ref 0–5)

## 2022-12-27 MED ORDER — NITROFURANTOIN MONOHYD MACRO 100 MG PO CAPS: 100.0000 mg | ORAL_CAPSULE | Freq: Two times a day (BID) | ORAL | 0 refills | Status: DC

## 2022-12-27 NOTE — Progress Notes (Signed)
Patient in office today complaining of urinary frequency and incontinence.  post void residual=155 Urinalysis reviewed with Dr. Alyson Ingles.  Culture pending.  Patient started on Macrobid po bid x 7 days.  Patient called and made aware.

## 2022-12-29 LAB — URINE CULTURE

## 2023-02-01 DIAGNOSIS — K117 Disturbances of salivary secretion: Secondary | ICD-10-CM | POA: Diagnosis not present

## 2023-02-01 DIAGNOSIS — J302 Other seasonal allergic rhinitis: Secondary | ICD-10-CM | POA: Diagnosis not present

## 2023-02-01 DIAGNOSIS — G2581 Restless legs syndrome: Secondary | ICD-10-CM | POA: Diagnosis not present

## 2023-02-01 DIAGNOSIS — Z6821 Body mass index (BMI) 21.0-21.9, adult: Secondary | ICD-10-CM | POA: Diagnosis not present

## 2023-02-27 ENCOUNTER — Ambulatory Visit (INDEPENDENT_AMBULATORY_CARE_PROVIDER_SITE_OTHER): Payer: Medicare Other | Admitting: Urology

## 2023-02-27 DIAGNOSIS — C678 Malignant neoplasm of overlapping sites of bladder: Secondary | ICD-10-CM | POA: Diagnosis not present

## 2023-02-27 DIAGNOSIS — Z8551 Personal history of malignant neoplasm of bladder: Secondary | ICD-10-CM | POA: Diagnosis not present

## 2023-02-27 DIAGNOSIS — R8289 Other abnormal findings on cytological and histological examination of urine: Secondary | ICD-10-CM | POA: Diagnosis not present

## 2023-02-27 LAB — MICROSCOPIC EXAMINATION: WBC, UA: >30 /hpf — AB (ref 0–5)

## 2023-02-27 LAB — URINALYSIS, ROUTINE W REFLEX MICROSCOPIC
Bilirubin, UA: NEGATIVE
Glucose, UA: NEGATIVE
Ketones, UA: NEGATIVE
Nitrite, UA: NEGATIVE
Protein,UA: NEGATIVE
Specific Gravity, UA: 1.01 (ref 1.005–1.030)
Urobilinogen, Ur: 0.2 mg/dL (ref 0.2–1.0)
pH, UA: 6 (ref 5.0–7.5)

## 2023-02-27 NOTE — Progress Notes (Unsigned)
   02/27/23  CC:  Chief Complaint  Patient presents with   FLOW RATE    HPI:  There were no vitals taken for this visit. NED. A&Ox3.   No respiratory distress   Abd soft, NT, ND Normal phallus with bilateral descended testicles  Cystoscopy Procedure Note  Patient identification was confirmed, informed consent was obtained, and patient was prepped using Betadine solution.  Lidocaine jelly was administered per urethral meatus.     Pre-Procedure: - Inspection reveals a normal caliber ureteral meatus.  Procedure: The flexible cystoscope was introduced without difficulty - No urethral strictures/lesions are present. - {Blank multiple:19197::"Enlarged","Surgically absent","Normal"} prostate *** - {Blank multiple:19197::"Normal","Elevated","Tight"} bladder neck - Bilateral ureteral orifices identified - Bladder mucosa  reveals no ulcers, tumors, or lesions - No bladder stones - No trabeculation  Retroflexion shows ***   Post-Procedure: - Patient tolerated the procedure well  Assessment/ Plan: Urine for cytology. Followup 3 months for cystoscopy and cytology  No follow-ups on file.  Wilkie Aye, MD

## 2023-02-28 ENCOUNTER — Encounter: Payer: Self-pay | Admitting: Urology

## 2023-02-28 NOTE — Patient Instructions (Signed)

## 2023-03-01 LAB — CYTOLOGY, URINE

## 2023-03-10 ENCOUNTER — Telehealth: Payer: Self-pay

## 2023-03-10 NOTE — Telephone Encounter (Signed)
-----   Message from Malen Gauze, MD sent at 03/09/2023  4:06 PM EDT ----- Please perform a FISH ----- Message ----- From: Gustavus Messing, LPN Sent: 04/08/2955  12:25 PM EDT To: Malen Gauze, MD  Please review

## 2023-03-11 ENCOUNTER — Ambulatory Visit
Admission: EM | Admit: 2023-03-11 | Discharge: 2023-03-11 | Disposition: A | Discharge status: Home / Self Care | Payer: Medicare Other | Attending: Nurse Practitioner | Admitting: Nurse Practitioner

## 2023-03-11 ENCOUNTER — Ambulatory Visit: Payer: Medicare Other

## 2023-03-11 DIAGNOSIS — C678 Malignant neoplasm of overlapping sites of bladder: Secondary | ICD-10-CM | POA: Diagnosis not present

## 2023-03-11 DIAGNOSIS — W57XXXA Bitten or stung by nonvenomous insect and other nonvenomous arthropods, initial encounter: Secondary | ICD-10-CM

## 2023-03-11 DIAGNOSIS — S20469A Insect bite (nonvenomous) of unspecified back wall of thorax, initial encounter: Secondary | ICD-10-CM

## 2023-03-11 MED ORDER — DOXYCYCLINE HYCLATE 100 MG PO CAPS: 200.0000 mg | ORAL_CAPSULE | Freq: Once | ORAL | 0 refills | Status: AC

## 2023-03-11 NOTE — Discharge Instructions (Signed)
The area on your back was a tick which was removed today.  Recommend taking the prophylactic dose of doxycycline - this has been sent to your pharmacy.  For the next 2 weeks, pay attention to any signs of tick borne disease and return if you develop symptoms including fever, new joint pain/body aches, headache, neck stiffness, and a rash around the tick bites.

## 2023-03-11 NOTE — ED Provider Notes (Signed)
[facility]    CSN: [id number] Arrival date & time: 03/11/23  1435      History   Chief Complaint No chief complaint on file.   HPI Chad Butler is a 72 y.o. male.   Patient presents today for tick bite behind right ear that his sister removed today.  Reports he wants me to check it and make sure the tick is fully been removed.  He denies fever, rash, new body aches/joint pains, headache, neck pain or stiffness.  Also has a place on his back that he wants me to check.  Reports history of neurofibromatosis and does not know if this may be a new bump popping up or if this may be something else going on.    Past Medical History:  Diagnosis Date   Anxiety    Bladder cancer (HCC)    Cancer (HCC)    Depression    Hemorrhoids    Hypertension     Patient Active Problem List   Diagnosis Date Noted   BPH with obstruction/lower urinary tract symptoms 06/20/2021   History of bladder cancer 06/20/2021   Organic impotence 06/20/2021   Hemorrhoids 04/21/2018   GERD (gastroesophageal reflux disease) 04/21/2018   Hemorrhoids 04/21/2018   Cough 10/21/2017   Chronic RLQ pain 10/21/2017   Vomiting 03/08/2017    Past Surgical History:  Procedure Laterality Date   ADRENALECTOMY     [facility]   CHOLECYSTECTOMY  2001   COLONOSCOPY  11/09/2011   Dr. Darrick Penna: two tubular adenomas, mild diverticulosis, internal hemorrhoids, surveillance in 10 years    COLONOSCOPY WITH PROPOFOL N/A 03/20/2022   Procedure: COLONOSCOPY WITH PROPOFOL;  Surgeon: Lanelle Bal, DO;  [facility];  Service: Endoscopy;  Laterality: N/A;  9:30 / ASA 2   CYST EXCISION  1970's   back   CYSTOSCOPY W/ RETROGRADES Bilateral 08/17/2021   Procedure: CYSTOSCOPY WITH RETROGRADE PYELOGRAM;  Surgeon: Malen Gauze, MD;  Location: [facility];  Service: Urology;  Laterality: Bilateral;   CYSTOSCOPY W/ RETROGRADES Left 09/07/2021   Procedure: CYSTOSCOPY WITH RETROGRADE PYELOGRAM;  Surgeon: Malen Gauze, MD;  Location: [facility];  Service: Urology;  Laterality: Left;   CYSTOSCOPY W/ RETROGRADES Left 07/26/2022   Procedure: CYSTOSCOPY WITH RETROGRADE PYELOGRAM- stent removal;  Surgeon: Malen Gauze, MD;  Location: [facility];  Service: Urology;  Laterality: Left;   CYSTOSCOPY W/ URETERAL STENT PLACEMENT Bilateral 01/18/2022   Procedure: CYSTOSCOPY WITH RETROGRADE PYELOGRAM/ BILATERAL URETERAL STENT PLACEMENT;  Surgeon: Malen Gauze, MD;  Location: [facility];  Service: Urology;  Laterality: Bilateral;   ESOPHAGOGASTRODUODENOSCOPY  10/07/2012   Dr. Lovell Sheehan: normal, Clotest negative   HERNIA REPAIR  1956   INGUINAL HERNIA REPAIR Right 11/04/2013   Procedure: RECURRENT RIGHT INGUINAL HERNIA REPAIR;  Surgeon: Dalia Heading, MD;  Location: [facility];  Service: General;  Laterality: Right;   Left shoulder surgery  2011   LESION REMOVAL N/A 11/04/2013   Procedure: EXCISION OF SKIN LESION ABDOMINAL WALL;  Surgeon: Dalia Heading, MD;  Location: [facility];  Service: General;  Laterality: N/A;   POLYPECTOMY  03/20/2022   Procedure: POLYPECTOMY;  Surgeon: Lanelle Bal, DO;  [facility];  Service: Endoscopy;;   TRANSURETHRAL RESECTION OF BLADDER TUMOR N/A 08/17/2021   Procedure: TRANSURETHRAL RESECTION OF BLADDER TUMOR (TURBT);  Surgeon: Malen Gauze, MD;  [facility];  Service: Urology;  Laterality: N/A;   TRANSURETHRAL RESECTION OF BLADDER TUMOR N/A 01/18/2022   Procedure:  TRANSURETHRAL RESECTION OF BLADDER TUMOR (TURBT);  Surgeon: Malen Gauze, MD;  [facility]: [facility];  Service: Urology;  Laterality: N/A;   URETEROSCOPY Left 09/07/2021   Procedure: URETEROSCOPY-DIAGNOSTIC;  Surgeon: Malen Gauze, MD;  Location: [facility];  Service: Urology;  Laterality: Left;       Home Medications    Prior to Admission medications   Medication Sig Start Date End Date Taking? Authorizing Provider  doxycycline (VIBRAMYCIN) 100 MG capsule Take 2 capsules (200 mg total) by  mouth once for 1 dose. 03/11/23 03/11/23 Yes Valentino Nose, NP  ALPRAZolam Prudy Feeler) 0.5 MG tablet Take 0.5 mg by mouth at bedtime. For sleep.    [provider]  aspirin EC 81 MG tablet Take 81 mg by mouth 4 (four) times a week. Swallow whole.    [provider]  Aspirin-Acetaminophen-Caffeine (GOODY HEADACHE PO) Take 1 packet by mouth daily as needed (headaches.).    [provider]  benzonatate (TESSALON) 200 MG capsule Take 200 mg by mouth daily as needed for cough. 01/08/22   [provider]  Cyanocobalamin (VITAMIN B-12 PO) Take 1 tablet by mouth 3 (three) times a week.    [provider]  diphenhydrAMINE (BENADRYL) 2 % cream Apply 1 Application topically 3 (three) times daily as needed for itching.    [provider]  docusate sodium (COLACE) 100 MG capsule Take 100 mg by mouth daily as needed for constipation.    [provider]  doxylamine, Sleep, (UNISOM) 25 MG tablet Take 12.5 mg by mouth at bedtime as needed for sleep.    [provider]  ibuprofen (ADVIL) 200 MG tablet Take 200 mg by mouth every 8 (eight) hours as needed for headache or moderate pain.    [provider]  Multiple Vitamin (MULTIVITAMIN WITH MINERALS) TABS tablet Take 1 tablet by mouth in the morning. Centrum Silver    [provider]  nitrofurantoin, macrocrystal-monohydrate, (MACROBID) 100 MG capsule Take 1 capsule (100 mg total) by mouth 2 (two) times daily. 12/27/22   McKenzie, Mardene Celeste, MD  Omega-3 Fatty Acids (FISH OIL PO) Take 2,000 mg by mouth 3 (three) times a week.    [provider]  omeprazole (PRILOSEC) 40 MG capsule Take 40 mg by mouth daily with lunch.    [provider]  ondansetron (ZOFRAN) 4 MG tablet TAKE 1 TABLET BY MOUTH EVERY 8 HOURS AS NEEDED FOR NAUSEA. 04/11/22   Lanelle Bal, DO  oxyCODONE-acetaminophen (PERCOCET) 5-325 MG tablet Take 1 tablet by mouth every 4 (four) hours as needed for  severe pain. 07/26/22 07/26/23  Malen Gauze, MD  phenazopyridine (PYRIDIUM) 100 MG tablet Take 1 tablet (100 mg total) by mouth 3 (three) times daily as needed for pain. 06/07/22   Summerlin, Regan Rakers, PA-C  rOPINIRole (REQUIP) 0.5 MG tablet Take 0.5 mg by mouth at bedtime. 10/13/21   [provider]  silodosin (RAPAFLO) 8 MG CAPS capsule Take 1 capsule (8 mg total) by mouth in the morning and at bedtime. 08/15/22   McKenzie, Mardene Celeste, MD  pantoprazole (PROTONIX) 40 MG tablet Take 1 tablet (40 mg total) by mouth daily. Take 30 minutes before breakfast. 03/08/17 02/22/20  Gelene Mink, NP    Family History Family History  Problem Relation Age of Onset   Hypertension Mother    Cancer Father    Colon cancer Neg Hx    Diabetes Neg Hx    Thyroid disease Neg Hx  Social History Social History   Tobacco Use   Smoking status: Never   Smokeless tobacco: Never  Vaping Use   Vaping Use: Never used  Substance Use Topics   Alcohol use: Yes    Comment: 2 oz wine some days   Drug use: No     Allergies   Red dye and Gadolinium   Review of Systems Review of Systems Per HPI  Physical Exam Triage Vital Signs ED Triage Vitals  Enc Vitals Group     BP 03/11/23 1537 (!) 158/89     Pulse Rate 03/11/23 1537 75     Resp 03/11/23 1537 20     Temp 03/11/23 1537 98.1 F (36.7 C)     Temp Source 03/11/23 1537 Oral     SpO2 03/11/23 1537 97 %     Weight --      Height --      Head Circumference --      Peak Flow --      Pain Score 03/11/23 1541 0     Pain Loc --      Pain Edu? --      Excl. in GC? --    No data found.  Updated Vital Signs BP (!) 158/89 (BP Location: Right Arm)   Pulse 75   Temp 98.1 F (36.7 C) (Oral)   Resp 20   SpO2 97%   Visual Acuity Right Eye Distance:   Left Eye Distance:   Bilateral Distance:    Right Eye Near:   Left Eye Near:    Bilateral Near:     Physical Exam Vitals and nursing note reviewed.  Constitutional:       General: He is not in acute distress.    Appearance: Normal appearance. He is not toxic-appearing.  HENT:     Head: Normocephalic and atraumatic.     Right Ear: External ear normal.     Left Ear: External ear normal.     Mouth/Throat:     Mouth: Mucous membranes are moist.     Pharynx: Oropharynx is clear.  Pulmonary:     Effort: Pulmonary effort is normal. No respiratory distress.  Skin:    General: Skin is warm and dry.     Coloration: Skin is not jaundiced or pale.     Findings: No erythema.          Comments: Widespread, flesh-colored nodules consistent with neurofibromatosis  Dried drainage noted to right side of scalp above ear.  No foreign body appreciated.  Attached tick to midline back in approximately area marked.  Neurological:     Mental Status: He is alert.      UC Treatments / Results  Labs (all labs ordered are listed, but only abnormal results are displayed) Labs Reviewed - No data to display  EKG   Radiology No results found.  Procedures Foreign Body Removal  Date/Time: 03/11/2023 5:34 PM  Performed by: Valentino Nose, NP Authorized by: Valentino Nose, NP   Consent:    Consent obtained:  Verbal   Consent given by:  Patient   Risks, benefits, and alternatives were discussed: yes     Risks discussed:  Pain   Alternatives discussed:  Observation Universal protocol:    Procedure explained and questions answered to patient or proxy's satisfaction: yes     Patient identity confirmed:  Verbally with patient Location:    Location:  Trunk   Trunk location:  Lower back Procedure type:    Procedure  complexity:  Simple Procedure details:    Foreign bodies recovered:  1   Intact foreign body removal: yes   Post-procedure details:    Neurovascular status: intact     Confirmation:  No additional foreign bodies on visualization   Skin closure:  None   Dressing:  Open (no dressing)   Procedure completion:  Tolerated well, no immediate  complications Comments:     Tick removed with forceps; area cleansed with alcohol before and after removal.  Patient tolerated well.  (including critical care time)  Medications Ordered in UC Medications - No data to display  Initial Impression / Assessment and Plan / UC Course  I have reviewed the triage vital signs and the nursing notes.  Pertinent labs & imaging results that were available during my care of the patient were reviewed by me and considered in my medical decision making (see chart for details).   Patient is well-appearing, afebrile, not tachycardic, not tachypneic, oxygenating well on room air.  Patient is mildly hypertensive today in urgent care.  1. Tick bite of back wall of thorax, unspecified location, initial encounter Tick removed as above Will treat with doxycycline prophylactic dose 200 mg once ER return precautions discussed with patient  The patient was given the opportunity to ask questions.  All questions answered to their satisfaction.  The patient is in agreement to this plan.    Final Clinical Impressions(s) / UC Diagnoses   Final diagnoses:  Tick bite of back wall of thorax, unspecified location, initial encounter     Discharge Instructions      The area on your back was a tick which was removed today.  Recommend taking the prophylactic dose of doxycycline - this has been sent to your pharmacy.  For the next 2 weeks, pay attention to any signs of tick borne disease and return if you develop symptoms including fever, new joint pain/body aches, headache, neck stiffness, and a rash around the tick bites.     ED Prescriptions     Medication Sig Dispense Auth. Provider   doxycycline (VIBRAMYCIN) 100 MG capsule Take 2 capsules (200 mg total) by mouth once for 1 dose. 2 capsule Valentino Nose, NP      PDMP not reviewed this encounter.   Valentino Nose, NP 03/11/23 1735

## 2023-03-11 NOTE — ED Triage Notes (Signed)
Pt reports he had a tick on his head behind his right ear x 4 days. His sister pulled it off. It was medium sized he thought it was a bump like some of the ones he has.   Pt states he wants the sore on his back looked at today.

## 2023-04-18 NOTE — Progress Notes (Signed)
Dianon  testing (Fish) Pick up 03/11/23 Tracking # [phone]

## 2023-04-24 ENCOUNTER — Encounter: Payer: Self-pay | Admitting: Urology

## 2023-05-03 DIAGNOSIS — C678 Malignant neoplasm of overlapping sites of bladder: Secondary | ICD-10-CM | POA: Diagnosis not present

## 2023-05-03 DIAGNOSIS — Z1331 Encounter for screening for depression: Secondary | ICD-10-CM | POA: Diagnosis not present

## 2023-05-03 DIAGNOSIS — Z0001 Encounter for general adult medical examination with abnormal findings: Secondary | ICD-10-CM | POA: Diagnosis not present

## 2023-05-03 DIAGNOSIS — E782 Mixed hyperlipidemia: Secondary | ICD-10-CM | POA: Diagnosis not present

## 2023-05-03 DIAGNOSIS — F419 Anxiety disorder, unspecified: Secondary | ICD-10-CM | POA: Diagnosis not present

## 2023-05-03 DIAGNOSIS — N400 Enlarged prostate without lower urinary tract symptoms: Secondary | ICD-10-CM | POA: Diagnosis not present

## 2023-05-03 DIAGNOSIS — M25559 Pain in unspecified hip: Secondary | ICD-10-CM | POA: Diagnosis not present

## 2023-05-03 DIAGNOSIS — Z6821 Body mass index (BMI) 21.0-21.9, adult: Secondary | ICD-10-CM | POA: Diagnosis not present

## 2023-05-03 DIAGNOSIS — R32 Unspecified urinary incontinence: Secondary | ICD-10-CM | POA: Diagnosis not present

## 2023-05-23 ENCOUNTER — Ambulatory Visit
Admission: EM | Admit: 2023-05-23 | Discharge: 2023-05-23 | Disposition: A | Discharge status: Home / Self Care | Payer: Medicare Other

## 2023-05-23 DIAGNOSIS — R030 Elevated blood-pressure reading, without diagnosis of hypertension: Secondary | ICD-10-CM

## 2023-05-23 NOTE — Discharge Instructions (Addendum)
Please follow-up with your primary care provider regarding the elevated blood pressure.  You can keep checking it throughout the day, check it as I instructed him.  Things that can lower blood pressure are deep breathing exercises, controlling pain, getting regular physical activity, drinking plenty of water, and avoiding added salt in the diet.  If you develop elevated blood pressure and chest pain, shortness of breath, vision changes, headache, dizziness/lightheadedness, please seek care in the emergency room immediately.

## 2023-05-23 NOTE — ED Provider Notes (Signed)
[facility]    CSN: [id number] Arrival date & time: 05/23/23  1938      History   Chief Complaint No chief complaint on file.   HPI Chad Butler is a 72 y.o. male.   Patient presents today with a couple of elevated blood pressure readings.  Reports he was seen recently for his Medicare wellness visit and blood pressure was noted to be borderline elevated.  He has been checking it a few times daily at home for the past couple of weeks and notes his blood pressure today was 1 60-1 70 on top for 2 readings in the room and came in to be seen.  He denies current chest pain, dizziness, lightheadedness, shortness of breath, vision changes, and headache.  He does not take anything for blood pressure.    Past Medical History:  Diagnosis Date   Anxiety    Bladder cancer (HCC)    Cancer (HCC)    Depression    Hemorrhoids    Hypertension     Patient Active Problem List   Diagnosis Date Noted   BPH with obstruction/lower urinary tract symptoms 06/20/2021   History of bladder cancer 06/20/2021   Organic impotence 06/20/2021   Hemorrhoids 04/21/2018   GERD (gastroesophageal reflux disease) 04/21/2018   Hemorrhoids 04/21/2018   Cough 10/21/2017   Chronic RLQ pain 10/21/2017   Vomiting 03/08/2017    Past Surgical History:  Procedure Laterality Date   ADRENALECTOMY     [facility]   CHOLECYSTECTOMY  2001   COLONOSCOPY  11/09/2011   Dr. Darrick Penna: two tubular adenomas, mild diverticulosis, internal hemorrhoids, surveillance in 10 years    COLONOSCOPY WITH PROPOFOL N/A 03/20/2022   Procedure: COLONOSCOPY WITH PROPOFOL;  Surgeon: Lanelle Bal, DO;  [facility];  Service: Endoscopy;  Laterality: N/A;  9:30 / ASA 2   CYST EXCISION  1970's   back   CYSTOSCOPY W/ RETROGRADES Bilateral 08/17/2021   Procedure: CYSTOSCOPY WITH RETROGRADE PYELOGRAM;  Surgeon: Malen Gauze, MD;  Location: [facility];  Service: Urology;  Laterality: Bilateral;   CYSTOSCOPY W/  RETROGRADES Left 09/07/2021   Procedure: CYSTOSCOPY WITH RETROGRADE PYELOGRAM;  Surgeon: Malen Gauze, MD;  Location[facility];  Service: Urology;  Laterality: Left;   CYSTOSCOPY W/ RETROGRADES Left 07/26/2022   Procedure: CYSTOSCOPY WITH RETROGRADE PYELOGRAM- stent removal;  Surgeon: Malen Gauze, MD;  [facility];  Service: Urology;  Laterality: Left;   CYSTOSCOPY W/ URETERAL STENT PLACEMENT Bilateral 01/18/2022   Procedure: CYSTOSCOPY WITH RETROGRADE PYELOGRAM/ BILATERAL URETERAL STENT PLACEMENT;  Surgeon: Malen Gauze, MD;  [facility];  Service: Urology;  Laterality: Bilateral;   ESOPHAGOGASTRODUODENOSCOPY  10/07/2012   Dr. Lovell Sheehan: normal, Clotest negative   HERNIA REPAIR  1956   INGUINAL HERNIA REPAIR Right 11/04/2013   Procedure: RECURRENT RIGHT INGUINAL HERNIA REPAIR;  Surgeon: Dalia Heading, MD;  [facility];  Service: General;  Laterality: Right;   Left shoulder surgery  2011   LESION REMOVAL N/A 11/04/2013   Procedure: EXCISION OF SKIN LESION ABDOMINAL WALL;  Surgeon: Dalia Heading, MD;  Location[facility];  Service: General;  Laterality: N/A;   POLYPECTOMY  03/20/2022   Procedure: POLYPECTOMY;  Surgeon: Lanelle Bal, DO;  [facility];  Service: Endoscopy;;   TRANSURETHRAL RESECTION OF BLADDER TUMOR N/A 08/17/2021   Procedure: TRANSURETHRAL RESECTION OF BLADDER TUMOR (TURBT);  Surgeon: Malen Gauze, MD;  [facility];  Service: Urology;  Laterality: N/A;  TRANSURETHRAL RESECTION OF BLADDER TUMOR N/A 01/18/2022   Procedure: TRANSURETHRAL RESECTION OF BLADDER TUMOR (TURBT);  Surgeon: Malen Gauze, MD;  [facility];  Service: Urology;  Laterality: N/A;   URETEROSCOPY Left 09/07/2021   Procedure: URETEROSCOPY-DIAGNOSTIC;  Surgeon: Malen Gauze, MD;  [facility];  Service: Urology;  Laterality: Left;       Home Medications    Prior to Admission medications   Medication Sig Start Date End Date  Taking? Authorizing Provider  ALPRAZolam Prudy Feeler) 0.5 MG tablet Take 0.5 mg by mouth at bedtime. For sleep.    [provider]  aspirin EC 81 MG tablet Take 81 mg by mouth 4 (four) times a week. Swallow whole.    [provider]  Aspirin-Acetaminophen-Caffeine (GOODY HEADACHE PO) Take 1 packet by mouth daily as needed (headaches.).    [provider]  benzonatate (TESSALON) 200 MG capsule Take 200 mg by mouth daily as needed for cough. 01/08/22   [provider]  Cyanocobalamin (VITAMIN B-12 PO) Take 1 tablet by mouth 3 (three) times a week.    [provider]  diphenhydrAMINE (BENADRYL) 2 % cream Apply 1 Application topically 3 (three) times daily as needed for itching.    [provider]  docusate sodium (COLACE) 100 MG capsule Take 100 mg by mouth daily as needed for constipation.    [provider]  doxylamine, Sleep, (UNISOM) 25 MG tablet Take 12.5 mg by mouth at bedtime as needed for sleep.    [provider]  ibuprofen (ADVIL) 200 MG tablet Take 200 mg by mouth every 8 (eight) hours as needed for headache or moderate pain.    [provider]  Multiple Vitamin (MULTIVITAMIN WITH MINERALS) TABS tablet Take 1 tablet by mouth in the morning. Centrum Silver    [provider]  nitrofurantoin, macrocrystal-monohydrate, (MACROBID) 100 MG capsule Take 1 capsule (100 mg total) by mouth 2 (two) times daily. 12/27/22   McKenzie, Mardene Celeste, MD  Omega-3 Fatty Acids (FISH OIL PO) Take 2,000 mg by mouth 3 (three) times a week.    [provider]  omeprazole (PRILOSEC) 40 MG capsule Take 40 mg by mouth daily with lunch.    [provider]  ondansetron (ZOFRAN) 4 MG tablet TAKE 1 TABLET BY MOUTH EVERY 8 HOURS AS NEEDED FOR NAUSEA. 04/11/22   Lanelle Bal, DO  oxyCODONE-acetaminophen (PERCOCET) 5-325 MG tablet Take 1 tablet by mouth every 4 (four) hours as needed for severe pain. 07/26/22 07/26/23   Malen Gauze, MD  phenazopyridine (PYRIDIUM) 100 MG tablet Take 1 tablet (100 mg total) by mouth 3 (three) times daily as needed for pain. 06/07/22   Summerlin, Regan Rakers, PA-C  rOPINIRole (REQUIP) 0.5 MG tablet Take 0.5 mg by mouth at bedtime. 10/13/21   [provider]  silodosin (RAPAFLO) 8 MG CAPS capsule Take 1 capsule (8 mg total) by mouth in the morning and at bedtime. 08/15/22   McKenzie, Mardene Celeste, MD  pantoprazole (PROTONIX) 40 MG tablet Take 1 tablet (40 mg total) by mouth daily. Take 30 minutes before breakfast. 03/08/17 02/22/20  Gelene Mink, NP    Family History Family History  Problem Relation Age of Onset   Hypertension Mother    Cancer Father    Colon cancer Neg Hx    Diabetes Neg Hx    Thyroid disease Neg Hx     Social History Social History   Tobacco Use   Smoking status: Never  Smokeless tobacco: Never  Vaping Use   Vaping status: Never Used  Substance Use Topics   Alcohol use: Yes    Comment: 2 oz wine some days   Drug use: No     Allergies   Red dye #40 (allura red) and Gadolinium   Review of Systems Review of Systems Per HPI  Physical Exam Triage Vital Signs ED Triage Vitals  Encounter Vitals Group     BP 05/23/23 1943 (!) 207/85     Systolic BP Percentile --      Diastolic BP Percentile --      Pulse Rate 05/23/23 1943 65     Resp 05/23/23 1943 20     Temp 05/23/23 1943 97.8 F (36.6 C)     Temp Source 05/23/23 1943 Oral     SpO2 05/23/23 1943 98 %     Weight --      Height --      Head Circumference --      Peak Flow --      Pain Score 05/23/23 1944 0     Pain Loc --      Pain Education --      Exclude from Growth Chart --    No data found.  Updated Vital Signs BP (!) 207/85 (BP Location: Right Arm)   Pulse 65   Temp 97.8 F (36.6 C) (Oral)   Resp 20   SpO2 98%   Repeat blood pressure: 178/83  Visual Acuity Right Eye Distance:   Left Eye Distance:   Bilateral Distance:    Right Eye Near:    Left Eye Near:    Bilateral Near:     Physical Exam Vitals and nursing note reviewed.  Constitutional:      General: He is not in acute distress.    Appearance: Normal appearance. He is not toxic-appearing.  HENT:     Mouth/Throat:     Mouth: Mucous membranes are moist.     Pharynx: Oropharynx is clear.  Eyes:     General:        Right eye: No discharge.        Left eye: No discharge.     Extraocular Movements: Extraocular movements intact.     Pupils: Pupils are equal, round, and reactive to light.  Cardiovascular:     Rate and Rhythm: Normal rate and regular rhythm.  Pulmonary:     Effort: Pulmonary effort is normal. No respiratory distress.     Breath sounds: Normal breath sounds. No wheezing, rhonchi or rales.  Skin:    General: Skin is warm and dry.     Coloration: Skin is not jaundiced or pale.     Findings: No erythema.  Neurological:     Mental Status: He is alert and oriented to person, place, and time.  Psychiatric:        Behavior: Behavior is cooperative.      UC Treatments / Results  Labs (all labs ordered are listed, but only abnormal results are displayed) Labs Reviewed - No data to display  EKG   Radiology No results found.  Procedures Procedures (including critical care time)  Medications Ordered in UC Medications - No data to display  Initial Impression / Assessment and Plan / UC Course  I have reviewed the triage vital signs and the nursing notes.  Pertinent labs & imaging results that were available during my care of the patient were reviewed by me and considered in my medical decision making (see  chart for details).   Patient is well-appearing, afebrile, not tachycardic, not tachypneic, oxygenating well on room air.  Patient is mildly hypertensive in urgent care today.  Repeat blood pressure is 30 points improved.  1. Elevated blood pressure reading in office without diagnosis of hypertension Discussed with patient that I am  uncomfortable treating blood pressure in urgent care today without consistent elevated readings; blood pressure earlier today at home was 130/80 Recommended continuing to check blood pressure at home, follow-up with PCP if blood pressure remains elevated Strict ER precautions discussed with patient Discussed modalities to lower blood pressure at home without medications in the meantime including deep breathing exercises, drinking more water, DASH diet  The patient was given the opportunity to ask questions.  All questions answered to their satisfaction.  The patient is in agreement to this plan.    Final Clinical Impressions(s) / UC Diagnoses   Final diagnoses:  Elevated blood pressure reading in office without diagnosis of hypertension     Discharge Instructions      Please follow-up with your primary care provider regarding the elevated blood pressure.  You can keep checking it throughout the day, check it as I instructed him.  Things that can lower blood pressure are deep breathing exercises, controlling pain, getting regular physical activity, drinking plenty of water, and avoiding added salt in the diet.  If you develop elevated blood pressure and chest pain, shortness of breath, vision changes, headache, dizziness/lightheadedness, please seek care in the emergency room immediately.    ED Prescriptions   None    PDMP not reviewed this encounter.   Valentino Nose, NP 05/23/23 1958

## 2023-05-23 NOTE — ED Triage Notes (Signed)
Pt reports he has checked his BP twice today and it was elevated.   Denies headache, dizziness, blurred vision, or weakness.  States for a month he has been getting a heavy feeling in his chest .

## 2023-05-29 ENCOUNTER — Ambulatory Visit (INDEPENDENT_AMBULATORY_CARE_PROVIDER_SITE_OTHER): Payer: Medicare Other | Admitting: Urology

## 2023-05-29 VITALS — BP 168/78 | HR 81

## 2023-05-29 DIAGNOSIS — R339 Retention of urine, unspecified: Secondary | ICD-10-CM | POA: Diagnosis not present

## 2023-05-29 DIAGNOSIS — Z8551 Personal history of malignant neoplasm of bladder: Secondary | ICD-10-CM | POA: Diagnosis not present

## 2023-05-29 DIAGNOSIS — C678 Malignant neoplasm of overlapping sites of bladder: Secondary | ICD-10-CM

## 2023-05-29 DIAGNOSIS — N401 Enlarged prostate with lower urinary tract symptoms: Secondary | ICD-10-CM

## 2023-05-29 DIAGNOSIS — N138 Other obstructive and reflux uropathy: Secondary | ICD-10-CM

## 2023-05-29 LAB — URINALYSIS, ROUTINE W REFLEX MICROSCOPIC
Bilirubin, UA: NEGATIVE
Glucose, UA: NEGATIVE
Ketones, UA: NEGATIVE
Nitrite, UA: NEGATIVE
Protein,UA: NEGATIVE
Specific Gravity, UA: 1.015 (ref 1.005–1.030)
Urobilinogen, Ur: 0.2 mg/dL (ref 0.2–1.0)
pH, UA: 6 (ref 5.0–7.5)

## 2023-05-29 LAB — BLADDER SCAN AMB NON-IMAGING: Scan Result: 81

## 2023-05-29 LAB — MICROSCOPIC EXAMINATION: WBC, UA: >30 /hpf — AB (ref 0–5)

## 2023-05-29 MED ORDER — CIPROFLOXACIN HCL 500 MG PO TABS: 500.0000 mg | ORAL_TABLET | Freq: Once | ORAL | Status: DC

## 2023-05-29 MED ORDER — GEMTESA 75 MG PO TABS: 1.0000 | ORAL_TABLET | Freq: Every day | ORAL | Status: AC

## 2023-05-29 NOTE — Progress Notes (Unsigned)
   05/29/23  CC: followup bladder cancer   HPI: Chad Butler is a 72yo here for followup for bladder cancer Blood pressure (!) 168/78, pulse 81. NED. A&Ox3.   No respiratory distress   Abd soft, NT, ND Normal phallus with bilateral descended testicles  Cystoscopy Procedure Note  Patient identification was confirmed, informed consent was obtained, and patient was prepped using Betadine solution.  Lidocaine jelly was administered per urethral meatus.     Pre-Procedure: - Inspection reveals a normal caliber ureteral meatus.  Procedure: The flexible cystoscope was introduced without difficulty - No urethral strictures/lesions are present. - Enlarged prostate  - Normal bladder neck - Bilateral ureteral orifices identified - Bladder mucosa  reveals no ulcers, tumors, or lesions - No bladder stones - moderate trabeculation    Post-Procedure: - Patient tolerated the procedure well  Assessment/ Plan: U[place] for FISH. Followup 3 months for cystoscopy and PSA  No follow-ups on file.  Wilkie Aye, MD

## 2023-05-29 NOTE — Progress Notes (Unsigned)
post void residual=81 

## 2023-05-30 ENCOUNTER — Encounter: Payer: Self-pay | Admitting: Urology

## 2023-05-30 ENCOUNTER — Telehealth: Payer: Self-pay

## 2023-05-30 NOTE — Patient Instructions (Signed)

## 2023-05-30 NOTE — Telephone Encounter (Signed)
Return call to [facility] pathology. Annette Stable is made aware patient had a cysto bladder wash. Bill voiced understanding

## 2023-06-20 ENCOUNTER — Encounter: Payer: Self-pay | Admitting: Urology

## 2023-07-16 DIAGNOSIS — Z23 Encounter for immunization: Secondary | ICD-10-CM | POA: Diagnosis not present

## 2023-08-07 DIAGNOSIS — H04123 Dry eye syndrome of bilateral lacrimal glands: Secondary | ICD-10-CM | POA: Diagnosis not present

## 2023-08-13 ENCOUNTER — Other Ambulatory Visit: Payer: Self-pay | Admitting: Urology

## 2023-08-13 DIAGNOSIS — N138 Other obstructive and reflux uropathy: Secondary | ICD-10-CM

## 2023-08-13 DIAGNOSIS — R339 Retention of urine, unspecified: Secondary | ICD-10-CM

## 2023-08-22 ENCOUNTER — Other Ambulatory Visit: Payer: Medicare Other

## 2023-08-22 DIAGNOSIS — N138 Other obstructive and reflux uropathy: Secondary | ICD-10-CM | POA: Diagnosis not present

## 2023-08-22 DIAGNOSIS — N401 Enlarged prostate with lower urinary tract symptoms: Secondary | ICD-10-CM | POA: Diagnosis not present

## 2023-08-23 ENCOUNTER — Telehealth: Payer: Self-pay

## 2023-08-23 LAB — PSA: Prostate Specific Ag, Serum: 6.7 ng/mL — ABNORMAL HIGH (ref 0.0–4.0)

## 2023-08-23 NOTE — Telephone Encounter (Signed)
Patient needing refill on  silodosin (RAPAFLO) 8 MG CAPS capsule    Patient has been having penis pain occasionally.    Recently has had blood in urine and thickness as well.  Please advise.

## 2023-08-26 NOTE — Telephone Encounter (Signed)
Refill sent by MD, pt is scheduled for follow up on 11/27 with Dr. Vivia Ewing, pt will just need to keep follow up for evaluation.

## 2023-08-28 ENCOUNTER — Ambulatory Visit: Payer: Medicare Other | Admitting: Urology

## 2023-08-28 VITALS — BP 158/88 | HR 74

## 2023-08-28 DIAGNOSIS — Z8551 Personal history of malignant neoplasm of bladder: Secondary | ICD-10-CM | POA: Diagnosis not present

## 2023-08-28 DIAGNOSIS — R350 Frequency of micturition: Secondary | ICD-10-CM

## 2023-08-28 DIAGNOSIS — C678 Malignant neoplasm of overlapping sites of bladder: Secondary | ICD-10-CM

## 2023-08-28 DIAGNOSIS — N138 Other obstructive and reflux uropathy: Secondary | ICD-10-CM

## 2023-08-28 DIAGNOSIS — N401 Enlarged prostate with lower urinary tract symptoms: Secondary | ICD-10-CM

## 2023-08-28 DIAGNOSIS — R972 Elevated prostate specific antigen [PSA]: Secondary | ICD-10-CM | POA: Diagnosis not present

## 2023-08-28 LAB — URINALYSIS, ROUTINE W REFLEX MICROSCOPIC
Bilirubin, UA: NEGATIVE
Glucose, UA: NEGATIVE
Ketones, UA: NEGATIVE
Nitrite, UA: NEGATIVE
Specific Gravity, UA: 1.015 (ref 1.005–1.030)
Urobilinogen, Ur: 0.2 mg/dL (ref 0.2–1.0)
pH, UA: 6 (ref 5.0–7.5)

## 2023-08-28 LAB — MICROSCOPIC EXAMINATION: WBC, UA: >30 /[HPF] — AB (ref 0–5)

## 2023-08-28 MED ORDER — CIPROFLOXACIN HCL 500 MG PO TABS
500.0000 mg | ORAL_TABLET | Freq: Once | ORAL | Status: DC
Start: 2023-08-28 — End: 2024-06-08

## 2023-08-28 NOTE — Progress Notes (Signed)
08/28/2023 11:01 AM   Chad Butler 1951/09/08 [id number]  Referring provider: Elfredia Nevins, MD [address] [place],  [place] [address]  Followup BPh and bladder cancer   HPI: Chad Butler is a 72yo here for followup for BPH and bladder cancer. IPSS 16 QOL 3 on rapaflo 8mg  BID and gemtesa. Uirne stream fair., He has intermittent straining to urinate. Nocturia 2-4x. No dysuria or hematuria.    PMH: Past Medical History:  Diagnosis Date   Anxiety    Bladder cancer (HCC)    Cancer (HCC)    Depression    Hemorrhoids    Hypertension     Surgical History: Past Surgical History:  Procedure Laterality Date   ADRENALECTOMY     [facility]   CHOLECYSTECTOMY  2001   COLONOSCOPY  11/09/2011   Dr. Darrick Penna: two tubular adenomas, mild diverticulosis, internal hemorrhoids, surveillance in 10 years    COLONOSCOPY WITH PROPOFOL N/A 03/20/2022   Procedure: COLONOSCOPY WITH PROPOFOL;  Surgeon: Lanelle Bal, DO;  [facility];  Service: Endoscopy;  Laterality: N/A;  9:30 / ASA 2   CYST EXCISION  1970's   back   CYSTOSCOPY W/ RETROGRADES Bilateral 08/17/2021   Procedure: CYSTOSCOPY WITH RETROGRADE PYELOGRAM;  Surgeon: Malen Gauze, MD;  Location: [facility];  Service: Urology;  Laterality: Bilateral;   CYSTOSCOPY W/ RETROGRADES Left 09/07/2021   Procedure: CYSTOSCOPY WITH RETROGRADE PYELOGRAM;  Surgeon: Malen Gauze, MD;  Location: [facility];  Service: Urology;  Laterality: Left;   CYSTOSCOPY W/ RETROGRADES Left 07/26/2022   Procedure: CYSTOSCOPY WITH RETROGRADE PYELOGRAM- stent removal;  Surgeon: Malen Gauze, MD;  Location: [facility];  Service: Urology;  Laterality: Left;   CYSTOSCOPY W/ URETERAL STENT PLACEMENT Bilateral 01/18/2022   Procedure: CYSTOSCOPY WITH RETROGRADE PYELOGRAM/ BILATERAL URETERAL STENT PLACEMENT;  Surgeon: Malen Gauze, MD;  Location: [facility];  Service: Urology;  Laterality: Bilateral;   ESOPHAGOGASTRODUODENOSCOPY  10/07/2012   Dr.  Lovell Sheehan: normal, Clotest negative   HERNIA REPAIR  1956   INGUINAL HERNIA REPAIR Right 11/04/2013   Procedure: RECURRENT RIGHT INGUINAL HERNIA REPAIR;  Surgeon: Dalia Heading, MD;  Location: [facility];  Service: General;  Laterality: Right;   Left shoulder surgery  2011   LESION REMOVAL N/A 11/04/2013   Procedure: EXCISION OF SKIN LESION ABDOMINAL WALL;  Surgeon: Dalia Heading, MD;  Location: [facility];  Service: General;  Laterality: N/A;   POLYPECTOMY  03/20/2022   Procedure: POLYPECTOMY;  Surgeon: Lanelle Bal, DO;  [facility];  Service: Endoscopy;;   TRANSURETHRAL RESECTION OF BLADDER TUMOR N/A 08/17/2021   Procedure: TRANSURETHRAL RESECTION OF BLADDER TUMOR (TURBT);  Surgeon: Malen Gauze, MD;  Location: [facility];  Service: Urology;  Laterality: N/A;   TRANSURETHRAL RESECTION OF BLADDER TUMOR N/A 01/18/2022   Procedure: TRANSURETHRAL RESECTION OF BLADDER TUMOR (TURBT);  Surgeon: Malen Gauze, MD;  Location: [facility];  Service: Urology;  Laterality: N/A;   URETEROSCOPY Left 09/07/2021   Procedure: URETEROSCOPY-DIAGNOSTIC;  Surgeon: Malen Gauze, MD;  Location: [facility];  Service: Urology;  Laterality: Left;    Home Medications:  Allergies as of 08/28/2023       Reactions   Red Dye #40 (allura Red) Nausea And Vomiting   Gadolinium Nausea And Vomiting    Code: VOM, with injection, no other reactions noted, Onset Date: [id number]        Medication List        Accurate as of August 28, 2023 11:01  AM. If you have any questions, ask your nurse or doctor.          ALPRAZolam 0.5 MG tablet Commonly known as: XANAX Take 0.5 mg by mouth at bedtime. For sleep.   aspirin EC 81 MG tablet Take 81 mg by mouth 4 (four) times a week. Swallow whole.   benzonatate 200 MG capsule Commonly known as: TESSALON Take 200 mg by mouth daily as needed for cough.   diphenhydrAMINE 2 % cream Commonly known as: BENADRYL Apply 1 Application topically 3 (three) times  daily as needed for itching.   docusate sodium 100 MG capsule Commonly known as: COLACE Take 100 mg by mouth daily as needed for constipation.   doxylamine (Sleep) 25 MG tablet Commonly known as: UNISOM Take 12.5 mg by mouth at bedtime as needed for sleep.   FISH OIL PO Take 2,000 mg by mouth 3 (three) times a week.   Gemtesa 75 MG Tabs Generic drug: Vibegron Take 1 tablet (75 mg total) by mouth daily.   GOODY HEADACHE PO Take 1 packet by mouth daily as needed (headaches.).   ibuprofen 200 MG tablet Commonly known as: ADVIL Take 200 mg by mouth every 8 (eight) hours as needed for headache or moderate pain.   meloxicam 15 MG tablet Commonly known as: MOBIC Take by mouth.   multivitamin with minerals Tabs tablet Take 1 tablet by mouth in the morning. Centrum Silver   nitrofurantoin (macrocrystal-monohydrate) 100 MG capsule Commonly known as: MACROBID Take 1 capsule (100 mg total) by mouth 2 (two) times daily.   omeprazole 40 MG capsule Commonly known as: PRILOSEC Take 40 mg by mouth daily with lunch.   ondansetron 4 MG tablet Commonly known as: ZOFRAN TAKE 1 TABLET BY MOUTH EVERY 8 HOURS AS NEEDED FOR NAUSEA.   phenazopyridine 100 MG tablet Commonly known as: Pyridium Take 1 tablet (100 mg total) by mouth 3 (three) times daily as needed for pain.   rOPINIRole 0.5 MG tablet Commonly known as: REQUIP Take 0.5 mg by mouth at bedtime.   silodosin 8 MG Caps capsule Commonly known as: RAPAFLO Take 1 capsule (8 mg total) by mouth in the morning and at bedtime.   VITAMIN B-12 PO Take 1 tablet by mouth 3 (three) times a week.        Allergies:  Allergies  Allergen Reactions   Red Dye #40 (Allura Red) Nausea And Vomiting   Gadolinium Nausea And Vomiting     Code: VOM, with injection, no other reactions noted, Onset Date: [id number]     Family History: Family History  Problem Relation Age of Onset   Hypertension Mother    Cancer Father    Colon cancer Neg  Hx    Diabetes Neg Hx    Thyroid disease Neg Hx     Social History:  reports that he has never smoked. He has never used smokeless tobacco. He reports current alcohol use. He reports that he does not use drugs.  ROS: All other review of systems were reviewed and are negative except what is noted above in HPI  Physical Exam: BP (!) 158/88   Pulse 74   Constitutional:  Alert and oriented, No acute distress. HEENT: [place] AT, moist mucus membranes.  Trachea midline, no masses. Cardiovascular: No clubbing, cyanosis, or edema. Respiratory: Normal respiratory effort, no increased work of breathing. GI: Abdomen is soft, nontender, nondistended, no abdominal masses GU: No CVA tenderness.  Lymph: No cervical or inguinal lymphadenopathy. Skin: No rashes, bruises  or suspicious lesions. Neurologic: Grossly intact, no focal deficits, moving all 4 extremities. Psychiatric: Normal mood and affect.  Laboratory Data: Lab Results  Component Value Date   WBC 8.0 08/16/2021   HGB 16.6 08/16/2021   HCT 48.9 08/16/2021   MCV 96.3 08/16/2021   PLT 241 08/16/2021    Lab Results  Component Value Date   CREATININE 1.19 12/15/2021    No results found for: "PSA"  No results found for: "TESTOSTERONE"  No results found for: "HGBA1C"  Urinalysis    Component Value Date/Time   COLORURINE STRAW (A) 07/07/2021 1047   APPEARANCEUR Cloudy (A) 05/29/2023 0955   LABSPEC 1.002 (L) 07/07/2021 1047   PHURINE 7.0 07/07/2021 1047   GLUCOSEU Negative 05/29/2023 0955   HGBUR LARGE (A) 07/07/2021 1047   BILIRUBINUR Negative 05/29/2023 0955   KETONESUR negative 03/23/2022 1659   KETONESUR NEGATIVE 07/07/2021 1047   PROTEINUR Negative 05/29/2023 0955   PROTEINUR NEGATIVE 07/07/2021 1047   UROBILINOGEN 0.2 03/23/2022 1659   UROBILINOGEN 2.0 (H) 11/04/2009 1425   NITRITE Negative 05/29/2023 0955   NITRITE NEGATIVE 07/07/2021 1047   LEUKOCYTESUR 2+ (A) 05/29/2023 0955   LEUKOCYTESUR TRACE (A) 07/07/2021  1047    Lab Results  Component Value Date   LABMICR See below: 05/29/2023   WBCUA >30 (A) 05/29/2023   LABEPIT 0-10 05/29/2023   MUCUS Present 05/22/2022   BACTERIA Moderate (A) 05/29/2023    Pertinent Imaging:  Results for orders placed during the hospital encounter of 01/14/04  DG Abd 1 View  Narrative Clinical Data: Bladder calculi. ABDOMEN ONE VIEW CT scan on 03/11/03 raised the question of possible bladder calculi versus sessile bladder mass with calcification. Metallic clips, right upper quadrant secondary to cholecystectomy. Unremarkable bowel gas pattern. No definite urinary tract calcifications. There is small amount of stool in the rectum precluding optimal evaluation for bladder calculi. IMPRESSION Unremarkable plain film of the abdomen. See comments above.  Provider: Kyra Searles, Maurice March Ischen  No results found for this or any previous visit.  No results found for this or any previous visit.  No results found for this or any previous visit.  Results for orders placed during the hospital encounter of 08/31/22  Ultrasound renal complete  Narrative CLINICAL DATA:  Follow up hydronephrosis  EXAM: RENAL / URINARY TRACT ULTRASOUND COMPLETE  COMPARISON:  04/16/2022.  FINDINGS: Right kidney is 12.9 cm and left kidney 11.5 cm. There is severe bilateral hydronephrosis. Ureteral stents described previously are not visualized on today's study. No renal parenchymal abnormalities are identified.  The urinary bladder demonstrates irregular wall thickening similar to the prior study. Bladder mucosal lesions can not be excluded. The postvoid residual volume of the bladder was 160 mL.  IMPRESSION: 1. Severe bilateral hydronephrosis appears unchanged. Ureteral stents described previously are not visualized on today's study. 2. Irregular urinary bladder wall thickening. A neoplastic process cannot be excluded from this study.   Electronically Signed By: Layla Maw M.D. On: 09/02/2022 14:17  No valid procedures specified. No results found for this or any previous visit.  Results for orders placed during the hospital encounter of 07/07/21  CT Renal Stone Study  Narrative CLINICAL DATA:  Hematuria and urinary frequency. History of bladder cancer.  EXAM: CT ABDOMEN AND PELVIS WITHOUT CONTRAST  TECHNIQUE: Multidetector CT imaging of the abdomen and pelvis was performed following the standard protocol without IV contrast.  COMPARISON:  CT abdomen pelvis 09/13/2016  FINDINGS: Lower chest: No acute abnormality.  Hepatobiliary: Calcified granuloma in the  right lobe. No focal hepatic lesion. Status post cholecystectomy. No biliary dilatation.  Pancreas: Diffuse severe parenchymal atrophy and fatty infiltration. The main pancreatic duct is not dilated. No peripancreatic fat stranding or fluid collection.  Spleen: Normal size.  Multiple calcified granulomas.  Adrenals/Urinary Tract: Right adrenal gland is unremarkable. Resolution of the fluid density at the left adrenal gland with interval development of calcifications, consistent with postsurgical changes. No hydronephrosis. Left prominent extrarenal pelvis with mild dilatation of the left ureter. A 0.2 cm calcified stone is noted in the posterior left aspect of the urinary bladder, just inferior to the left UVJ (series 2, image 67; series 5, image 69). Within the urinary bladder at the right UVJ, there is a 0.3 cm calcified stone (series 2, image 69; series 5, image 65). No perinephric fat stranding or fluid collection bilaterally.  Stomach/Bowel: Patulous distal esophagus. Stomach is within normal limits. Appendix appears normal. No evidence of bowel wall thickening, distention, or inflammatory changes.  Vascular/Lymphatic: Mild scattered calcific atherosclerosis of the abdominal aorta. No abdominal aortic aneurysm. No enlarged lymph nodes in the abdomen or  pelvis.  Reproductive: Prostatomegaly with mass effect on the posteroinferior aspect of the urinary bladder.  Other: No abdominal wall hernia or abnormality. No abdominopelvic ascites.  Musculoskeletal: No acute or significant osseous findings.  IMPRESSION: 1. A 0.3 cm stone is noted within the bladder at the right UVJ. An additional 0.2 cm stone is noted within the bladder, just inferior to the left UVJ. Findings may represent recently passed stones. No additional renal stones or hydronephrosis. 2. No findings of metastatic disease in the abdomen or pelvis.   Electronically Signed By: Sherron Ales M.D. On: 07/07/2021 12:24    Cystoscopy Procedure Note  Patient identification was confirmed, informed consent was obtained, and patient was prepped using Betadine solution.  Lidocaine jelly was administered per urethral meatus.     Pre-Procedure: - Inspection reveals a normal caliber ureteral meatus.  Procedure: The flexible cystoscope was introduced without difficulty - No urethral strictures/lesions are present. - Enlarged prostate  - Normal bladder neck - Bilateral ureteral orifices identified - Bladder mucosa  reveals no ulcers, tumors, or lesions - No bladder stones - moderate trabeculation   Post-Procedure: - Patient tolerated the procedure well    Assessment & Plan:    1. Malignant neoplasm of overlapping sites of bladder (HCC) Followup 3 months for cystoscopy - Urinalysis, Routine w reflex microscopic - Cystoscopy (Bedside) - ciprofloxacin (CIPRO) tablet 500 mg  2. BPH with obstruction/lower urinary tract symptoms Continue rapaflo 8mg  BID  3. Frequent urination Continue gemtesa 75mg  daily  4. Elevated PSA -followup 3 months with PSA   No follow-ups on file.  Wilkie Aye, MD  [facility] [place]

## 2023-08-31 ENCOUNTER — Encounter: Payer: Self-pay | Admitting: Urology

## 2023-08-31 NOTE — Patient Instructions (Signed)

## 2023-09-01 LAB — URINE CULTURE

## 2023-09-03 ENCOUNTER — Telehealth: Payer: Self-pay

## 2023-09-03 NOTE — Telephone Encounter (Signed)
-----   Message from Wilkie Aye sent at 09/03/2023  8:20 AM EST ----- Macrobid 100mg  BID for 7 days ----- Message ----- From: Gustavus Messing, LPN Sent: 16/10/958   2:01 PM EST To: Malen Gauze, MD  No treatment started

## 2023-09-04 MED ORDER — NITROFURANTOIN MONOHYD MACRO 100 MG PO CAPS: 100.0000 mg | ORAL_CAPSULE | Freq: Two times a day (BID) | ORAL | 0 refills | Status: DC

## 2023-09-04 NOTE — Telephone Encounter (Signed)
-----   Message from Wilkie Aye sent at 09/03/2023  8:20 AM EST ----- Macrobid 100mg  BID for 7 days ----- Message ----- From: Gustavus Messing, LPN Sent: 16/10/958   2:01 PM EST To: Malen Gauze, MD  No treatment started

## 2023-09-04 NOTE — Telephone Encounter (Signed)
Returned call to patient and all questions and concerns addressed.

## 2023-09-04 NOTE — Telephone Encounter (Signed)
Rx sent to pharmacy. Patient called and made aware.

## 2023-09-04 NOTE — Telephone Encounter (Signed)
Patient is asking when will he know the infection is gone?   He started taking medication this afternoon.  Please advise.

## 2023-11-11 DIAGNOSIS — Z20828 Contact with and (suspected) exposure to other viral communicable diseases: Secondary | ICD-10-CM | POA: Diagnosis not present

## 2023-11-11 DIAGNOSIS — U071 COVID-19: Secondary | ICD-10-CM | POA: Diagnosis not present

## 2023-11-11 DIAGNOSIS — Z6821 Body mass index (BMI) 21.0-21.9, adult: Secondary | ICD-10-CM | POA: Diagnosis not present

## 2023-11-22 DIAGNOSIS — U099 Post covid-19 condition, unspecified: Secondary | ICD-10-CM | POA: Diagnosis not present

## 2023-11-22 DIAGNOSIS — Z6821 Body mass index (BMI) 21.0-21.9, adult: Secondary | ICD-10-CM | POA: Diagnosis not present

## 2023-11-28 ENCOUNTER — Ambulatory Visit (HOSPITAL_COMMUNITY)
Admission: RE | Admit: 2023-11-28 | Discharge: 2023-11-28 | Disposition: A | Discharge status: Home / Self Care | Payer: Medicare Other | Source: Ambulatory Visit | Attending: Family Medicine | Admitting: Family Medicine

## 2023-11-28 ENCOUNTER — Other Ambulatory Visit (HOSPITAL_COMMUNITY): Payer: Self-pay | Admitting: Family Medicine

## 2023-11-28 DIAGNOSIS — R052 Subacute cough: Secondary | ICD-10-CM

## 2023-11-28 DIAGNOSIS — U099 Post covid-19 condition, unspecified: Secondary | ICD-10-CM | POA: Diagnosis not present

## 2023-11-28 DIAGNOSIS — Z6821 Body mass index (BMI) 21.0-21.9, adult: Secondary | ICD-10-CM | POA: Diagnosis not present

## 2023-11-28 DIAGNOSIS — J929 Pleural plaque without asbestos: Secondary | ICD-10-CM | POA: Diagnosis not present

## 2023-11-29 ENCOUNTER — Other Ambulatory Visit: Payer: Medicare Other

## 2023-11-29 DIAGNOSIS — R972 Elevated prostate specific antigen [PSA]: Secondary | ICD-10-CM | POA: Diagnosis not present

## 2023-11-30 LAB — PSA: Prostate Specific Ag, Serum: 6.4 ng/mL — ABNORMAL HIGH (ref 0.0–4.0)

## 2023-12-04 ENCOUNTER — Other Ambulatory Visit: Payer: Medicare Other | Admitting: Urology

## 2023-12-09 ENCOUNTER — Ambulatory Visit (INDEPENDENT_AMBULATORY_CARE_PROVIDER_SITE_OTHER): Admitting: Urology

## 2023-12-09 VITALS — BP 136/80 | HR 88

## 2023-12-09 DIAGNOSIS — N3001 Acute cystitis with hematuria: Secondary | ICD-10-CM | POA: Diagnosis not present

## 2023-12-09 DIAGNOSIS — C678 Malignant neoplasm of overlapping sites of bladder: Secondary | ICD-10-CM

## 2023-12-09 MED ORDER — NITROFURANTOIN MONOHYD MACRO 100 MG PO CAPS
100.0000 mg | ORAL_CAPSULE | Freq: Two times a day (BID) | ORAL | 0 refills | Status: DC
Start: 2023-12-09 — End: 2023-12-18

## 2023-12-09 MED ORDER — CIPROFLOXACIN HCL 500 MG PO TABS
500.0000 mg | ORAL_TABLET | Freq: Once | ORAL | Status: DC
Start: 2023-12-09 — End: 2023-12-09

## 2023-12-09 NOTE — Progress Notes (Unsigned)
 12/09/2023 1:35 PM   Chad Butler 1951/02/01 [id number]  Referring provider: Elfredia Nevins, MD [address] [place],  [place] [address]  No chief complaint on file.   HPI: [name] is a 72yo here for evaluation of dysuria and gross hematuria. He has been having worsening urinary urgency, frequency and intermittent gross hematuria. He has intermittent suprapubic pain   PMH: Past Medical History:  Diagnosis Date   Anxiety    Bladder cancer (HCC)    Cancer (HCC)    Depression    Hemorrhoids    Hypertension     Surgical History: Past Surgical History:  Procedure Laterality Date   ADRENALECTOMY     [facility]   CHOLECYSTECTOMY  2001   COLONOSCOPY  11/09/2011   Dr. Darrick Butler: two tubular adenomas, mild diverticulosis, internal hemorrhoids, surveillance in 10 years    COLONOSCOPY WITH PROPOFOL N/A 03/20/2022   Procedure: COLONOSCOPY WITH PROPOFOL;  Surgeon: Chad Bal, DO;  [facility];  Service: Endoscopy;  Laterality: N/A;  9:30 / ASA 2   CYST EXCISION  1970's   back   CYSTOSCOPY W/ RETROGRADES Bilateral 08/17/2021   Procedure: CYSTOSCOPY WITH RETROGRADE PYELOGRAM;  Surgeon: Chad Gauze, MD;  Location: [facility];  Service: Urology;  Laterality: Bilateral;   CYSTOSCOPY W/ RETROGRADES Left 09/07/2021   Procedure: CYSTOSCOPY WITH RETROGRADE PYELOGRAM;  Surgeon: Chad Gauze, MD;  Location: [facility];  Service: Urology;  Laterality: Left;   CYSTOSCOPY W/ RETROGRADES Left 07/26/2022   Procedure: CYSTOSCOPY WITH RETROGRADE PYELOGRAM- stent removal;  Surgeon: Chad Gauze, MD;  Location: [facility];  Service: Urology;  Laterality: Left;   CYSTOSCOPY W/ URETERAL STENT PLACEMENT Bilateral 01/18/2022   Procedure: CYSTOSCOPY WITH RETROGRADE PYELOGRAM/ BILATERAL URETERAL STENT PLACEMENT;  Surgeon: Chad Gauze, MD;  Location: [facility];  Service: Urology;  Laterality: Bilateral;   ESOPHAGOGASTRODUODENOSCOPY  10/07/2012   Dr. Lovell Butler: normal, Clotest  negative   HERNIA REPAIR  1956   INGUINAL HERNIA REPAIR Right 11/04/2013   Procedure: RECURRENT RIGHT INGUINAL HERNIA REPAIR;  Surgeon: Chad Heading, MD;  Location: [facility];  Service: General;  Laterality: Right;   Left shoulder surgery  2011   LESION REMOVAL N/A 11/04/2013   Procedure: EXCISION OF SKIN LESION ABDOMINAL WALL;  Surgeon: Chad Heading, MD;  Location: [facility];  Service: General;  Laterality: N/A;   POLYPECTOMY  03/20/2022   Procedure: POLYPECTOMY;  Surgeon: Chad Bal, DO;  [facility];  Service: Endoscopy;;   TRANSURETHRAL RESECTION OF BLADDER TUMOR N/A 08/17/2021   Procedure: TRANSURETHRAL RESECTION OF BLADDER TUMOR (TURBT);  Surgeon: Chad Gauze, MD;  Location: [facility];  Service: Urology;  Laterality: N/A;   TRANSURETHRAL RESECTION OF BLADDER TUMOR N/A 01/18/2022   Procedure: TRANSURETHRAL RESECTION OF BLADDER TUMOR (TURBT);  Surgeon: Chad Gauze, MD;  Location: [facility];  Service: Urology;  Laterality: N/A;   URETEROSCOPY Left 09/07/2021   Procedure: URETEROSCOPY-DIAGNOSTIC;  Surgeon: Chad Gauze, MD;  Location: [facility];  Service: Urology;  Laterality: Left;    Home Medications:  Allergies as of 12/09/2023       Reactions   Red Dye #40 (allura Red) Nausea And Vomiting   Gadolinium Nausea And Vomiting    Code: VOM, with injection, no other reactions noted, Onset Date: [id number]        Medication List        Accurate as of December 09, 2023  1:35 PM. If you have any questions, ask your  nurse or doctor.          ALPRAZolam 0.5 MG tablet Commonly known as: XANAX Take 0.5 mg by mouth at bedtime. For sleep.   aspirin EC 81 MG tablet Take 81 mg by mouth 4 (four) times a week. Swallow whole.   benzonatate 200 MG capsule Commonly known as: TESSALON Take 200 mg by mouth daily as needed for cough.   diphenhydrAMINE 2 % cream Commonly known as: BENADRYL Apply 1 Application topically 3 (three) times daily as needed for itching.    docusate sodium 100 MG capsule Commonly known as: COLACE Take 100 mg by mouth daily as needed for constipation.   doxylamine (Sleep) 25 MG tablet Commonly known as: UNISOM Take 12.5 mg by mouth at bedtime as needed for sleep.   FISH OIL PO Take 2,000 mg by mouth 3 (three) times a week.   Gemtesa 75 MG Tabs Generic drug: Vibegron Take 1 tablet (75 mg total) by mouth daily.   GOODY HEADACHE PO Take 1 packet by mouth daily as needed (headaches.).   ibuprofen 200 MG tablet Commonly known as: ADVIL Take 200 mg by mouth every 8 (eight) hours as needed for headache or moderate pain.   meloxicam 15 MG tablet Commonly known as: MOBIC Take by mouth.   multivitamin with minerals Tabs tablet Take 1 tablet by mouth in the morning. Centrum Silver   nitrofurantoin (macrocrystal-monohydrate) 100 MG capsule Commonly known as: MACROBID Take 1 capsule (100 mg total) by mouth 2 (two) times daily.   nitrofurantoin (macrocrystal-monohydrate) 100 MG capsule Commonly known as: MACROBID Take 1 capsule (100 mg total) by mouth 2 (two) times daily.   omeprazole 40 MG capsule Commonly known as: PRILOSEC Take 40 mg by mouth daily with lunch.   ondansetron 4 MG tablet Commonly known as: ZOFRAN TAKE 1 TABLET BY MOUTH EVERY 8 HOURS AS NEEDED FOR NAUSEA.   phenazopyridine 100 MG tablet Commonly known as: Pyridium Take 1 tablet (100 mg total) by mouth 3 (three) times daily as needed for pain.   rOPINIRole 0.5 MG tablet Commonly known as: REQUIP Take 0.5 mg by mouth at bedtime.   silodosin 8 MG Caps capsule Commonly known as: RAPAFLO Take 1 capsule (8 mg total) by mouth in the morning and at bedtime.   VITAMIN B-12 PO Take 1 tablet by mouth 3 (three) times a week.        Allergies:  Allergies  Allergen Reactions   Red Dye #40 (Allura Red) Nausea And Vomiting   Gadolinium Nausea And Vomiting     Code: VOM, with injection, no other reactions noted, Onset Date: [id number]     Family  History: Family History  Problem Relation Age of Onset   Hypertension Mother    Cancer Father    Colon cancer Neg Hx    Diabetes Neg Hx    Thyroid disease Neg Hx     Social History:  reports that he has never smoked. He has never used smokeless tobacco. He reports current alcohol use. He reports that he does not use drugs.  ROS: All other review of systems were reviewed and are negative except what is noted above in HPI  Physical Exam: BP 136/80   Pulse 88   Constitutional:  Alert and oriented, No acute distress. HEENT: [place] AT, moist mucus membranes.  Trachea midline, no masses. Cardiovascular: No clubbing, cyanosis, or edema. Respiratory: Normal respiratory effort, no increased work of breathing. GI: Abdomen is soft, nontender, nondistended, no abdominal masses GU:  No CVA tenderness.  Lymph: No cervical or inguinal lymphadenopathy. Skin: No rashes, bruises or suspicious lesions. Neurologic: Grossly intact, no focal deficits, moving all 4 extremities. Psychiatric: Normal mood and affect.  Laboratory Data: Lab Results  Component Value Date   WBC 8.0 08/16/2021   HGB 16.6 08/16/2021   HCT 48.9 08/16/2021   MCV 96.3 08/16/2021   PLT 241 08/16/2021    Lab Results  Component Value Date   CREATININE 1.19 12/15/2021    No results found for: "PSA"  No results found for: "TESTOSTERONE"  No results found for: "HGBA1C"  Urinalysis    Component Value Date/Time   COLORURINE STRAW (A) 07/07/2021 1047   APPEARANCEUR Cloudy (A) 08/28/2023 1035   LABSPEC 1.002 (L) 07/07/2021 1047   PHURINE 7.0 07/07/2021 1047   GLUCOSEU Negative 08/28/2023 1035   HGBUR LARGE (A) 07/07/2021 1047   BILIRUBINUR Negative 08/28/2023 1035   KETONESUR negative 03/23/2022 1659   KETONESUR NEGATIVE 07/07/2021 1047   PROTEINUR Trace 08/28/2023 1035   PROTEINUR NEGATIVE 07/07/2021 1047   UROBILINOGEN 0.2 03/23/2022 1659   UROBILINOGEN 2.0 (H) 11/04/2009 1425   NITRITE Negative 08/28/2023 1035    NITRITE NEGATIVE 07/07/2021 1047   LEUKOCYTESUR 3+ (A) 08/28/2023 1035   LEUKOCYTESUR TRACE (A) 07/07/2021 1047    Lab Results  Component Value Date   LABMICR See below: 08/28/2023   WBCUA >30 (A) 08/28/2023   LABEPIT 0-10 08/28/2023   MUCUS Present 05/22/2022   BACTERIA Many (A) 08/28/2023    Pertinent Imaging: *** Results for orders placed during the hospital encounter of 01/14/04  DG Abd 1 View  Narrative Clinical Data: Bladder calculi. ABDOMEN ONE VIEW CT scan on 03/11/03 raised the question of possible bladder calculi versus sessile bladder mass with calcification. Metallic clips, right upper quadrant secondary to cholecystectomy. Unremarkable bowel gas pattern. No definite urinary tract calcifications. There is small amount of stool in the rectum precluding optimal evaluation for bladder calculi. IMPRESSION Unremarkable plain film of the abdomen. See comments above.  Provider: Kyra Searles, Maurice March Ischen  No results found for this or any previous visit.  No results found for this or any previous visit.  No results found for this or any previous visit.  Results for orders placed during the hospital encounter of 08/31/22  Ultrasound renal complete  Narrative CLINICAL DATA:  Follow up hydronephrosis  EXAM: RENAL / URINARY TRACT ULTRASOUND COMPLETE  COMPARISON:  04/16/2022.  FINDINGS: Right kidney is 12.9 cm and left kidney 11.5 cm. There is severe bilateral hydronephrosis. Ureteral stents described previously are not visualized on today's study. No renal parenchymal abnormalities are identified.  The urinary bladder demonstrates irregular wall thickening similar to the prior study. Bladder mucosal lesions can not be excluded. The postvoid residual volume of the bladder was 160 mL.  IMPRESSION: 1. Severe bilateral hydronephrosis appears unchanged. Ureteral stents described previously are not visualized on today's study. 2. Irregular urinary bladder wall  thickening. A neoplastic process cannot be excluded from this study.   Electronically Signed By: Layla Maw M.D. On: 09/02/2022 14:17  No results found for this or any previous visit.  No results found for this or any previous visit.  Results for orders placed during the hospital encounter of 07/07/21  CT Renal Stone Study  Narrative CLINICAL DATA:  Hematuria and urinary frequency. History of bladder cancer.  EXAM: CT ABDOMEN AND PELVIS WITHOUT CONTRAST  TECHNIQUE: Multidetector CT imaging of the abdomen and pelvis was performed following the standard protocol without IV contrast.  COMPARISON:  CT abdomen pelvis 09/13/2016  FINDINGS: Lower chest: No acute abnormality.  Hepatobiliary: Calcified granuloma in the right lobe. No focal hepatic lesion. Status post cholecystectomy. No biliary dilatation.  Pancreas: Diffuse severe parenchymal atrophy and fatty infiltration. The main pancreatic duct is not dilated. No peripancreatic fat stranding or fluid collection.  Spleen: Normal size.  Multiple calcified granulomas.  Adrenals/Urinary Tract: Right adrenal gland is unremarkable. Resolution of the fluid density at the left adrenal gland with interval development of calcifications, consistent with postsurgical changes. No hydronephrosis. Left prominent extrarenal pelvis with mild dilatation of the left ureter. A 0.2 cm calcified stone is noted in the posterior left aspect of the urinary bladder, just inferior to the left UVJ (series 2, image 67; series 5, image 69). Within the urinary bladder at the right UVJ, there is a 0.3 cm calcified stone (series 2, image 69; series 5, image 65). No perinephric fat stranding or fluid collection bilaterally.  Stomach/Bowel: Patulous distal esophagus. Stomach is within normal limits. Appendix appears normal. No evidence of bowel wall thickening, distention, or inflammatory changes.  Vascular/Lymphatic: Mild scattered calcific  atherosclerosis of the abdominal aorta. No abdominal aortic aneurysm. No enlarged lymph nodes in the abdomen or pelvis.  Reproductive: Prostatomegaly with mass effect on the posteroinferior aspect of the urinary bladder.  Other: No abdominal wall hernia or abnormality. No abdominopelvic ascites.  Musculoskeletal: No acute or significant osseous findings.  IMPRESSION: 1. A 0.3 cm stone is noted within the bladder at the right UVJ. An additional 0.2 cm stone is noted within the bladder, just inferior to the left UVJ. Findings may represent recently passed stones. No additional renal stones or hydronephrosis. 2. No findings of metastatic disease in the abdomen or pelvis.   Electronically Signed By: Sherron Ales M.D. On: 07/07/2021 12:24   Assessment & Plan:    1.  Acute cystitis with hematuria ***   No follow-ups on file.  Wilkie Aye, MD  [facility] [place]

## 2023-12-10 ENCOUNTER — Encounter: Payer: Self-pay | Admitting: Urology

## 2023-12-10 LAB — URINALYSIS, ROUTINE W REFLEX MICROSCOPIC
Bilirubin, UA: NEGATIVE
Glucose, UA: NEGATIVE
Ketones, UA: NEGATIVE
Nitrite, UA: NEGATIVE
Specific Gravity, UA: 1.02 (ref 1.005–1.030)
Urobilinogen, Ur: 0.2 mg/dL (ref 0.2–1.0)
pH, UA: 6 (ref 5.0–7.5)

## 2023-12-10 LAB — MICROSCOPIC EXAMINATION: WBC, UA: >30 /HPF — AB (ref 0–5)

## 2023-12-10 NOTE — Patient Instructions (Signed)
 Urinary Tract Infection, Male A urinary tract infection (UTI) is an infection in your urinary tract. The urinary tract is made up of the organs that make, store, and get rid of pee (urine) in your body. These organs include: The kidneys. The ureters. The bladder. The urethra. What are the causes? Most UTIs are caused by germs called bacteria. They may be in or near your genitals. These germs grow and cause swelling in your urinary tract. What increases the risk? You're more likely to get a UTI if: You have a soft tube called a catheter that drains your pee. You can't control when you pee or poop. You have trouble peeing because of: A prostate that's bigger than normal. A kidney stone. A urinary blockage. A nerve condition that affects your bladder. Not getting enough to drink. You're sexually active. You're an older adult. You're also more likely to get a UTI if you have other health problems, such as: Diabetes. A weak immune system. Your immune system is your body's defense system. Sickle cell disease. Injury of the spine. What are the signs or symptoms? Symptoms may include: Needing to pee right away. Peeing small amounts often. Trouble getting the stream started. Pain or burning when you pee. Blood in your pee. Pee that smells bad or odd. Pain in your belly or lower back. You may also: Feel confused. This may be the first symptom in older adults. Vomit. Not feel hungry. Feel tired or easily annoyed. Have a fever or chills. How is this diagnosed? A UTI is diagnosed based on your medical history and an exam. You may also have other tests. These may include: Pee tests. Blood tests. Tests for sexually transmitted infections (STIs). If you've had more than one UTI, you may need to have imaging studies done to find out why you keep getting them. How is this treated? A UTI can be treated by: Taking antibiotics or other medicines. Drinking enough fluid to keep your pee  pale yellow. In rare cases, a UTI can cause a very bad condition called sepsis. Sepsis may be treated in the hospital. Follow these instructions at home: Medicines Take your medicines only as told by your health care provider. If you were given antibiotics, take them as told by your provider. Do not stop taking them even if you start to feel better. General instructions Make sure you: Pee often and fully. Do not hold your pee for a long time. Pee after you have sex. Contact a health care provider if: Your symptoms don't get better after 1-2 days of taking antibiotics. Your symptoms go away and then come back. You have a fever or chills. You vomit or feel like you may vomit. Get help right away if: You have very bad pain in your back or lower belly. You faint. This information is not intended to replace advice given to you by your health care provider. Make sure you discuss any questions you have with your health care provider. Document Revised: 12/21/2022 Document Reviewed: 12/21/2022 Elsevier Patient Education  2024 [employer].

## 2023-12-13 LAB — URINE CULTURE

## 2023-12-18 ENCOUNTER — Ambulatory Visit (INDEPENDENT_AMBULATORY_CARE_PROVIDER_SITE_OTHER): Admitting: Urology

## 2023-12-18 VITALS — BP 155/83 | HR 67

## 2023-12-18 DIAGNOSIS — R8289 Other abnormal findings on cytological and histological examination of urine: Secondary | ICD-10-CM | POA: Diagnosis not present

## 2023-12-18 DIAGNOSIS — C678 Malignant neoplasm of overlapping sites of bladder: Secondary | ICD-10-CM

## 2023-12-18 DIAGNOSIS — N3289 Other specified disorders of bladder: Secondary | ICD-10-CM | POA: Diagnosis not present

## 2023-12-18 DIAGNOSIS — Z8551 Personal history of malignant neoplasm of bladder: Secondary | ICD-10-CM | POA: Diagnosis not present

## 2023-12-18 LAB — MICROSCOPIC EXAMINATION: RBC, Urine: >30 /HPF — AB (ref 0–2)

## 2023-12-18 LAB — URINALYSIS, ROUTINE W REFLEX MICROSCOPIC
Bilirubin, UA: NEGATIVE
Glucose, UA: NEGATIVE
Ketones, UA: NEGATIVE
Nitrite, UA: NEGATIVE
Protein,UA: NEGATIVE
Specific Gravity, UA: 1.02 (ref 1.005–1.030)
Urobilinogen, Ur: 0.2 mg/dL (ref 0.2–1.0)
pH, UA: 6 (ref 5.0–7.5)

## 2023-12-18 MED ORDER — CIPROFLOXACIN HCL 500 MG PO TABS
500.0000 mg | ORAL_TABLET | Freq: Once | ORAL | Status: AC
Start: 2023-12-18 — End: 2023-12-18
  Administered 2023-12-18: 500 mg via ORAL

## 2023-12-18 NOTE — Progress Notes (Signed)
   12/18/23  CC: followup bladder cancer   HPI: Chad Butler is a 72yo here for followup fro bladder cancer Blood pressure (!) 155/83, pulse 67. NED. A&Ox3.   No respiratory distress   Abd soft, NT, ND Normal phallus with bilateral descended testicles  Cystoscopy Procedure Note  Patient identification was confirmed, informed consent was obtained, and patient was prepped using Betadine solution.  Lidocaine jelly was administered per urethral meatus.     Pre-Procedure: - Inspection reveals a normal caliber ureteral meatus.  Procedure: The flexible cystoscope was introduced without difficulty - No urethral strictures/lesions are present. - Enlarged prostate  - Normal bladder neck - Bilateral ureteral orifices identified - Bladder mucosa  reveals posterior wall 1cm erythematous lesion - No bladder stones - No trabeculation     Post-Procedure: - Patient tolerated the procedure well  Assessment/ Plan: Urine for cytology, will call with results  No follow-ups on file.  Wilkie Aye, MD

## 2023-12-18 NOTE — Progress Notes (Signed)
 Urine sent for [facility] cytology- tracking [phone].  Scheduled for pick up on 12/19/2023.

## 2023-12-19 LAB — CYTOLOGY, URINE

## 2023-12-24 ENCOUNTER — Encounter: Payer: Self-pay | Admitting: Urology

## 2023-12-24 NOTE — Patient Instructions (Signed)

## 2023-12-25 ENCOUNTER — Telehealth: Payer: Self-pay

## 2023-12-25 NOTE — Telephone Encounter (Signed)
 Called Pt to let him know his cytology came back inconclusive and we would like for him to do a urine drop off at his earliest convenience lab appt set for 03/27 at [facility]

## 2023-12-25 NOTE — Telephone Encounter (Signed)
 Patient needing a call back to discuss most recent lab results.  Please advise.  Call:  [phone]

## 2023-12-26 ENCOUNTER — Other Ambulatory Visit

## 2023-12-26 ENCOUNTER — Encounter: Payer: Self-pay | Admitting: Urology

## 2023-12-26 DIAGNOSIS — C678 Malignant neoplasm of overlapping sites of bladder: Secondary | ICD-10-CM | POA: Diagnosis not present

## 2023-12-26 NOTE — Telephone Encounter (Signed)
 Please see other telephone encounter.

## 2024-01-06 ENCOUNTER — Telehealth: Payer: Self-pay | Admitting: Urology

## 2024-01-06 NOTE — Telephone Encounter (Signed)
 Patient called and made aware of negative urine cytology,

## 2024-01-06 NOTE — Telephone Encounter (Signed)
 Saw urine results on mychart and wants to discuss them

## 2024-01-13 ENCOUNTER — Encounter: Payer: Self-pay | Admitting: Urology

## 2024-01-29 ENCOUNTER — Telehealth: Payer: Self-pay

## 2024-01-29 NOTE — Telephone Encounter (Signed)
 Medication prior authorization request received.  Completed PA request through cover my meds for drug silodosin . KEY: [id number]  form thumbnail   Approved: Pending

## 2024-03-25 ENCOUNTER — Ambulatory Visit: Admitting: Urology

## 2024-03-25 VITALS — BP 168/82 | HR 80

## 2024-03-25 DIAGNOSIS — R82998 Other abnormal findings in urine: Secondary | ICD-10-CM

## 2024-03-25 DIAGNOSIS — Z8551 Personal history of malignant neoplasm of bladder: Secondary | ICD-10-CM

## 2024-03-25 DIAGNOSIS — N401 Enlarged prostate with lower urinary tract symptoms: Secondary | ICD-10-CM

## 2024-03-25 DIAGNOSIS — C678 Malignant neoplasm of overlapping sites of bladder: Secondary | ICD-10-CM | POA: Diagnosis not present

## 2024-03-25 DIAGNOSIS — N3001 Acute cystitis with hematuria: Secondary | ICD-10-CM | POA: Diagnosis not present

## 2024-03-25 DIAGNOSIS — R350 Frequency of micturition: Secondary | ICD-10-CM

## 2024-03-25 LAB — MICROSCOPIC EXAMINATION
RBC, Urine: >30 /HPF — AB (ref 0–2)
WBC, UA: >30 /HPF — AB (ref 0–5)

## 2024-03-25 LAB — URINALYSIS, ROUTINE W REFLEX MICROSCOPIC
Bilirubin, UA: NEGATIVE
Glucose, UA: NEGATIVE
Ketones, UA: NEGATIVE
Nitrite, UA: NEGATIVE
Protein,UA: NEGATIVE
Specific Gravity, UA: 1.01 (ref 1.005–1.030)
Urobilinogen, Ur: 0.2 mg/dL (ref 0.2–1.0)
pH, UA: 6 (ref 5.0–7.5)

## 2024-03-25 LAB — BLADDER SCAN AMB NON-IMAGING: Scan Result: 95

## 2024-03-25 MED ORDER — NITROFURANTOIN MONOHYD MACRO 100 MG PO CAPS
100.0000 mg | ORAL_CAPSULE | Freq: Two times a day (BID) | ORAL | 0 refills | Status: AC
Start: 2024-03-25 — End: ?

## 2024-03-25 MED ORDER — CIPROFLOXACIN HCL 500 MG PO TABS: 500.0000 mg | ORAL_TABLET | Freq: Once | ORAL | Status: DC

## 2024-03-25 MED ORDER — NITROFURANTOIN MACROCRYSTAL 50 MG PO CAPS
50.0000 mg | ORAL_CAPSULE | Freq: Every day | ORAL | 11 refills | Status: DC
Start: 2024-03-25 — End: 2024-06-08

## 2024-03-25 NOTE — Progress Notes (Signed)
 03/25/2024 3:05 PM   Chad Butler 1950/10/13 [id number]  Referring provider: Bertell Satterfield, MD [address] [place],  [place] [address]  Urinary frequency  HPI: [name] is a 73yo here for followup for urinary frequency and frequent UTI. UA today is concerning for infection. He has had a couple of episodes in the past 2 weeks where he had blood tinged urine. He has worsening urinary urgency. Stream is intermittently weak. No fevers. No other complaints today   PMH: Past Medical History:  Diagnosis Date   Anxiety    Bladder cancer (HCC)    Cancer (HCC)    Depression    Hemorrhoids    Hypertension     Surgical History: Past Surgical History:  Procedure Laterality Date   ADRENALECTOMY     [facility]   CHOLECYSTECTOMY  2001   COLONOSCOPY  11/09/2011   Dr. Harvey: two tubular adenomas, mild diverticulosis, internal hemorrhoids, surveillance in 10 years    COLONOSCOPY WITH PROPOFOL  N/A 03/20/2022   Procedure: COLONOSCOPY WITH PROPOFOL ;  Surgeon: Cindie Carlin POUR, DO;  [facility];  Service: Endoscopy;  Laterality: N/A;  9:30 / ASA 2   CYST EXCISION  1970's   back   CYSTOSCOPY W/ RETROGRADES Bilateral 08/17/2021   Procedure: CYSTOSCOPY WITH RETROGRADE PYELOGRAM;  Surgeon: Sherrilee Belvie CROME, MD;  Location: [facility];  Service: Urology;  Laterality: Bilateral;   CYSTOSCOPY W/ RETROGRADES Left 09/07/2021   Procedure: CYSTOSCOPY WITH RETROGRADE PYELOGRAM;  Surgeon: Sherrilee Belvie CROME, MD;  Location: [facility];  Service: Urology;  Laterality: Left;   CYSTOSCOPY W/ RETROGRADES Left 07/26/2022   Procedure: CYSTOSCOPY WITH RETROGRADE PYELOGRAM- stent removal;  Surgeon: Sherrilee Belvie CROME, MD;  Location: [facility];  Service: Urology;  Laterality: Left;   CYSTOSCOPY W/ URETERAL STENT PLACEMENT Bilateral 01/18/2022   Procedure: CYSTOSCOPY WITH RETROGRADE PYELOGRAM/ BILATERAL URETERAL STENT PLACEMENT;  Surgeon: Sherrilee Belvie CROME, MD;  Location: [facility];  Service: Urology;   Laterality: Bilateral;   ESOPHAGOGASTRODUODENOSCOPY  10/07/2012   Dr. Mavis: normal, Clotest negative   HERNIA REPAIR  1956   INGUINAL HERNIA REPAIR Right 11/04/2013   Procedure: RECURRENT RIGHT INGUINAL HERNIA REPAIR;  Surgeon: Oneil DELENA Mavis, MD;  Location: [facility];  Service: General;  Laterality: Right;   Left shoulder surgery  2011   LESION REMOVAL N/A 11/04/2013   Procedure: EXCISION OF SKIN LESION ABDOMINAL WALL;  Surgeon: Oneil DELENA Mavis, MD;  Location: [facility];  Service: General;  Laterality: N/A;   POLYPECTOMY  03/20/2022   Procedure: POLYPECTOMY;  Surgeon: Cindie Carlin POUR, DO;  [facility];  Service: Endoscopy;;   TRANSURETHRAL RESECTION OF BLADDER TUMOR N/A 08/17/2021   Procedure: TRANSURETHRAL RESECTION OF BLADDER TUMOR (TURBT);  Surgeon: Sherrilee Belvie CROME, MD;  Location: [facility];  Service: Urology;  Laterality: N/A;   TRANSURETHRAL RESECTION OF BLADDER TUMOR N/A 01/18/2022   Procedure: TRANSURETHRAL RESECTION OF BLADDER TUMOR (TURBT);  Surgeon: Sherrilee Belvie CROME, MD;  Location: [facility];  Service: Urology;  Laterality: N/A;   URETEROSCOPY Left 09/07/2021   Procedure: URETEROSCOPY-DIAGNOSTIC;  Surgeon: Sherrilee Belvie CROME, MD;  Location: [facility];  Service: Urology;  Laterality: Left;    Home Medications:  Allergies as of 03/25/2024       Reactions   Red Dye #40 (allura Red) Nausea And Vomiting   Gadolinium Nausea And Vomiting    Code: VOM, with injection, no other reactions noted, Onset Date: 92807988        Medication List  Accurate as of March 25, 2024  3:05 PM. If you have any questions, ask your nurse or doctor.          ALPRAZolam 0.5 MG tablet Commonly known as: XANAX Take 0.5 mg by mouth at bedtime. For sleep.   aspirin  EC 81 MG tablet Take 81 mg by mouth 4 (four) times a week. Swallow whole.   benzonatate 200 MG capsule Commonly known as: TESSALON Take 200 mg by mouth daily as needed for cough.   diphenhydrAMINE 2 % cream Commonly  known as: BENADRYL Apply 1 Application topically 3 (three) times daily as needed for itching.   docusate sodium 100 MG capsule Commonly known as: COLACE Take 100 mg by mouth daily as needed for constipation.   doxylamine (Sleep) 25 MG tablet Commonly known as: UNISOM Take 12.5 mg by mouth at bedtime as needed for sleep.   FISH OIL PO Take 2,000 mg by mouth 3 (three) times a week.   Gemtesa  75 MG Tabs Generic drug: Vibegron  Take 1 tablet (75 mg total) by mouth daily.   GOODY HEADACHE PO Take 1 packet by mouth daily as needed (headaches.).   ibuprofen 200 MG tablet Commonly known as: ADVIL Take 200 mg by mouth every 8 (eight) hours as needed for headache or moderate pain.   meloxicam 15 MG tablet Commonly known as: MOBIC Take by mouth.   multivitamin with minerals Tabs tablet Take 1 tablet by mouth in the morning. Centrum Silver   omeprazole 40 MG capsule Commonly known as: PRILOSEC Take 40 mg by mouth daily with lunch.   ondansetron  4 MG tablet Commonly known as: ZOFRAN  TAKE 1 TABLET BY MOUTH EVERY 8 HOURS AS NEEDED FOR NAUSEA.   phenazopyridine  100 MG tablet Commonly known as: Pyridium  Take 1 tablet (100 mg total) by mouth 3 (three) times daily as needed for pain.   rOPINIRole 0.5 MG tablet Commonly known as: REQUIP Take 0.5 mg by mouth at bedtime.   silodosin  8 MG Caps capsule Commonly known as: RAPAFLO  Take 1 capsule (8 mg total) by mouth in the morning and at bedtime.   VITAMIN B-12 PO Take 1 tablet by mouth 3 (three) times a week.        Allergies:  Allergies  Allergen Reactions   Red Dye #40 (Allura Red) Nausea And Vomiting   Gadolinium Nausea And Vomiting     Code: VOM, with injection, no other reactions noted, Onset Date: 92807988     Family History: Family History  Problem Relation Age of Onset   Hypertension Mother    Cancer Father    Colon cancer Neg Hx    Diabetes Neg Hx    Thyroid  disease Neg Hx     Social History:  reports  that he has never smoked. He has never used smokeless tobacco. He reports current alcohol use. He reports that he does not use drugs.  ROS: All other review of systems were reviewed and are negative except what is noted above in HPI  Physical Exam: BP (!) 168/82   Pulse 80   Constitutional:  Alert and oriented, No acute distress. HEENT: [place] AT, moist mucus membranes.  Trachea midline, no masses. Cardiovascular: No clubbing, cyanosis, or edema. Respiratory: Normal respiratory effort, no increased work of breathing. GI: Abdomen is soft, nontender, nondistended, no abdominal masses GU: No CVA tenderness.  Lymph: No cervical or inguinal lymphadenopathy. Skin: No rashes, bruises or suspicious lesions. Neurologic: Grossly intact, no focal deficits, moving all 4 extremities. Psychiatric: Normal  mood and affect.  Laboratory Data: Lab Results  Component Value Date   WBC 8.0 08/16/2021   HGB 16.6 08/16/2021   HCT 48.9 08/16/2021   MCV 96.3 08/16/2021   PLT 241 08/16/2021    Lab Results  Component Value Date   CREATININE 1.19 12/15/2021    No results found for: PSA  No results found for: TESTOSTERONE  No results found for: HGBA1C  Urinalysis    Component Value Date/Time   COLORURINE STRAW (A) 07/07/2021 1047   APPEARANCEUR Clear 12/18/2023 1332   LABSPEC 1.002 (L) 07/07/2021 1047   PHURINE 7.0 07/07/2021 1047   GLUCOSEU Negative 12/18/2023 1332   HGBUR LARGE (A) 07/07/2021 1047   BILIRUBINUR Negative 12/18/2023 1332   KETONESUR negative 03/23/2022 1659   KETONESUR NEGATIVE 07/07/2021 1047   PROTEINUR Negative 12/18/2023 1332   PROTEINUR NEGATIVE 07/07/2021 1047   UROBILINOGEN 0.2 03/23/2022 1659   UROBILINOGEN 2.0 (H) 11/04/2009 1425   NITRITE Negative 12/18/2023 1332   NITRITE NEGATIVE 07/07/2021 1047   LEUKOCYTESUR 1+ (A) 12/18/2023 1332   LEUKOCYTESUR TRACE (A) 07/07/2021 1047    Lab Results  Component Value Date   LABMICR See below: 12/18/2023   WBCUA  6-10 (A) 12/18/2023   LABEPIT 0-10 12/18/2023   MUCUS Present 05/22/2022   BACTERIA Few (A) 12/18/2023    Pertinent Imaging:  Results for orders placed during the hospital encounter of 01/14/04  DG Abd 1 View  Narrative Clinical Data: Bladder calculi. ABDOMEN ONE VIEW CT scan on 03/11/03 raised the question of possible bladder calculi versus sessile bladder mass with calcification. Metallic clips, right upper quadrant secondary to cholecystectomy. Unremarkable bowel gas pattern. No definite urinary tract calcifications. There is small amount of stool in the rectum precluding optimal evaluation for bladder calculi. IMPRESSION Unremarkable plain film of the abdomen. See comments above.  Provider: Dwayne Goltz, Stuart Ischen  No results found for this or any previous visit.  No results found for this or any previous visit.  No results found for this or any previous visit.  Results for orders placed during the hospital encounter of 08/31/22  Ultrasound renal complete  Narrative CLINICAL DATA:  Follow up hydronephrosis  EXAM: RENAL / URINARY TRACT ULTRASOUND COMPLETE  COMPARISON:  04/16/2022.  FINDINGS: Right kidney is 12.9 cm and left kidney 11.5 cm. There is severe bilateral hydronephrosis. Ureteral stents described previously are not visualized on today's study. No renal parenchymal abnormalities are identified.  The urinary bladder demonstrates irregular wall thickening similar to the prior study. Bladder mucosal lesions can not be excluded. The postvoid residual volume of the bladder was 160 mL.  IMPRESSION: 1. Severe bilateral hydronephrosis appears unchanged. Ureteral stents described previously are not visualized on today's study. 2. Irregular urinary bladder wall thickening. A neoplastic process cannot be excluded from this study.   Electronically Signed By: Fonda Field M.D. On: 09/02/2022 14:17  No results found for this or any previous  visit.  No results found for this or any previous visit.  Results for orders placed during the hospital encounter of 07/07/21  CT Renal Stone Study  Narrative CLINICAL DATA:  Hematuria and urinary frequency. History of bladder cancer.  EXAM: CT ABDOMEN AND PELVIS WITHOUT CONTRAST  TECHNIQUE: Multidetector CT imaging of the abdomen and pelvis was performed following the standard protocol without IV contrast.  COMPARISON:  CT abdomen pelvis 09/13/2016  FINDINGS: Lower chest: No acute abnormality.  Hepatobiliary: Calcified granuloma in the right lobe. No focal hepatic lesion. Status post cholecystectomy. No  biliary dilatation.  Pancreas: Diffuse severe parenchymal atrophy and fatty infiltration. The main pancreatic duct is not dilated. No peripancreatic fat stranding or fluid collection.  Spleen: Normal size.  Multiple calcified granulomas.  Adrenals/Urinary Tract: Right adrenal gland is unremarkable. Resolution of the fluid density at the left adrenal gland with interval development of calcifications, consistent with postsurgical changes. No hydronephrosis. Left prominent extrarenal pelvis with mild dilatation of the left ureter. A 0.2 cm calcified stone is noted in the posterior left aspect of the urinary bladder, just inferior to the left UVJ (series 2, image 67; series 5, image 69). Within the urinary bladder at the right UVJ, there is a 0.3 cm calcified stone (series 2, image 69; series 5, image 65). No perinephric fat stranding or fluid collection bilaterally.  Stomach/Bowel: Patulous distal esophagus. Stomach is within normal limits. Appendix appears normal. No evidence of bowel wall thickening, distention, or inflammatory changes.  Vascular/Lymphatic: Mild scattered calcific atherosclerosis of the abdominal aorta. No abdominal aortic aneurysm. No enlarged lymph nodes in the abdomen or pelvis.  Reproductive: Prostatomegaly with mass effect on the  posteroinferior aspect of the urinary bladder.  Other: No abdominal wall hernia or abnormality. No abdominopelvic ascites.  Musculoskeletal: No acute or significant osseous findings.  IMPRESSION: 1. A 0.3 cm stone is noted within the bladder at the right UVJ. An additional 0.2 cm stone is noted within the bladder, just inferior to the left UVJ. Findings may represent recently passed stones. No additional renal stones or hydronephrosis. 2. No findings of metastatic disease in the abdomen or pelvis.   Electronically Signed By: Leita Mattocks M.D. On: 07/07/2021 12:24   Assessment & Plan:    1. Malignant neoplasm of overlapping sites of bladder (HCC) (Primary) Followup 1-2 weeks for cystoscopy - Urinalysis, Routine w reflex microscopic - BLADDER SCAN AMB NON-IMAGING  2. Acute cystitis with hematuria Urine for culture, will call with results We will start macrobid  100mg  BID for 7 days and then start macrodantin  50mg  at bedtime for prophylaxis     No follow-ups on file.  Belvie Clara, MD  [facility] [place]

## 2024-03-25 NOTE — Progress Notes (Signed)
 post void residual =58ml

## 2024-03-26 DIAGNOSIS — B353 Tinea pedis: Secondary | ICD-10-CM | POA: Diagnosis not present

## 2024-03-26 DIAGNOSIS — Z6821 Body mass index (BMI) 21.0-21.9, adult: Secondary | ICD-10-CM | POA: Diagnosis not present

## 2024-03-29 LAB — URINE CULTURE

## 2024-03-30 ENCOUNTER — Ambulatory Visit: Payer: Self-pay

## 2024-03-30 ENCOUNTER — Encounter: Payer: Self-pay | Admitting: Urology

## 2024-03-30 NOTE — Patient Instructions (Signed)
 Urinary Tract Infection, Male A urinary tract infection (UTI) is an infection in your urinary tract. The urinary tract is made up of the organs that make, store, and get rid of pee (urine) in your body. These organs include: The kidneys. The ureters. The bladder. The urethra. What are the causes? Most UTIs are caused by germs called bacteria. They may be in or near your genitals. These germs grow and cause swelling in your urinary tract. What increases the risk? You're more likely to get a UTI if: You have a soft tube called a catheter that drains your pee. You can't control when you pee or poop. You have trouble peeing because of: A prostate that's bigger than normal. A kidney stone. A urinary blockage. A nerve condition that affects your bladder. Not getting enough to drink. You're sexually active. You're an older adult. You're also more likely to get a UTI if you have other health problems, such as: Diabetes. A weak immune system. Your immune system is your body's defense system. Sickle cell disease. Injury of the spine. What are the signs or symptoms? Symptoms may include: Needing to pee right away. Peeing small amounts often. Trouble getting the stream started. Pain or burning when you pee. Blood in your pee. Pee that smells bad or odd. Pain in your belly or lower back. You may also: Feel confused. This may be the first symptom in older adults. Vomit. Not feel hungry. Feel tired or easily annoyed. Have a fever or chills. How is this diagnosed? A UTI is diagnosed based on your medical history and an exam. You may also have other tests. These may include: Pee tests. Blood tests. Tests for sexually transmitted infections (STIs). If you've had more than one UTI, you may need to have imaging studies done to find out why you keep getting them. How is this treated? A UTI can be treated by: Taking antibiotics or other medicines. Drinking enough fluid to keep your pee  pale yellow. In rare cases, a UTI can cause a very bad condition called sepsis. Sepsis may be treated in the hospital. Follow these instructions at home: Medicines Take your medicines only as told by your health care provider. If you were given antibiotics, take them as told by your provider. Do not stop taking them even if you start to feel better. General instructions Make sure you: Pee often and fully. Do not hold your pee for a long time. Pee after you have sex. Contact a health care provider if: Your symptoms don't get better after 1-2 days of taking antibiotics. Your symptoms go away and then come back. You have a fever or chills. You vomit or feel like you may vomit. Get help right away if: You have very bad pain in your back or lower belly. You faint. This information is not intended to replace advice given to you by your health care provider. Make sure you discuss any questions you have with your health care provider. Document Revised: 08/28/2023 Document Reviewed: 12/21/2022 Elsevier Patient Education  2025 [employer].

## 2024-04-15 ENCOUNTER — Other Ambulatory Visit: Admitting: Urology

## 2024-04-29 DIAGNOSIS — F419 Anxiety disorder, unspecified: Secondary | ICD-10-CM | POA: Diagnosis not present

## 2024-04-29 DIAGNOSIS — N400 Enlarged prostate without lower urinary tract symptoms: Secondary | ICD-10-CM | POA: Diagnosis not present

## 2024-04-29 DIAGNOSIS — Q8501 Neurofibromatosis, type 1: Secondary | ICD-10-CM | POA: Diagnosis not present

## 2024-04-29 DIAGNOSIS — F32A Depression, unspecified: Secondary | ICD-10-CM | POA: Diagnosis not present

## 2024-04-29 DIAGNOSIS — Z6822 Body mass index (BMI) 22.0-22.9, adult: Secondary | ICD-10-CM | POA: Diagnosis not present

## 2024-04-29 DIAGNOSIS — G2581 Restless legs syndrome: Secondary | ICD-10-CM | POA: Diagnosis not present

## 2024-05-05 ENCOUNTER — Telehealth: Payer: Self-pay | Admitting: Urology

## 2024-05-05 NOTE — Telephone Encounter (Signed)
 Patient wants to know if there is a medication that he can take to help him stop leaking at night time.

## 2024-05-05 NOTE — Telephone Encounter (Signed)
 FYI and advise in MD McKenzie absence

## 2024-05-07 NOTE — Telephone Encounter (Signed)
 Pt states he was taking gemtesa  however has been out so he hasn't been taking it. Pt was last seen on 03/25/2024. Okay to give pt gemtesa  samples ?

## 2024-05-07 NOTE — Telephone Encounter (Signed)
 Patient called again about getting Gemtesa  samples .

## 2024-05-07 NOTE — Telephone Encounter (Signed)
 Called pt to let pt know gemtesa  samples will be up front at the receptionist desk pt voiced his understanding

## 2024-05-07 NOTE — Telephone Encounter (Signed)
 FYI and advise in MD McKenzie absence please

## 2024-05-07 NOTE — Telephone Encounter (Signed)
 Patient checking back on having to wake up and change 2 or 3 times a night from leaking.  Please advise.  Call:  [phone]

## 2024-05-07 NOTE — Telephone Encounter (Signed)
 Called to inform pt a message was sent to NP [facility] concerning gemtesa  but as soon as she advises me I will be in contact

## 2024-06-03 DIAGNOSIS — M25559 Pain in unspecified hip: Secondary | ICD-10-CM | POA: Diagnosis not present

## 2024-06-03 DIAGNOSIS — Z1331 Encounter for screening for depression: Secondary | ICD-10-CM | POA: Diagnosis not present

## 2024-06-03 DIAGNOSIS — E559 Vitamin D deficiency, unspecified: Secondary | ICD-10-CM | POA: Diagnosis not present

## 2024-06-03 DIAGNOSIS — N400 Enlarged prostate without lower urinary tract symptoms: Secondary | ICD-10-CM | POA: Diagnosis not present

## 2024-06-03 DIAGNOSIS — Z0001 Encounter for general adult medical examination with abnormal findings: Secondary | ICD-10-CM | POA: Diagnosis not present

## 2024-06-03 DIAGNOSIS — E039 Hypothyroidism, unspecified: Secondary | ICD-10-CM | POA: Diagnosis not present

## 2024-06-03 DIAGNOSIS — Q8501 Neurofibromatosis, type 1: Secondary | ICD-10-CM | POA: Diagnosis not present

## 2024-06-03 DIAGNOSIS — Z125 Encounter for screening for malignant neoplasm of prostate: Secondary | ICD-10-CM | POA: Diagnosis not present

## 2024-06-03 DIAGNOSIS — Z9229 Personal history of other drug therapy: Secondary | ICD-10-CM | POA: Diagnosis not present

## 2024-06-03 DIAGNOSIS — Z6822 Body mass index (BMI) 22.0-22.9, adult: Secondary | ICD-10-CM | POA: Diagnosis not present

## 2024-06-03 DIAGNOSIS — E785 Hyperlipidemia, unspecified: Secondary | ICD-10-CM | POA: Diagnosis not present

## 2024-06-08 ENCOUNTER — Encounter: Payer: Self-pay | Admitting: Urology

## 2024-06-08 ENCOUNTER — Ambulatory Visit: Admitting: Urology

## 2024-06-08 VITALS — BP 161/85 | HR 96

## 2024-06-08 DIAGNOSIS — R339 Retention of urine, unspecified: Secondary | ICD-10-CM

## 2024-06-08 DIAGNOSIS — N138 Other obstructive and reflux uropathy: Secondary | ICD-10-CM

## 2024-06-08 DIAGNOSIS — C678 Malignant neoplasm of overlapping sites of bladder: Secondary | ICD-10-CM | POA: Diagnosis not present

## 2024-06-08 DIAGNOSIS — C679 Malignant neoplasm of bladder, unspecified: Secondary | ICD-10-CM | POA: Diagnosis not present

## 2024-06-08 LAB — URINALYSIS, ROUTINE W REFLEX MICROSCOPIC
Bilirubin, UA: NEGATIVE
Glucose, UA: NEGATIVE
Ketones, UA: NEGATIVE
Leukocytes,UA: NEGATIVE
Nitrite, UA: NEGATIVE
Protein,UA: NEGATIVE
Specific Gravity, UA: 1.02 (ref 1.005–1.030)
Urobilinogen, Ur: 0.2 mg/dL (ref 0.2–1.0)
pH, UA: 6 (ref 5.0–7.5)

## 2024-06-08 LAB — MICROSCOPIC EXAMINATION: Bacteria, UA: NONE SEEN

## 2024-06-08 MED ORDER — CIPROFLOXACIN HCL 500 MG PO TABS
500.0000 mg | ORAL_TABLET | Freq: Once | ORAL | Status: AC
  Administered 2024-06-08: 500 mg via ORAL

## 2024-06-08 MED ORDER — SILODOSIN 8 MG PO CAPS: 8.0000 mg | ORAL_CAPSULE | Freq: Two times a day (BID) | ORAL | 11 refills | Status: AC

## 2024-06-08 MED ORDER — MIRABEGRON ER 25 MG PO TB24: 25.0000 mg | ORAL_TABLET | Freq: Every day | ORAL | 11 refills | Status: AC

## 2024-06-08 NOTE — Progress Notes (Unsigned)
   06/08/24  CC: followup bladder cancer   HPI: Chad Butler is a 73yo here for followup for bladder cancer Blood pressure (!) 161/85, pulse 96. NED. A&Ox3.   No respiratory distress   Abd soft, NT, ND Normal phallus with bilateral descended testicles  Cystoscopy Procedure Note  Patient identification was confirmed, informed consent was obtained, and patient was prepped using Betadine solution.  Lidocaine  jelly was administered per urethral meatus.     Pre-Procedure: - Inspection reveals a normal caliber ureteral meatus.  Procedure: The flexible cystoscope was introduced without difficulty - No urethral strictures/lesions are present. - Enlarged prostate  - Normal bladder neck - Bilateral ureteral orifices identified - Bladder mucosa  reveals no ulcers, tumors, or lesions - No bladder stones - moderate trabeculation     Post-Procedure: - Patient tolerated the procedure well  Assessment/ Plan: Followup 3 months for cystoscopy  No follow-ups on file.  Belvie Clara, MD

## 2024-06-08 NOTE — Progress Notes (Unsigned)
 Bladder Scan completed today.  Patient can void prior to the bladder scan. Bladder scan result: 129  Performed By: Exie DASEN. CMA  Additional notes-

## 2024-06-09 ENCOUNTER — Encounter: Payer: Self-pay | Admitting: Urology

## 2024-06-09 LAB — CYTOLOGY, URINE

## 2024-06-09 NOTE — Patient Instructions (Signed)

## 2024-06-18 ENCOUNTER — Ambulatory Visit: Payer: Self-pay

## 2024-06-18 ENCOUNTER — Other Ambulatory Visit

## 2024-06-18 ENCOUNTER — Other Ambulatory Visit: Payer: Self-pay | Admitting: Urology

## 2024-06-18 DIAGNOSIS — C689 Malignant neoplasm of urinary organ, unspecified: Secondary | ICD-10-CM

## 2024-06-18 DIAGNOSIS — N3289 Other specified disorders of bladder: Secondary | ICD-10-CM | POA: Diagnosis not present

## 2024-06-18 NOTE — Telephone Encounter (Signed)
 Patient made aware of Dr. Little recommendation for FISH test. Patient placed on lab schedule today.

## 2024-06-18 NOTE — Telephone Encounter (Signed)
-----   Message from Belvie Clara sent at 06/18/2024  8:37 AM EDT ----- Please send a urine for [facility] ----- Message ----- From: Rebecka Memos Lab Results In Sent: 06/08/2024  10:35 AM EDT To: Belvie LITTIE Clara, MD

## 2024-06-30 LAB — UROVYSION FISH

## 2024-06-30 LAB — UROV MD:CYTOLOGY W/REFLEX FISH ATYPICAL CYTOLOGY

## 2024-07-01 LAB — BLADDER SCAN AMB NON-IMAGING: Scan Result: 129

## 2024-07-10 ENCOUNTER — Telehealth: Payer: Self-pay

## 2024-07-10 NOTE — Telephone Encounter (Signed)
-----   Message from Belvie Clara sent at 07/07/2024  1:30 PM EDT ----- negative ----- Message ----- From: Sammie Exie HERO, CMA Sent: 07/01/2024   8:24 AM EDT To: Belvie LITTIE Clara, MD; [facility], LPN  Please review.

## 2024-07-10 NOTE — Telephone Encounter (Signed)
 Called pt to give pt cytology results per MD McKenzie pt voiced his understanding

## 2024-07-15 DIAGNOSIS — M25551 Pain in right hip: Secondary | ICD-10-CM | POA: Diagnosis not present

## 2024-07-15 DIAGNOSIS — Z23 Encounter for immunization: Secondary | ICD-10-CM | POA: Diagnosis not present

## 2024-07-15 DIAGNOSIS — G2581 Restless legs syndrome: Secondary | ICD-10-CM | POA: Diagnosis not present

## 2024-08-17 DIAGNOSIS — M25551 Pain in right hip: Secondary | ICD-10-CM | POA: Diagnosis not present

## 2024-08-17 DIAGNOSIS — Z9889 Other specified postprocedural states: Secondary | ICD-10-CM | POA: Diagnosis not present

## 2024-08-17 DIAGNOSIS — Q8501 Neurofibromatosis, type 1: Secondary | ICD-10-CM | POA: Diagnosis not present

## 2024-08-17 DIAGNOSIS — Z8551 Personal history of malignant neoplasm of bladder: Secondary | ICD-10-CM | POA: Diagnosis not present

## 2024-08-17 DIAGNOSIS — Z6825 Body mass index (BMI) 25.0-25.9, adult: Secondary | ICD-10-CM | POA: Diagnosis not present

## 2024-08-17 DIAGNOSIS — R1031 Right lower quadrant pain: Secondary | ICD-10-CM | POA: Diagnosis not present

## 2024-08-17 DIAGNOSIS — Z8719 Personal history of other diseases of the digestive system: Secondary | ICD-10-CM | POA: Diagnosis not present

## 2024-09-04 ENCOUNTER — Other Ambulatory Visit: Payer: Self-pay

## 2024-09-04 ENCOUNTER — Encounter: Payer: Self-pay | Admitting: Orthopedic Surgery

## 2024-09-04 ENCOUNTER — Ambulatory Visit: Admitting: Orthopedic Surgery

## 2024-09-04 VITALS — BP 145/83 | HR 78 | Ht 72.0 in | Wt 174.0 lb

## 2024-09-04 DIAGNOSIS — M25551 Pain in right hip: Secondary | ICD-10-CM

## 2024-09-04 NOTE — Patient Instructions (Addendum)
 Hip Bursitis Rehab   Ask your health care provider which exercises are safe for you. Do exercises exactly as told by your health care provider and adjust them as directed. It is normal to feel mild stretching, pulling, tightness, or discomfort as you do these exercises. Stop right away if you feel sudden pain or your pain gets worse. Do not begin these exercises until told by your health care provider.  Stretching exercise This exercise warms up your muscles and joints and improves the movement and flexibility of your hip. This exercise also helps to relieve pain and stiffness. Iliotibial band stretch An iliotibial band is a strong band of muscle tissue that runs from the outer side of your hip to the outer side of your thigh and knee. Lie on your side with your left / right leg in the top position. Bend your left / right knee and grab your ankle. Stretch out your bottom arm to help you balance. Slowly bring your knee back so your thigh is slightly behind your body. Slowly lower your knee toward the floor until you feel a gentle stretch on the outside of your left / right thigh. If you do not feel a stretch and your knee will not lower more toward the floor, place the heel of your other foot on top of your knee and pull your knee down toward the floor with your foot. Hold this position for 10 seconds. Slowly return to the starting position. Repeat 10 times. Complete this exercise 3-4 times per week. Strengthening exercises These exercises build strength and endurance in your hip and pelvis. Endurance is the ability to use your muscles for a long time, even after they get tired. Bridge This exercise strengthens the muscles that move your thigh backward (hip extensors). Lie on your back on a firm surface with your knees bent and your feet flat on the floor. Tighten your buttocks muscles and lift your buttocks off the floor until your trunk is level with your thighs. Do not arch your back. You  should feel the muscles working in your buttocks and the back of your thighs. If you do not feel these muscles, slide your feet 1-2 inches (2.5-5 cm) farther away from your buttocks. If this exercise is too easy, try doing it with your arms crossed over your chest. Hold this position for 10 seconds. Slowly lower your hips to the starting position. Let your muscles relax completely after each repetition. Repeat 10 times. Complete this exercise 3-4 times per week Squats This exercise strengthens the muscles in front of your thigh and knee (quadriceps). Stand in front of a table, with your feet and knees pointing straight ahead. You may rest your hands on the table for balance but not for support. Slowly bend your knees and lower your hips like you are going to sit in a chair. Keep your weight over your heels, not over your toes. Keep your lower legs upright so they are parallel with the table legs. Do not let your hips go lower than your knees. Do not bend lower than told by your health care provider. If your hip pain increases, do not bend as low. Hold the squat position for 10 seconds. Slowly push with your legs to return to standing. Do not use your hands to pull yourself to standing. Repeat 10 times. Complete this exercise 3-4 times per week  Hip hike  Stand sideways on a bottom step. Stand on your left / right leg with your other  foot unsupported next to the step. You can hold on to the railing or wall for balance if needed. Keep your knees straight and your torso square. Then lift your left / right hip up toward the ceiling. Hold this position for 10 seconds. Slowly let your left / right hip lower toward the floor, past the starting position. Your foot should get closer to the floor. Do not lean or bend your knees. Repeat 10 times. Complete this exercise 3-4 times per week  Single leg stand This exercise increases your balance. Without shoes, stand near a railing or in a doorway. You  may hold on to the railing or door frame as needed for balance. Squeeze your left / right buttock muscles, then lift up your other foot. Do not let your left / right hip push out to the side. It is helpful to stand in front of a mirror for this exercise so you can watch your hip. Hold this position for 10 seconds. Repeat 10 times. Complete this exercise 3-4 times per week   This information is not intended to replace advice given to you by your health care provider. Make sure you discuss any questions you have with your health care provider. Document Revised: 08/30/2021 Document Reviewed: 08/30/2021 Elsevier Patient Education  2024 [employer].     We discussed the possibility of proceeding with an injection.  You can contact the clinic at any time to schedule another appointment.  Continue to take meloxicam as needed  Exercises have been provided  Consider topical treatments such as patches, creams or sprays.

## 2024-09-04 NOTE — Progress Notes (Signed)
 New Patient Visit  Summary: Chad Butler is a 73 y.o. male with the following: 1. Pain in right hip -gluteus tendinopathy greater trochanteric bursitis versus   Assessment & Plan Right greater trochanteric pain syndrome Chronic right hip pain localized laterally, likely bursitis or tendon injury. Previous meloxicam and steroids provided partial relief. - Continue meloxicam. - Consider topical treatments: patches, Icy Hot, lidocaine , cooling/heating creams/sprays. - Consider corticosteroid injection if pain persists. - Consider physical therapy referral if injection and medications are ineffective. - Consider MRI if symptoms persist despite conservative measures.   Follow-up: Return if symptoms worsen or fail to improve.  Subjective:  Chief Complaint  Patient presents with   Hip Pain    Right hip pain     Discussed the use of AI scribe software for clinical note transcription with the patient, who gave verbal consent to proceed.  History of Present Illness Chad Butler is a 73 year old male who presents with right hip pain.  He developed right lateral hip pain on September 18th after feeling a pop while bending down. The pain is intermittent, sometimes severe enough that he must pause before moving. It is mainly triggered by standing up and can occur while sitting. It does not radiate and is confined to the lateral right hip without left hip involvement.  He takes meloxicam with some relief and recently completed a steroid course that he feels helped. He denies groin, buttock, or lower back pain.    Review of Systems: No fevers or chills No numbness or tingling No chest pain No shortness of breath No bowel or bladder dysfunction No GI distress No headaches   Medical History:  Past Medical History:  Diagnosis Date   Anxiety    Bladder cancer (HCC)    Cancer (HCC)    Depression    Hemorrhoids    Hypertension     Past Surgical History:  Procedure  Laterality Date   ADRENALECTOMY     [facility]   CHOLECYSTECTOMY  2001   COLONOSCOPY  11/09/2011   Dr. Harvey: two tubular adenomas, mild diverticulosis, internal hemorrhoids, surveillance in 10 years    COLONOSCOPY WITH PROPOFOL  N/A 03/20/2022   Procedure: COLONOSCOPY WITH PROPOFOL ;  Surgeon: Cindie Carlin POUR, DO;  Location: [facility];  Service: Endoscopy;  Laterality: N/A;  9:30 / ASA 2   CYST EXCISION  1970's   back   CYSTOSCOPY W/ RETROGRADES Bilateral 08/17/2021   Procedure: CYSTOSCOPY WITH RETROGRADE PYELOGRAM;  Surgeon: Sherrilee Belvie CROME, MD;  Location: [facility];  Service: Urology;  Laterality: Bilateral;   CYSTOSCOPY W/ RETROGRADES Left 09/07/2021   Procedure: CYSTOSCOPY WITH RETROGRADE PYELOGRAM;  Surgeon: Sherrilee Belvie CROME, MD;  Location: [facility];  Service: Urology;  Laterality: Left;   CYSTOSCOPY W/ RETROGRADES Left 07/26/2022   Procedure: CYSTOSCOPY WITH RETROGRADE PYELOGRAM- stent removal;  Surgeon: Sherrilee Belvie CROME, MD;  Location: [facility];  Service: Urology;  Laterality: Left;   CYSTOSCOPY W/ URETERAL STENT PLACEMENT Bilateral 01/18/2022   Procedure: CYSTOSCOPY WITH RETROGRADE PYELOGRAM/ BILATERAL URETERAL STENT PLACEMENT;  Surgeon: Sherrilee Belvie CROME, MD;  Location: [facility];  Service: Urology;  Laterality: Bilateral;   ESOPHAGOGASTRODUODENOSCOPY  10/07/2012   Dr. Mavis: normal, Clotest negative   HERNIA REPAIR  1956   INGUINAL HERNIA REPAIR Right 11/04/2013   Procedure: RECURRENT RIGHT INGUINAL HERNIA REPAIR;  Surgeon: Oneil DELENA Mavis, MD;  Location: [facility];  Service: General;  Laterality: Right;   Left shoulder surgery  2011   LESION REMOVAL N/A 11/04/2013  Procedure: EXCISION OF SKIN LESION ABDOMINAL WALL;  Surgeon: Oneil DELENA Budge, MD;  Location: [facility];  Service: General;  Laterality: N/A;   POLYPECTOMY  03/20/2022   Procedure: POLYPECTOMY;  Surgeon: Cindie Carlin POUR, DO;  Location: [facility];  Service: Endoscopy;;   TRANSURETHRAL RESECTION OF BLADDER TUMOR N/A  08/17/2021   Procedure: TRANSURETHRAL RESECTION OF BLADDER TUMOR (TURBT);  Surgeon: Sherrilee Belvie CROME, MD;  Location: [facility];  Service: Urology;  Laterality: N/A;   TRANSURETHRAL RESECTION OF BLADDER TUMOR N/A 01/18/2022   Procedure: TRANSURETHRAL RESECTION OF BLADDER TUMOR (TURBT);  Surgeon: Sherrilee Belvie CROME, MD;  Location: [facility];  Service: Urology;  Laterality: N/A;   URETEROSCOPY Left 09/07/2021   Procedure: URETEROSCOPY-DIAGNOSTIC;  Surgeon: Sherrilee Belvie CROME, MD;  Location: [facility];  Service: Urology;  Laterality: Left;    Family History  Problem Relation Age of Onset   Hypertension Mother    Cancer Father    Colon cancer Neg Hx    Diabetes Neg Hx    Thyroid  disease Neg Hx    Social History   Tobacco Use   Smoking status: Never   Smokeless tobacco: Never  Vaping Use   Vaping status: Never Used  Substance Use Topics   Alcohol use: Yes    Comment: 2 oz wine some days   Drug use: No    Allergies  Allergen Reactions   Red Dye #40 (Allura Red) Nausea And Vomiting   Gadolinium Nausea And Vomiting     Code: VOM, with injection, no other reactions noted, Onset Date: [id number]     Current Meds  Medication Sig   ALPRAZolam (XANAX) 0.5 MG tablet Take 0.5 mg by mouth at bedtime. For sleep.   aspirin  EC 81 MG tablet Take 81 mg by mouth 4 (four) times a week. Swallow whole.   Aspirin -Acetaminophen -Caffeine (GOODY HEADACHE PO) Take 1 packet by mouth daily as needed (headaches.).   benzonatate (TESSALON) 200 MG capsule Take 200 mg by mouth daily as needed for cough.   Cyanocobalamin  (VITAMIN B-12 PO) Take 1 tablet by mouth 3 (three) times a week.   diphenhydrAMINE (BENADRYL) 2 % cream Apply 1 Application topically 3 (three) times daily as needed for itching.   docusate sodium (COLACE) 100 MG capsule Take 100 mg by mouth daily as needed for constipation.   doxylamine, Sleep, (UNISOM) 25 MG tablet Take 12.5 mg by mouth at bedtime as needed for sleep.   gabapentin (NEURONTIN) 300  MG capsule Take 300 mg by mouth 3 (three) times daily.   ibuprofen (ADVIL) 200 MG tablet Take 200 mg by mouth every 8 (eight) hours as needed for headache or moderate pain.   meloxicam (MOBIC) 15 MG tablet Take by mouth.   mirabegron  ER (MYRBETRIQ ) 25 MG TB24 tablet Take 1 tablet (25 mg total) by mouth daily.   Multiple Vitamin (MULTIVITAMIN WITH MINERALS) TABS tablet Take 1 tablet by mouth in the morning. Centrum Silver   nitrofurantoin , macrocrystal-monohydrate, (MACROBID ) 100 MG capsule Take 1 capsule (100 mg total) by mouth every 12 (twelve) hours.   Omega-3 Fatty Acids (FISH OIL PO) Take 2,000 mg by mouth 3 (three) times a week.   omeprazole (PRILOSEC) 40 MG capsule Take 40 mg by mouth daily with lunch.   ondansetron  (ZOFRAN ) 4 MG tablet TAKE 1 TABLET BY MOUTH EVERY 8 HOURS AS NEEDED FOR NAUSEA.   phenazopyridine  (PYRIDIUM ) 100 MG tablet Take 1 tablet (100 mg total) by mouth 3 (three) times daily as needed for pain.  rOPINIRole (REQUIP) 0.5 MG tablet Take 0.5 mg by mouth at bedtime.   silodosin  (RAPAFLO ) 8 MG CAPS capsule Take 1 capsule (8 mg total) by mouth in the morning and at bedtime.   Vibegron  (GEMTESA ) 75 MG TABS Take 1 tablet (75 mg total) by mouth daily.    Objective: BP (!) 145/83   Pulse 78   Ht 6' (1.829 m)   Wt 174 lb (78.9 kg)   BMI 23.60 kg/m   Physical Exam:    General: Alert and oriented. and No acute distress. Gait: Right sided antalgic gait.  Physical Exam MUSCULOSKELETAL: No tenderness in the left and right hips. Right and left legs normal with no pain on straightening. No pain in the lower back.  Tenderness to palpation over the greater trochanter on the right.  No pain with internal or external rotation.  Negative straight leg raise bilaterally.  Excellent strength in bilateral lower extremities.  Mild discomfort with resisted abduction of the right hip.   IMAGING: I personally ordered and reviewed the following images   X-ray of the right hip was  obtained in clinic today.  No acute injuries noted.  Well-maintained joint space.  No evidence of osteophytes.  No subchondral sclerosis.  No subchondral cysts are appreciated.  No reactive bone formation over the greater trochanter laterally.  No bony lesions.  Impression: Negative right hip x-ray     New Medications:  No orders of the defined types were placed in this encounter.     Portions of this note were completed via Scientist, clinical (histocompatibility and immunogenetics).  [name], MD  09/04/2024 9:29 AM

## 2024-10-14 ENCOUNTER — Ambulatory Visit (INDEPENDENT_AMBULATORY_CARE_PROVIDER_SITE_OTHER): Admitting: Orthopedic Surgery

## 2024-10-14 ENCOUNTER — Encounter: Payer: Self-pay | Admitting: Orthopedic Surgery

## 2024-10-14 DIAGNOSIS — G8929 Other chronic pain: Secondary | ICD-10-CM

## 2024-10-14 DIAGNOSIS — M25551 Pain in right hip: Secondary | ICD-10-CM

## 2024-10-14 DIAGNOSIS — M7061 Trochanteric bursitis, right hip: Secondary | ICD-10-CM

## 2024-10-14 NOTE — Progress Notes (Signed)
 Orthopaedic Clinic Return  Assessment & Plan: Chad Butler is a 74 y.o. male with the following: 1. Pain in right hip -greater trochanteric bursitis versus gluteus tendinopathy   Assessment and Plan Assessment & Plan Right greater trochanteric pain syndrome Chronic right lateral hip and buttock pain, likely due to greater trochanteric bursitis or gluteal tendon irritation. No intra-articular hip pathology. Injection indicated for diagnosis and symptom relief. MRI considered if symptoms persist. - Reviewed medication allergies and confirmed no diabetes history before steroid use. - Explained injection may relieve symptoms and clarify pain source. - Discussed MRI if symptoms persist and physical therapy if injection fails.   Procedure note injection - Right lateral hip   Verbal consent was obtained to inject the Right lateral hip.  Patient localized the pain. Timeout was completed to confirm the site of injection.  The skin was prepped with alcohol and ethyl chloride was sprayed at the injection site.  A 21-gauge needle was used to inject 40 mg of Depo-Medrol  and 1% lidocaine  (4 cc) into the Right lateral hip, directly over the localized tenderness using a direct lateral approach.  There were no complications. A sterile bandage was applied.     Follow-up: Return if symptoms worsen or fail to improve.   Subjective:  Chief Complaint  Patient presents with   Hip Pain    R pt states still having a pinching sensation that's staying the same. Pain comes and goes but when it does hit it keeps 5-10 secs.      Discussed the use of AI scribe software for clinical note transcription with the patient, who gave verbal consent to proceed.  History of Present Illness Chad Butler is a 74 year old male with right greater trochanteric pain syndrome who presents for follow-up of persistent right hip and buttock pain.  He has chronic right buttock pain with occasional radiation to the  lateral right hip, unchanged for several months. He describes a bruised, sometimes pinched quality, worse with sitting and certain activities. He can lie on the right side only five to ten minutes before pain becomes bothersome.  When pain flares, it takes five to ten seconds before he can move his leg. He denies weakness or limping. Once he moves the leg, pain improves but persists. He recalls a prior popping sensation in the area.  He uses pain medication intermittently and applies ice and heat with little relief.  He denies numbness, tingling, or pain radiating beyond the right buttock.    Review of Systems: No fevers or chills No numbness or tingling No chest pain No shortness of breath No bowel or bladder dysfunction No GI distress No headaches   Objective: There were no vitals taken for this visit.  Physical Exam:  Physical Exam MUSCULOSKELETAL: Tenderness in right buttock, and the greater trochanter. Right hip joint with normal range of motion.  No pain with internal or external rotation of the right hip.  Negative straight leg raise.  Good lower body strength in the right leg.  Sensation intact to the dorsum of the foot.    IMAGING: I personally ordered and reviewed the following images:   No new imaging obtained today.  Portions of this note were completed with dictation software and mistakes or typos may exist.   [name], MD 10/14/2024 10:13 AM

## 2024-11-03 ENCOUNTER — Ambulatory Visit: Admitting: Orthopedic Surgery

## 2024-11-03 ENCOUNTER — Encounter: Payer: Self-pay | Admitting: Orthopedic Surgery

## 2024-11-03 VITALS — BP 176/91 | HR 97 | Ht 72.0 in | Wt 174.0 lb

## 2024-11-03 DIAGNOSIS — M7918 Myalgia, other site: Secondary | ICD-10-CM

## 2024-11-03 DIAGNOSIS — M25351 Other instability, right hip: Secondary | ICD-10-CM | POA: Diagnosis not present

## 2024-11-03 DIAGNOSIS — M25559 Pain in unspecified hip: Secondary | ICD-10-CM | POA: Insufficient documentation

## 2024-11-03 DIAGNOSIS — G2581 Restless legs syndrome: Secondary | ICD-10-CM | POA: Insufficient documentation

## 2024-11-03 MED ORDER — PREDNISONE 10 MG (21) PO TBPK: ORAL_TABLET | ORAL | 0 refills | Status: AC

## 2024-11-03 NOTE — Progress Notes (Signed)
 Orthopaedic Clinic Return  Assessment & Plan: Chad Butler is a 74 y.o. male with the following: 1. Pain in right hip -greater trochanteric bursitis versus gluteus tendinopathy  Assessment & Plan He continues to have pains in the right hip, in the right buttock.  Recently, severe pain in the right buttock has made it difficult for him to ambulate without difficulty.  He has a Trendelenburg gait.  He does report a pop a few months ago, and since then, he has had worsening pain.  He currently is not having any tenderness over the lateral hip and greater trochanter area.  This injection appears to be helpful.  Nonetheless, he continues to have symptoms.  They are affecting his function.  I have offered him a prednisone  Dosepak.  In addition, I would like to obtain an MRI to assess the right hip, specifically the gluteus tendons for a possible injury that can explain his ongoing symptoms.        Follow-up: Return for After MRI.   Subjective:  Chief Complaint  Patient presents with   Hip Pain    Right when walking in store end of last week had episode of severe 10/10 pain had to wait before he could walk again      Discussed the use of AI scribe software for clinical note transcription with the patient, who gave verbal consent to proceed.  History of Present Illness Chad Butler is a 74 year old male with right greater trochanteric pain syndrome who presents for follow-up of persistent right hip and buttock pain.  I saw him in clinic a couple of weeks ago.  He had a greater trochanteric bursa injection, which has helped with the symptoms directly over the greater trochanter on the right.  However, he has had severe pains in the right buttock.  No recent injury, but he does report feeling a popping sensation in the right hip and buttock area several months ago.  Since then, has had more symptoms.  He recently had an episode while at the grocery store, where he had to stop moving, and  stand still for almost a minute before the pain subsided.  Since then, he has continued to have pain.  It is affecting his mobility.    Review of Systems: No fevers or chills No numbness or tingling No chest pain No shortness of breath No bowel or bladder dysfunction No GI distress No headaches   Objective: BP (!) 176/91   Pulse 97   Ht 6' (1.829 m)   Wt 174 lb (78.9 kg)   BMI 23.60 kg/m   Physical Exam:  Physical Exam MUSCULOSKELETAL: Tenderness in right buttock.  No tenderness over the greater trochanter.  Right hip joint with normal range of motion.  No pain with internal or external rotation of the right hip.  Negative straight leg raise.  He has some weakness and pain with abduction of the right hip.  Sensation intact to the dorsum of the foot.    IMAGING: I personally ordered and reviewed the following images:   No new imaging obtained today.  Portions of this note were completed with dictation software and mistakes or typos may exist.   Chad DELENA Horde, MD 11/03/2024 12:03 PM

## 2024-11-06 ENCOUNTER — Ambulatory Visit (HOSPITAL_COMMUNITY): Admission: RE | Admit: 2024-11-06 | Source: Ambulatory Visit

## 2024-11-06 DIAGNOSIS — M25351 Other instability, right hip: Secondary | ICD-10-CM

## 2024-11-06 DIAGNOSIS — M7918 Myalgia, other site: Secondary | ICD-10-CM

## 2024-12-09 ENCOUNTER — Other Ambulatory Visit: Admitting: Urology
